# Patient Record
Sex: Female | Born: 1940 | Race: White | Hispanic: No | Marital: Married | Smoking: Never smoker
Health system: Southern US, Community
[De-identification: ages and names within clinical notes are randomized; demographics above are authoritative.]

## PROBLEM LIST (undated history)

## (undated) DIAGNOSIS — L309 Dermatitis, unspecified: Secondary | ICD-10-CM

## (undated) DIAGNOSIS — J45909 Unspecified asthma, uncomplicated: Secondary | ICD-10-CM

## (undated) DIAGNOSIS — M1990 Unspecified osteoarthritis, unspecified site: Secondary | ICD-10-CM

## (undated) DIAGNOSIS — R32 Unspecified urinary incontinence: Secondary | ICD-10-CM

## (undated) DIAGNOSIS — R87629 Unspecified abnormal cytological findings in specimens from vagina: Secondary | ICD-10-CM

## (undated) DIAGNOSIS — N952 Postmenopausal atrophic vaginitis: Secondary | ICD-10-CM

## (undated) DIAGNOSIS — E039 Hypothyroidism, unspecified: Secondary | ICD-10-CM

## (undated) DIAGNOSIS — G4730 Sleep apnea, unspecified: Secondary | ICD-10-CM

## (undated) DIAGNOSIS — F329 Major depressive disorder, single episode, unspecified: Secondary | ICD-10-CM

## (undated) DIAGNOSIS — E785 Hyperlipidemia, unspecified: Secondary | ICD-10-CM

## (undated) DIAGNOSIS — J449 Chronic obstructive pulmonary disease, unspecified: Secondary | ICD-10-CM

## (undated) DIAGNOSIS — I10 Essential (primary) hypertension: Secondary | ICD-10-CM

## (undated) DIAGNOSIS — K219 Gastro-esophageal reflux disease without esophagitis: Secondary | ICD-10-CM

## (undated) DIAGNOSIS — N950 Postmenopausal bleeding: Secondary | ICD-10-CM

## (undated) DIAGNOSIS — F32A Depression, unspecified: Secondary | ICD-10-CM

## (undated) HISTORY — DX: Unspecified osteoarthritis, unspecified site: M19.90

## (undated) HISTORY — DX: Unspecified abnormal cytological findings in specimens from vagina: R87.629

## (undated) HISTORY — DX: Depression, unspecified: F32.A

## (undated) HISTORY — DX: Unspecified urinary incontinence: R32

## (undated) HISTORY — PX: CHOLECYSTECTOMY: SHX55

## (undated) HISTORY — DX: Hyperlipidemia, unspecified: E78.5

## (undated) HISTORY — DX: Dermatitis, unspecified: L30.9

## (undated) HISTORY — DX: Major depressive disorder, single episode, unspecified: F32.9

## (undated) HISTORY — DX: Unspecified asthma, uncomplicated: J45.909

## (undated) HISTORY — PX: UMBILICAL HERNIA REPAIR: SHX196

## (undated) HISTORY — DX: Postmenopausal atrophic vaginitis: N95.2

## (undated) HISTORY — DX: Postmenopausal bleeding: N95.0

## (undated) HISTORY — DX: Chronic obstructive pulmonary disease, unspecified: J44.9

## (undated) HISTORY — DX: Essential (primary) hypertension: I10

## (undated) HISTORY — DX: Morbid (severe) obesity due to excess calories: E66.01

## (undated) HISTORY — DX: Hypothyroidism, unspecified: E03.9

## (undated) HISTORY — PX: DILATION AND CURETTAGE OF UTERUS: SHX78

## (undated) HISTORY — DX: Sleep apnea, unspecified: G47.30

## (undated) HISTORY — DX: Gastro-esophageal reflux disease without esophagitis: K21.9

---
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
## 2001-06-12 ENCOUNTER — Ambulatory Visit (HOSPITAL_COMMUNITY): Admission: RE | Admit: 2001-06-12 | Discharge: 2001-06-12 | Payer: Self-pay | Admitting: Gastroenterology

## 2003-12-05 ENCOUNTER — Ambulatory Visit: Payer: Self-pay | Admitting: Internal Medicine

## 2004-02-24 ENCOUNTER — Ambulatory Visit: Payer: Self-pay | Admitting: Internal Medicine

## 2004-03-06 ENCOUNTER — Ambulatory Visit: Payer: Self-pay | Admitting: Internal Medicine

## 2005-03-25 ENCOUNTER — Emergency Department: Payer: Self-pay | Admitting: General Practice

## 2005-04-25 ENCOUNTER — Encounter: Payer: Self-pay | Admitting: Physician Assistant

## 2005-05-04 ENCOUNTER — Encounter: Payer: Self-pay | Admitting: Physician Assistant

## 2005-05-08 ENCOUNTER — Emergency Department: Payer: Self-pay | Admitting: Emergency Medicine

## 2005-05-18 ENCOUNTER — Ambulatory Visit: Payer: Self-pay | Admitting: Internal Medicine

## 2005-08-11 ENCOUNTER — Ambulatory Visit: Payer: Self-pay | Admitting: Unknown Physician Specialty

## 2005-08-11 LAB — HM COLONOSCOPY

## 2006-08-30 ENCOUNTER — Ambulatory Visit: Payer: Self-pay | Admitting: Internal Medicine

## 2007-09-13 ENCOUNTER — Ambulatory Visit: Payer: Self-pay | Admitting: Internal Medicine

## 2008-11-04 ENCOUNTER — Ambulatory Visit: Payer: Self-pay | Admitting: Internal Medicine

## 2009-12-03 ENCOUNTER — Ambulatory Visit: Payer: Self-pay | Admitting: Internal Medicine

## 2009-12-03 LAB — HM MAMMOGRAPHY

## 2011-02-21 ENCOUNTER — Ambulatory Visit: Payer: Self-pay | Admitting: Internal Medicine

## 2011-08-29 LAB — LIPID PANEL
Cholesterol: 167 mg/dL (ref 0–200)
HDL: 48 mg/dL (ref 35–70)
LDL Cholesterol: 95 mg/dL
Triglycerides: 121 mg/dL (ref 40–160)

## 2011-08-29 LAB — HEPATIC FUNCTION PANEL
ALT: 18 U/L (ref 7–35)
AST: 20 U/L (ref 13–35)
Alkaline Phosphatase: 58 U/L (ref 25–125)
Bilirubin, Total: 0.9 mg/dL

## 2011-08-29 LAB — BASIC METABOLIC PANEL
Creatinine: 1 mg/dL (ref <=1.1)
Glucose: 107 mg/dL
Potassium: 4.3 mmol/L (ref 3.4–5.3)

## 2011-09-18 ENCOUNTER — Observation Stay: Payer: Self-pay | Admitting: Internal Medicine

## 2011-09-18 LAB — URINALYSIS, COMPLETE
Bilirubin,UR: NEGATIVE
Blood: NEGATIVE
Glucose,UR: NEGATIVE mg/dL (ref 0–75)
Ketone: NEGATIVE
Nitrite: POSITIVE
Ph: 7 (ref 4.5–8.0)
Protein: NEGATIVE
RBC,UR: 1 /HPF (ref 0–5)
Specific Gravity: 1.005 (ref 1.003–1.030)
Squamous Epithelial: 3
WBC UR: 27 /HPF (ref 0–5)

## 2011-09-18 LAB — BASIC METABOLIC PANEL
Anion Gap: 13 (ref 7–16)
BUN: 11 mg/dL (ref 7–18)
Calcium, Total: 9.5 mg/dL (ref 8.5–10.1)
Chloride: 103 mmol/L (ref 98–107)
Co2: 26 mmol/L (ref 21–32)
Creatinine: 0.98 mg/dL (ref 0.60–1.30)
EGFR (African American): >60
EGFR (Non-African Amer.): 58 — ABNORMAL LOW
Glucose: 104 mg/dL — ABNORMAL HIGH (ref 65–99)
Osmolality: 283 (ref 275–301)
Potassium: 3.9 mmol/L (ref 3.5–5.1)
Sodium: 142 mmol/L (ref 136–145)

## 2011-09-18 LAB — CBC
HCT: 38.8 % (ref 35.0–47.0)
HGB: 12.9 g/dL (ref 12.0–16.0)
MCH: 31.1 pg (ref 26.0–34.0)
MCHC: 33.3 g/dL (ref 32.0–36.0)
MCV: 93 fL (ref 80–100)
Platelet: 264 10*3/uL (ref 150–440)
RBC: 4.16 10*6/uL (ref 3.80–5.20)
RDW: 13.6 % (ref 11.5–14.5)
WBC: 9.9 10*3/uL (ref 3.6–11.0)

## 2011-09-18 LAB — TSH: Thyroid Stimulating Horm: 2.94 u[IU]/mL

## 2011-09-19 LAB — BASIC METABOLIC PANEL
Anion Gap: 7 (ref 7–16)
BUN: 10 mg/dL (ref 7–18)
Calcium, Total: 8.7 mg/dL (ref 8.5–10.1)
Chloride: 105 mmol/L (ref 98–107)
Co2: 29 mmol/L (ref 21–32)
Creatinine: 1.15 mg/dL (ref 0.60–1.30)
EGFR (African American): 56 — ABNORMAL LOW
EGFR (Non-African Amer.): 48 — ABNORMAL LOW
Glucose: 104 mg/dL — ABNORMAL HIGH (ref 65–99)
Osmolality: 281 (ref 275–301)
Potassium: 3.8 mmol/L (ref 3.5–5.1)
Sodium: 141 mmol/L (ref 136–145)

## 2011-09-19 LAB — CBC WITH DIFFERENTIAL/PLATELET
Basophil #: 0.1 10*3/uL (ref 0.0–0.1)
Basophil %: 0.6 %
Eosinophil #: 0.6 10*3/uL (ref 0.0–0.7)
Eosinophil %: 5 %
HCT: 39 % (ref 35.0–47.0)
HGB: 12.6 g/dL (ref 12.0–16.0)
Lymphocyte #: 1.1 10*3/uL (ref 1.0–3.6)
Lymphocyte %: 9.1 %
MCH: 30.5 pg (ref 26.0–34.0)
MCHC: 32.3 g/dL (ref 32.0–36.0)
MCV: 94 fL (ref 80–100)
Monocyte #: 0.8 x10 3/mm (ref 0.2–0.9)
Monocyte %: 6.7 %
Neutrophil #: 9.4 10*3/uL — ABNORMAL HIGH (ref 1.4–6.5)
Neutrophil %: 78.6 %
Platelet: 249 10*3/uL (ref 150–440)
RBC: 4.13 10*6/uL (ref 3.80–5.20)
RDW: 13.6 % (ref 11.5–14.5)
WBC: 12 10*3/uL — ABNORMAL HIGH (ref 3.6–11.0)

## 2011-09-24 LAB — CULTURE, BLOOD (SINGLE)

## 2012-01-27 ENCOUNTER — Telehealth: Payer: Self-pay | Admitting: Internal Medicine

## 2012-01-27 MED ORDER — AMLODIPINE BESYLATE 5 MG PO TABS: 5.0000 mg | ORAL_TABLET | Freq: Every day | ORAL | Status: DC

## 2012-01-27 NOTE — Telephone Encounter (Signed)
rx sent to [facility] for amlodipine 5mg  #90 with 1 refill

## 2012-02-15 ENCOUNTER — Telehealth: Payer: Self-pay | Admitting: Internal Medicine

## 2012-02-15 NOTE — Telephone Encounter (Signed)
Refill request for furosemide 20 mg tab #90 Sig: take 1 tablet by mouth once daily Patient has an appt. For 04/16/12

## 2012-02-16 ENCOUNTER — Other Ambulatory Visit: Payer: Self-pay | Admitting: *Deleted

## 2012-02-16 MED ORDER — FUROSEMIDE 20 MG PO TABS: 20.0000 mg | ORAL_TABLET | Freq: Every day | ORAL | Status: DC

## 2012-02-16 NOTE — Telephone Encounter (Signed)
Pt called stating that she needs her refill for Lasix called into [facility], she only has one pill left.

## 2012-02-16 NOTE — Telephone Encounter (Signed)
Furosemide 20 mg Tab #90 Take one tablet po once daily

## 2012-02-16 NOTE — Telephone Encounter (Signed)
Dr.Daven Montz prescribed both of these medications. Spoke with patient and advised patient to call Dr. Garen Lah office.

## 2012-02-16 NOTE — Telephone Encounter (Signed)
Sent in to pharmacy.  

## 2012-02-29 ENCOUNTER — Other Ambulatory Visit: Payer: Self-pay | Admitting: Internal Medicine

## 2012-02-29 MED ORDER — POTASSIUM CHLORIDE CRYS ER 20 MEQ PO TBCR: 20.0000 meq | EXTENDED_RELEASE_TABLET | Freq: Every day | ORAL | Status: DC

## 2012-02-29 NOTE — Telephone Encounter (Signed)
Pt is needing refill on her Potassium sent into [facility]. Best contact number is [phone]

## 2012-02-29 NOTE — Telephone Encounter (Signed)
Refill request for potassium chloride 20 meq ter #30 Sig: take 1 tablet by mouth once daily with furosemide.

## 2012-02-29 NOTE — Telephone Encounter (Signed)
Sent in to pharmacy.  

## 2012-03-06 HISTORY — PX: ABDOMINAL HYSTERECTOMY: SHX81

## 2012-03-13 ENCOUNTER — Telehealth: Payer: Self-pay | Admitting: Internal Medicine

## 2012-03-13 ENCOUNTER — Other Ambulatory Visit: Payer: Self-pay

## 2012-03-13 LAB — POCT URINALYSIS DIPSTICK
Bilirubin, UA: NEGATIVE
Blood, UA: NEGATIVE
Glucose, UA: NEGATIVE
Ketones, UA: NEGATIVE
Leukocytes, UA: NEGATIVE
Nitrite, UA: NEGATIVE
Protein, UA: NEGATIVE
Spec Grav, UA: <=1.005
Urobilinogen, UA: 0.2
pH, UA: 5.5

## 2012-03-13 NOTE — Telephone Encounter (Signed)
Patient notified that she will need to go to an urgent care to be seen if she thinks that she has a UTI per Dr. Lorin Picket.I. Advised patient that she will need to get established at the office before she can be treated for any medical conditions. Advised patient that the office policy is that if patient's think that they have a UTI they will need to be seen and can not drop off a urine specimen.  Patient states that she has an upcoming appointment scheduled and she was encouraged to keep that appointment.

## 2012-03-13 NOTE — Telephone Encounter (Signed)
Patient Information:  Caller Name: Leydy  Phone: [phone]  Patient: [name], [name]  Gender: Female  DOB: 1940/05/11  Age: 72 Years  PCP: Dale Wilson  Office Follow Up:  Does the office need to follow up with this patient?: No  Instructions For The Office: N/A  RN Note:  caller states she cannot come into the office; but she will have her husband bring by a urine specimen (pt has sterile urine container at home)  Symptoms  Reason For Call & Symptoms: caller states she has dark urine and it burns/painful when she urinates  Reviewed Health History In EMR: Yes  Reviewed Medications In EMR: Yes  Reviewed Allergies In EMR: Yes  Reviewed Surgeries / Procedures: Yes  Date of Onset of Symptoms: 02/11/2012  Guideline(s) Used:  Urination Pain - Female  Disposition Per Guideline:   See Today in Office  Reason For Disposition Reached:   Age > 50 years  Advice Given:  Fluids:   Drink extra fluids. Drink 8-10 glasses of liquids a day. (Reason: to produce a dilute, non-irritating urine)  Cranberry Juice:   Some people think that drinking cranberry juice may help in fighting urinary tract infections. However, there is no good research that has ever proved this.  Call Back If:   You become worse.  Patient Refused Recommendation:  Patient Refused Care Advice  caller cannot come into office; spouse will bring urine specimen by today (03/13/12)

## 2012-03-13 NOTE — Telephone Encounter (Signed)
Forward to Dr. Scott 

## 2012-03-13 NOTE — Telephone Encounter (Signed)
Please call pt and discuss what we discussed

## 2012-03-18 ENCOUNTER — Telehealth: Payer: Self-pay | Admitting: Internal Medicine

## 2012-03-18 NOTE — Telephone Encounter (Signed)
Results from urine specimen.

## 2012-03-20 NOTE — Telephone Encounter (Signed)
Do we have urine specimen results for this patient? Please advise?

## 2012-03-20 NOTE — Telephone Encounter (Signed)
Left message on machine to call back  

## 2012-03-20 NOTE — Telephone Encounter (Signed)
This is the pt you spoke to last week about needing to go to urgent care for evaluation because she had not established here.  You also explained to her that we did not treat over the phone without seeing a pt.  Please inform her again.  I am now sure why she is calling for results.  Thanks.

## 2012-03-21 NOTE — Telephone Encounter (Signed)
Spoke to patient and reminded her of the conversation last week that we could not do a urine test on her since she has not establish here per Dr. Lorin Picket. Patient stated that she was confused because when her husband dropped off the urine it was accepted. Advised patient that the urine was dropped off before our conversation. Patient stated that she will go to an urgent care for treatment. Advised patient to keep her upcoming appointment to get established. Patient was added to a waiting list by Asher Muir T. In case Dr. Lorin Picket has a cancellation and patient can be seen sooner.

## 2012-04-11 ENCOUNTER — Telehealth: Payer: Self-pay | Admitting: Internal Medicine

## 2012-04-11 NOTE — Telephone Encounter (Signed)
Patient wants to know if she should fast on her appointment date 2.11.14  in order to have blood work done.

## 2012-04-12 ENCOUNTER — Encounter: Payer: Self-pay | Admitting: *Deleted

## 2012-04-15 ENCOUNTER — Telehealth: Payer: Self-pay | Admitting: Internal Medicine

## 2012-04-15 NOTE — Telephone Encounter (Signed)
Patient wants to know if she should fast before coming to her appointment on 2.11.14.

## 2012-04-15 NOTE — Telephone Encounter (Signed)
I looked in patient's chart but I did not see where she has labs due.

## 2012-04-16 ENCOUNTER — Ambulatory Visit (INDEPENDENT_AMBULATORY_CARE_PROVIDER_SITE_OTHER): Payer: Medicare Other | Admitting: Internal Medicine

## 2012-04-16 ENCOUNTER — Encounter: Payer: Self-pay | Admitting: Internal Medicine

## 2012-04-16 VITALS — BP 130/74 | HR 73 | Temp 98.2°F | Ht 66.0 in | Wt 367.0 lb

## 2012-04-16 DIAGNOSIS — K5730 Diverticulosis of large intestine without perforation or abscess without bleeding: Secondary | ICD-10-CM

## 2012-04-16 DIAGNOSIS — N3281 Overactive bladder: Secondary | ICD-10-CM

## 2012-04-16 DIAGNOSIS — N318 Other neuromuscular dysfunction of bladder: Secondary | ICD-10-CM

## 2012-04-16 DIAGNOSIS — R300 Dysuria: Secondary | ICD-10-CM

## 2012-04-16 DIAGNOSIS — I10 Essential (primary) hypertension: Secondary | ICD-10-CM

## 2012-04-16 DIAGNOSIS — R5381 Other malaise: Secondary | ICD-10-CM

## 2012-04-16 DIAGNOSIS — C541 Malignant neoplasm of endometrium: Secondary | ICD-10-CM

## 2012-04-16 DIAGNOSIS — E7800 Pure hypercholesterolemia, unspecified: Secondary | ICD-10-CM

## 2012-04-16 DIAGNOSIS — J45909 Unspecified asthma, uncomplicated: Secondary | ICD-10-CM

## 2012-04-16 DIAGNOSIS — C549 Malignant neoplasm of corpus uteri, unspecified: Secondary | ICD-10-CM

## 2012-04-16 DIAGNOSIS — G4733 Obstructive sleep apnea (adult) (pediatric): Secondary | ICD-10-CM

## 2012-04-16 DIAGNOSIS — I050 Rheumatic mitral stenosis: Secondary | ICD-10-CM

## 2012-04-16 DIAGNOSIS — E039 Hypothyroidism, unspecified: Secondary | ICD-10-CM

## 2012-04-16 LAB — URINALYSIS, ROUTINE W REFLEX MICROSCOPIC
Bilirubin Urine: NEGATIVE
Hgb urine dipstick: NEGATIVE
Ketones, ur: NEGATIVE
Leukocytes, UA: NEGATIVE
Nitrite: NEGATIVE
Specific Gravity, Urine: 1.02 (ref 1.000–1.030)
Total Protein, Urine: NEGATIVE
Urine Glucose: NEGATIVE
Urobilinogen, UA: 0.2 (ref 0.0–1.0)
pH: 6 (ref 5.0–8.0)

## 2012-04-16 LAB — CBC WITH DIFFERENTIAL/PLATELET
Basophils Absolute: 0.1 10*3/uL (ref 0.0–0.1)
Basophils Relative: 0.9 % (ref 0.0–3.0)
Eosinophils Absolute: 0.1 10*3/uL (ref 0.0–0.7)
Eosinophils Relative: 1.1 % (ref 0.0–5.0)
HCT: 41.3 % (ref 36.0–46.0)
Hemoglobin: 14.1 g/dL (ref 12.0–15.0)
Lymphocytes Relative: 16.2 % (ref 12.0–46.0)
Lymphs Abs: 1.3 10*3/uL (ref 0.7–4.0)
MCHC: 34 g/dL (ref 30.0–36.0)
MCV: 92.1 fl (ref 78.0–100.0)
Monocytes Absolute: 0.4 10*3/uL (ref 0.1–1.0)
Monocytes Relative: 4.9 % (ref 3.0–12.0)
Neutro Abs: 6.4 10*3/uL (ref 1.4–7.7)
Neutrophils Relative %: 76.9 % (ref 43.0–77.0)
Platelets: 290 10*3/uL (ref 150.0–400.0)
RBC: 4.49 Mil/uL (ref 3.87–5.11)
RDW: 13.4 % (ref 11.5–14.6)
WBC: 8.3 10*3/uL (ref 4.5–10.5)

## 2012-04-16 LAB — COMPREHENSIVE METABOLIC PANEL
ALT: 19 U/L (ref 0–35)
AST: 23 U/L (ref 0–37)
Albumin: 4.3 g/dL (ref 3.5–5.2)
Alkaline Phosphatase: 63 U/L (ref 39–117)
BUN: 18 mg/dL (ref 6–23)
CO2: 29 mEq/L (ref 19–32)
Calcium: 10.2 mg/dL (ref 8.4–10.5)
Chloride: 101 mEq/L (ref 96–112)
Creatinine, Ser: 1.1 mg/dL (ref 0.4–1.2)
GFR: 53.09 mL/min — ABNORMAL LOW (ref >60.00)
Glucose, Bld: 106 mg/dL — ABNORMAL HIGH (ref 70–99)
Potassium: 5.2 mEq/L — ABNORMAL HIGH (ref 3.5–5.1)
Sodium: 137 mEq/L (ref 135–145)
Total Bilirubin: 1.1 mg/dL (ref 0.3–1.2)
Total Protein: 8 g/dL (ref 6.0–8.3)

## 2012-04-16 LAB — LIPID PANEL
Cholesterol: 182 mg/dL (ref 0–200)
HDL: 49.5 mg/dL (ref >39.00)
LDL Cholesterol: 98 mg/dL (ref 0–99)
Total CHOL/HDL Ratio: 4
Triglycerides: 175 mg/dL — ABNORMAL HIGH (ref 0.0–149.0)
VLDL: 35 mg/dL (ref 0.0–40.0)

## 2012-04-16 LAB — TSH: TSH: 3.31 u[IU]/mL (ref 0.35–5.50)

## 2012-04-16 MED ORDER — MOMETASONE FUROATE 220 MCG/INH IN AEPB: 1.0000 | INHALATION_SPRAY | Freq: Two times a day (BID) | RESPIRATORY_TRACT | Status: DC

## 2012-04-17 ENCOUNTER — Other Ambulatory Visit: Payer: Self-pay | Admitting: Internal Medicine

## 2012-04-17 DIAGNOSIS — R739 Hyperglycemia, unspecified: Secondary | ICD-10-CM

## 2012-04-17 DIAGNOSIS — E875 Hyperkalemia: Secondary | ICD-10-CM

## 2012-04-17 NOTE — Progress Notes (Signed)
Orders placed for follow up labs 

## 2012-04-18 ENCOUNTER — Encounter: Payer: Self-pay | Admitting: Internal Medicine

## 2012-04-18 DIAGNOSIS — E039 Hypothyroidism, unspecified: Secondary | ICD-10-CM | POA: Insufficient documentation

## 2012-04-18 DIAGNOSIS — I350 Nonrheumatic aortic (valve) stenosis: Secondary | ICD-10-CM | POA: Insufficient documentation

## 2012-04-18 DIAGNOSIS — C541 Malignant neoplasm of endometrium: Secondary | ICD-10-CM | POA: Insufficient documentation

## 2012-04-18 DIAGNOSIS — E7800 Pure hypercholesterolemia, unspecified: Secondary | ICD-10-CM | POA: Insufficient documentation

## 2012-04-18 DIAGNOSIS — K5730 Diverticulosis of large intestine without perforation or abscess without bleeding: Secondary | ICD-10-CM | POA: Insufficient documentation

## 2012-04-18 DIAGNOSIS — J45909 Unspecified asthma, uncomplicated: Secondary | ICD-10-CM | POA: Insufficient documentation

## 2012-04-18 DIAGNOSIS — I10 Essential (primary) hypertension: Secondary | ICD-10-CM | POA: Insufficient documentation

## 2012-04-18 DIAGNOSIS — N3281 Overactive bladder: Secondary | ICD-10-CM | POA: Insufficient documentation

## 2012-04-18 DIAGNOSIS — G4733 Obstructive sleep apnea (adult) (pediatric): Secondary | ICD-10-CM | POA: Insufficient documentation

## 2012-04-18 NOTE — Assessment & Plan Note (Signed)
S/p hysterectomy.  Overdue gyn follow up.  Plans to make her appt with Dr Toya Smothers.

## 2012-04-18 NOTE — Assessment & Plan Note (Signed)
ECHO 09/02/08 revealed normal LV function with EF 55-60%, mild stenotic aortic valve.  Mildly elevated right sided  Heart pressure.  Treat sleep apnea.

## 2012-04-18 NOTE — Progress Notes (Signed)
Subjective:    Patient ID: [name], female    DOB: December 08, 1940, 72 y.o.   MRN: [id number]  HPI 72 year old female with past history of hypertension, hypothyroidism, hypercholesterolemia, endometrial cancer and asthma who comes in today for a scheduled follow up.  She states she is doing relatively well.  She underwent surgery for endometrial cancer.  Do not have records.  States she is doing well.  Overdue follow up with gyn.  Saw Dr Kyla Balzarine - did her surgery.  Plans to follow up with Dr Toya Smothers.  Noticing some incontinence.  3.5 weeks ago, went to [facility] [employer].  Dx with a uti.  Treated with cipro.  Helped.  Feels she may have some return of symptoms.  Her niece is wrapping her legs.  Swelling is better.  Does report some increased drainage.  Some congestion in her chest.  Has asthma.  Not using inhalers regularly.  Overall, things appear to be stable.    Past Medical History  Diagnosis Date  . Hypothyroidism   . Hypertension   . Hyperlipidemia   . Sleep apnea   . Morbid obesity   . Osteoarthritis   . Asthma   . Osteoarthritis     Current Outpatient Prescriptions on File Prior to Visit  Medication Sig Dispense Refill  . albuterol (PROVENTIL) (2.5 MG/3ML) 0.083% nebulizer solution Take 2.5 mg by nebulization every 6 (six) hours as needed.      Marland Kitchen amLODipine (NORVASC) 5 MG tablet Take 1 tablet (5 mg total) by mouth daily.  90 tablet  1  . aspirin 81 MG tablet Take 81 mg by mouth daily.      Marland Kitchen atorvastatin (LIPITOR) 20 MG tablet Take 20 mg by mouth daily.      . Calcium Carbonate-Vitamin D (CALCIUM 600 + D PO) Take by mouth 2 (two) times daily.       . furosemide (LASIX) 20 MG tablet Take 1 tablet (20 mg total) by mouth daily.  90 tablet  0  . Garlic 1000 MG CAPS Take by mouth.      . levothyroxine (SYNTHROID, LEVOTHROID) 100 MCG tablet Take 100 mcg by mouth daily.      . Misc Natural Products (OSTEO BI-FLEX JOINT SHIELD PO) Take by mouth. Use as directed      . potassium  chloride SA (K-DUR,KLOR-CON) 20 MEQ tablet Take 1 tablet (20 mEq total) by mouth daily.  30 tablet  1  . albuterol (PROVENTIL HFA;VENTOLIN HFA) 108 (90 BASE) MCG/ACT inhaler Inhale 2 puffs into the lungs every 6 (six) hours as needed.      . fluticasone (FLONASE) 50 MCG/ACT nasal spray Place 2 sprays into the nose daily.      . potassium chloride (KLOR-CON) 20 MEQ packet Take 20 mEq by mouth daily.       . solifenacin (VESICARE) 5 MG tablet Take 10 mg by mouth daily.       No current facility-administered medications on file prior to visit.    Review of Systems Patient denies any headache, lightheadedness or dizziness.  No sinus pressure or nasal congestion.  No chest pain, tightness or palpitations.  No increased shortness of breath.  Some cough and congestion.   No nausea or vomiting.  No abdominal pain or cramping.  No bowel change, such as diarrhea, constipation, BRBPR or melana.  Urine change as outlined.  Discussed the need to get up and move around.        Objective:   Physical Exam  Filed Vitals:   04/16/12 1051  BP: 130/74  Pulse: 73  Temp: 98.2 F (52.50 C)   72 year old female in no acute distress.   HEENT:  Nares- clear.  Oropharynx - without lesions. NECK:  Supple.  Nontender.  No audible bruit.  HEART:  Appears to be regular. LUNGS:  No crackles or wheezing audible.  Respirations even and unlabored.  RADIAL PULSE:  Equal bilaterally.  ABDOMEN:  Soft, nontender.  Bowel sounds present and normal.  No audible abdominal bruit.  EXTREMITIES:  No increased edema present.  DP pulses palpable and equal bilaterally.           Assessment & Plan:  FATIGUE.  Check cbc, met c and tsh.    HEALTH MAINTENANCE.  Breast, pelvic and paps through gyn.  Need to schedule mammogram.  Colonoscopy 08/11/05 revealed one 5mm polyp in the ascending colon with internal hemorrhoid and a 10mm polyp in the rectum and diverticulosis.  Overdue colonoscopy.  She was referred to GI.  They had wanted her to  lose weight prior to her procedure.  She declines further testing now.

## 2012-04-18 NOTE — Assessment & Plan Note (Signed)
Low cholesterol diet.  On lipitor.  Follow lipid panel and liver function.

## 2012-04-18 NOTE — Assessment & Plan Note (Signed)
Stable.  Asymptomatic.  Declines colonoscopy.  See previous notes.

## 2012-04-18 NOTE — Assessment & Plan Note (Signed)
Check urine to confirm no infection.  

## 2012-04-18 NOTE — Assessment & Plan Note (Signed)
Needs to use her CPAP

## 2012-04-18 NOTE — Assessment & Plan Note (Signed)
Has seen Dr Meredeth Ide.  He had recommended a follow up chest CT.  She is overdue.  She has declined further scanning.  Lungs clear.  Restart Advair.  Use the albuterol inhaler if needed.  Follow.

## 2012-04-18 NOTE — Assessment & Plan Note (Signed)
Hypercholesterolemia

## 2012-04-18 NOTE — Assessment & Plan Note (Signed)
On thyroid replacement.  Check tsh.  

## 2012-04-22 ENCOUNTER — Other Ambulatory Visit: Payer: Self-pay | Admitting: Internal Medicine

## 2012-04-22 DIAGNOSIS — R3915 Urgency of urination: Secondary | ICD-10-CM

## 2012-04-22 NOTE — Progress Notes (Signed)
Order placed for follow up urine check

## 2012-04-25 ENCOUNTER — Other Ambulatory Visit (INDEPENDENT_AMBULATORY_CARE_PROVIDER_SITE_OTHER): Payer: Medicare Other

## 2012-04-25 DIAGNOSIS — R3915 Urgency of urination: Secondary | ICD-10-CM

## 2012-04-25 DIAGNOSIS — E875 Hyperkalemia: Secondary | ICD-10-CM

## 2012-04-25 DIAGNOSIS — R7309 Other abnormal glucose: Secondary | ICD-10-CM

## 2012-04-25 DIAGNOSIS — R739 Hyperglycemia, unspecified: Secondary | ICD-10-CM

## 2012-04-25 LAB — URINALYSIS, ROUTINE W REFLEX MICROSCOPIC
Bilirubin Urine: NEGATIVE
Hgb urine dipstick: NEGATIVE
Ketones, ur: NEGATIVE
Leukocytes, UA: NEGATIVE
Nitrite: NEGATIVE
Specific Gravity, Urine: 1.015 (ref 1.000–1.030)
Total Protein, Urine: NEGATIVE
Urine Glucose: NEGATIVE
Urobilinogen, UA: 0.2 (ref 0.0–1.0)
pH: 6 (ref 5.0–8.0)

## 2012-04-25 LAB — HEMOGLOBIN A1C: Hgb A1c MFr Bld: 6.1 % (ref 4.6–6.5)

## 2012-04-25 LAB — GLUCOSE, RANDOM: Glucose, Bld: 101 mg/dL — ABNORMAL HIGH (ref 70–99)

## 2012-04-25 LAB — POTASSIUM: Potassium: 4.3 mEq/L (ref 3.5–5.1)

## 2012-04-27 ENCOUNTER — Telehealth: Payer: Self-pay | Admitting: Internal Medicine

## 2012-04-27 LAB — CULTURE, URINE COMPREHENSIVE: Colony Count: >=100000

## 2012-04-27 MED ORDER — LEVOTHYROXINE SODIUM 100 MCG PO TABS: 100.0000 ug | ORAL_TABLET | Freq: Every day | ORAL | Status: DC

## 2012-04-27 NOTE — Telephone Encounter (Signed)
Order placed for refill on levothyroxine

## 2012-04-30 ENCOUNTER — Telehealth: Payer: Self-pay | Admitting: Internal Medicine

## 2012-04-30 MED ORDER — POTASSIUM CHLORIDE CRYS ER 20 MEQ PO TBCR: 20.0000 meq | EXTENDED_RELEASE_TABLET | Freq: Every day | ORAL | Status: DC

## 2012-04-30 MED ORDER — CEFUROXIME AXETIL 250 MG PO TABS: 250.0000 mg | ORAL_TABLET | Freq: Two times a day (BID) | ORAL | Status: DC

## 2012-04-30 NOTE — Telephone Encounter (Signed)
Informed patient of lab results and rx sent to pharmacy on file.

## 2012-04-30 NOTE — Telephone Encounter (Signed)
Message copied by Theola Sequin on Tue Apr 30, 2012  4:38 PM ------      Message from: Charm Barges      Created: Fri Apr 26, 2012  6:00 AM       Notify pt urinalysis looks good.  Potassium is normal.  Overall sugar control is just slightly elevated.  Low carb diet, exercise and weight loss.  Will follow. ------

## 2012-04-30 NOTE — Telephone Encounter (Signed)
Patient returning your call .[phone]

## 2012-06-19 NOTE — Telephone Encounter (Signed)
Please close encounter

## 2012-06-30 ENCOUNTER — Telehealth: Payer: Self-pay | Admitting: Internal Medicine

## 2012-06-30 MED ORDER — POTASSIUM CHLORIDE CRYS ER 20 MEQ PO TBCR: 20.0000 meq | EXTENDED_RELEASE_TABLET | Freq: Every day | ORAL | Status: DC

## 2012-06-30 NOTE — Telephone Encounter (Signed)
Refilled potassium #30 with one refill.

## 2012-07-17 ENCOUNTER — Other Ambulatory Visit: Payer: Self-pay | Admitting: *Deleted

## 2012-07-18 MED ORDER — ATORVASTATIN CALCIUM 20 MG PO TABS: 20.0000 mg | ORAL_TABLET | Freq: Every day | ORAL | Status: DC

## 2012-07-29 ENCOUNTER — Other Ambulatory Visit: Payer: Self-pay | Admitting: Internal Medicine

## 2012-07-29 MED ORDER — POTASSIUM CHLORIDE CRYS ER 20 MEQ PO TBCR: 20.0000 meq | EXTENDED_RELEASE_TABLET | Freq: Every day | ORAL | Status: DC

## 2012-07-29 MED ORDER — AMLODIPINE BESYLATE 5 MG PO TABS: 5.0000 mg | ORAL_TABLET | Freq: Every day | ORAL | Status: DC

## 2012-07-29 NOTE — Progress Notes (Signed)
Refilled amlodipine #90 with one refill and potassium #30 with one refill.  Needs a f/u appt.

## 2012-08-11 ENCOUNTER — Other Ambulatory Visit: Payer: Self-pay | Admitting: Internal Medicine

## 2012-08-11 MED ORDER — FUROSEMIDE 20 MG PO TABS: 20.0000 mg | ORAL_TABLET | Freq: Every day | ORAL | Status: DC

## 2012-08-11 NOTE — Progress Notes (Signed)
Refilled lasix #90 with no refills.

## 2012-08-13 ENCOUNTER — Telehealth: Payer: Self-pay | Admitting: *Deleted

## 2012-08-13 NOTE — Telephone Encounter (Signed)
Pt received a letter from her [facility] stating that she was eligible for a free in-home visit/physical at no cost to her. The letter mentioned that a physician & nurse would complete the exam & would sent the report to her primary care physician. Pt wanted to know how you feel about it. Does it seem reasonable. She has already had a physical with you & was wondering if this is legit.  FYI: Has also been seen at [facility] smear revealed abnormal cell  Had 2 molds removed by Dr. Orson Aloe last week-hasn't heard the results yet & plan to call them tomorrow.

## 2012-08-13 NOTE — Telephone Encounter (Signed)
Patient left a message on voicemail stating she would like someone to call her back.

## 2012-08-13 NOTE — Telephone Encounter (Signed)
Given her history, I feel she needs the gyn exam and her gyn.  It appears she is doing this. If she decides to have them come out, we will still need to have f/u office visit here in the office.  This would not take the place of this.

## 2012-08-14 NOTE — Telephone Encounter (Signed)
Pt informed

## 2012-09-23 ENCOUNTER — Other Ambulatory Visit: Payer: Self-pay | Admitting: *Deleted

## 2012-09-24 MED ORDER — POTASSIUM CHLORIDE CRYS ER 20 MEQ PO TBCR: 20.0000 meq | EXTENDED_RELEASE_TABLET | Freq: Every day | ORAL | Status: DC

## 2012-10-18 ENCOUNTER — Telehealth: Payer: Self-pay | Admitting: Internal Medicine

## 2012-10-18 NOTE — Telephone Encounter (Signed)
noted 

## 2012-10-18 NOTE — Telephone Encounter (Signed)
It is not that I mind doing the referral, but if they did the test she needs to contact them about appt.  I don't know who they were wanting her to see.

## 2012-10-18 NOTE — Telephone Encounter (Signed)
Spoke with [facility] & was informed that they faxed her records &  Letter to [facility] oncology (Dr. Ether Griffins) & have not heard back from them. She states that they wil give them a call today & call me back with an update. But they were not planning to refer her. They sent a letter to ask if she needs to see oncology or can she continue to see Dr. Toya Smothers and continue the follow-up pap every 4 months. I contacted the patient & explained this to her also.

## 2012-10-18 NOTE — Telephone Encounter (Signed)
Pt states she was told 2.5 weeks ago that she had an abnormal Pap with cancer cells.  Pt states she was seen at [facility] and has not heard back from them regarding a referral. Pt asking if Dr. Lorin Picket can refer her to an oncologist at the cancer center.  Asking for the nurse can contact her regarding this.

## 2012-10-21 ENCOUNTER — Other Ambulatory Visit: Payer: Self-pay | Admitting: *Deleted

## 2012-10-21 MED ORDER — LEVOTHYROXINE SODIUM 100 MCG PO TABS: 100.0000 ug | ORAL_TABLET | Freq: Every day | ORAL | Status: DC

## 2012-11-07 ENCOUNTER — Other Ambulatory Visit: Payer: Self-pay | Admitting: *Deleted

## 2012-11-07 MED ORDER — AMLODIPINE BESYLATE 5 MG PO TABS: 5.0000 mg | ORAL_TABLET | Freq: Every day | ORAL | Status: DC

## 2012-11-07 MED ORDER — FUROSEMIDE 20 MG PO TABS: 20.0000 mg | ORAL_TABLET | Freq: Every day | ORAL | Status: DC

## 2012-11-12 ENCOUNTER — Telehealth: Payer: Self-pay | Admitting: Internal Medicine

## 2012-11-12 NOTE — Telephone Encounter (Signed)
Pt notified that she needs to be seen in office to be treated for a UTI. I offered her an appt today with Raquel today at 1:30 since we have not seen her in about 7 months. (only seen once here for a NP appt in February). Pt states that she can not come in & hasnt been out of the house in quite a while. Has trouble even getting in husband truck. I mentioned to pt that she could see if her GYN doctor would be willing to call something in for her if she has seen them recently. Pt wasn't happy with the idea of [place] not allowing her to drop off a urine sample so she said nevermind & hung up.

## 2012-11-12 NOTE — Telephone Encounter (Signed)
The patient is wanting to send a urine sample over to have it tested and a antibiotic called into the pharmacy.

## 2012-11-12 NOTE — Telephone Encounter (Signed)
Noted  

## 2013-03-18 ENCOUNTER — Other Ambulatory Visit: Payer: Self-pay | Admitting: *Deleted

## 2013-03-21 MED ORDER — ATORVASTATIN CALCIUM 20 MG PO TABS: ORAL_TABLET | ORAL | Status: DC

## 2013-10-12 ENCOUNTER — Emergency Department: Payer: Self-pay | Admitting: Emergency Medicine

## 2013-10-12 LAB — COMPREHENSIVE METABOLIC PANEL
Albumin: 3.8 g/dL (ref 3.4–5.0)
Alkaline Phosphatase: 71 U/L
Anion Gap: 10 (ref 7–16)
BUN: 18 mg/dL (ref 7–18)
Bilirubin,Total: 0.7 mg/dL (ref 0.2–1.0)
Calcium, Total: 9.3 mg/dL (ref 8.5–10.1)
Chloride: 101 mmol/L (ref 98–107)
Co2: 28 mmol/L (ref 21–32)
Creatinine: 1.13 mg/dL (ref 0.60–1.30)
EGFR (African American): 56 — ABNORMAL LOW
EGFR (Non-African Amer.): 49 — ABNORMAL LOW
Glucose: 122 mg/dL — ABNORMAL HIGH (ref 65–99)
Osmolality: 281 (ref 275–301)
Potassium: 3.9 mmol/L (ref 3.5–5.1)
SGOT(AST): 25 U/L (ref 15–37)
SGPT (ALT): 26 U/L
Sodium: 139 mmol/L (ref 136–145)
Total Protein: 8 g/dL (ref 6.4–8.2)

## 2013-10-12 LAB — URINALYSIS, COMPLETE
Bilirubin,UR: NEGATIVE
Glucose,UR: NEGATIVE mg/dL (ref 0–75)
Ketone: NEGATIVE
Nitrite: NEGATIVE
Ph: 7 (ref 4.5–8.0)
Protein: NEGATIVE
RBC,UR: 1 /HPF (ref 0–5)
Specific Gravity: 1.003 (ref 1.003–1.030)
Squamous Epithelial: <1
WBC UR: 10 /HPF (ref 0–5)

## 2013-10-12 LAB — CBC
HCT: 41.3 % (ref 35.0–47.0)
HGB: 13.8 g/dL (ref 12.0–16.0)
MCH: 31.2 pg (ref 26.0–34.0)
MCHC: 33.4 g/dL (ref 32.0–36.0)
MCV: 94 fL (ref 80–100)
Platelet: 258 10*3/uL (ref 150–440)
RBC: 4.41 10*6/uL (ref 3.80–5.20)
RDW: 13.2 % (ref 11.5–14.5)
WBC: 12.4 10*3/uL — ABNORMAL HIGH (ref 3.6–11.0)

## 2013-11-06 ENCOUNTER — Emergency Department: Payer: Self-pay | Admitting: Student

## 2014-06-23 NOTE — Discharge Summary (Signed)
PATIENT NAME:  [name], [name] MR#:  [id number] DATE OF BIRTH:  [birth year]  DATE OF ADMISSION:  09/18/2011 DATE OF DISCHARGE:  09/20/2011  DISCHARGE DIAGNOSES:  1. Left lower extremity cellulitis, improving on current antibiotic.  2. Possible urinary tract infection, but urinalysis, urine culture is pending, symptoms already improved on antibiotic.   SECONDARY DIAGNOSES:  1. Hypertension.  2. Hypothyroidism.  3. Hyperlipidemia. 4. Asthma.    CONSULTANT: Physical therapy.   LABORATORY, DIAGNOSTIC AND RADIOLOGICAL DATA:  Left lower extremity Doppler on 07/15 showed no evidence of deep vein thrombosis.   Urinalysis on admission showed 27 WBCs, 1+ bacteria, 2+ leukocyte esterase, positive nitrite.   Blood cultures x2 were negative.   HISTORY AND SHORT HOSPITAL COURSE: Patient is a 74 year old female with above-mentioned medical problems was admitted for left lower extremity cellulitis and difficulty in ambulation. Please see Dr. Trena Platt dictated history and physical for further details. Physical therapy consultation was obtained who recommended short-term rehab which was evaluated by care management and she has found a bed at [facility]. Her cellulitis has been improving on current antibiotic and was switched to oral Bactrim and she was also found to a urinary tract infection and it has kept improving. She is being discharged to [facility] in stable condition.   PHYSICAL EXAMINATION: VITAL SIGNS: On the date of discharge her vital signs are as follows: Temperature 98.3, heart rate 88 per minute, respirations 18 per minute, blood pressure 122/70 mmHg. She is saturating 96% on room air. Pertinent Physical Examination: CARDIOVASCULAR: S1, S2 normal. No murmur, rales gallop. LUNGS: Clear to auscultation bilaterally. No wheezing, rales, rhonchi, crepitation. ABDOMEN: Soft, obese, benign. EXTREMITIES: Left lower extremity has minimal erythema and rash, not much warm anymore. She has chronic  venostasis changes in both lower extremities. All other physical examination remained at the baseline.   DISCHARGE MEDICATIONS:  1. Levothyroxine 100 mcg p.o. daily. 2. Lipitor 20 mg p.o. daily.  3. Potassium chloride 20 mEq p.o. daily. 4. Amlodipine 5 mg p.o. daily.  5. Cranberry once daily.  6. Aspirin 81 mg p.o. daily.  7. Lasix 20 mg p.o. daily.  8. Colace 100 mg p.o. daily.  9. Chondroitin/glucosamine 1 tablet p.o. daily.  10. Ventolin 2 puffs inhaled as needed.  11. Multivitamin once daily. 12. Calcium with vitamin D 2 tablets p.o. daily.  13. Bactrim double strength 1 tablet p.o. b.i.d. for five days.   DISCHARGE DIET: Low sodium.   DISCHARGE ACTIVITY: As tolerated.   DISCHARGE INSTRUCTIONS AND FOLLOWUP: Patient was instructed to follow up with her primary care physician, Dr. Einar Pheasant, in 1 to 2 weeks. She will get physical therapy evaluation and management while at the facility.   TOTAL TIME DISCHARGING THIS PATIENT: 55 minutes.   ____________________________ Lucina Mellow. Manuella Ghazi, MD vss:cms D: 09/20/2011 11:33:58 ET T: 09/20/2011 12:05:11 ET JOB#: [id number] cc: Rudy Luhmann S. Manuella Ghazi, MD, <Dictator> Einar Pheasant, MD [place] MD ELECTRONICALLY SIGNED 09/20/2011 16:50
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
## 2014-06-25 LAB — BASIC METABOLIC PANEL
Anion Gap: 7 (ref 7–16)
BUN: 19 mg/dL
Calcium, Total: 9.4 mg/dL
Chloride: 100 mmol/L — ABNORMAL LOW
Co2: 30 mmol/L
Creatinine: 1.14 mg/dL — ABNORMAL HIGH
EGFR (African American): 55 — ABNORMAL LOW
EGFR (Non-African Amer.): 48 — ABNORMAL LOW
Glucose: 134 mg/dL — ABNORMAL HIGH
Potassium: 4.2 mmol/L
Sodium: 137 mmol/L

## 2014-06-25 LAB — CBC WITH DIFFERENTIAL/PLATELET
Basophil #: 0.1 10*3/uL (ref 0.0–0.1)
Basophil %: 0.4 %
Eosinophil #: 0.1 10*3/uL (ref 0.0–0.7)
Eosinophil %: 0.5 %
HCT: 38.9 % (ref 35.0–47.0)
HGB: 13 g/dL (ref 12.0–16.0)
Lymphocyte #: 1.1 10*3/uL (ref 1.0–3.6)
Lymphocyte %: 7.3 %
MCH: 31 pg (ref 26.0–34.0)
MCHC: 33.4 g/dL (ref 32.0–36.0)
MCV: 93 fL (ref 80–100)
Monocyte #: 0.9 x10 3/mm (ref 0.2–0.9)
Monocyte %: 5.7 %
Neutrophil #: 13.3 10*3/uL — ABNORMAL HIGH (ref 1.4–6.5)
Neutrophil %: 86.1 %
Platelet: 261 10*3/uL (ref 150–440)
RBC: 4.18 10*6/uL (ref 3.80–5.20)
RDW: 13.2 % (ref 11.5–14.5)
WBC: 15.4 10*3/uL — ABNORMAL HIGH (ref 3.6–11.0)

## 2014-06-26 ENCOUNTER — Inpatient Hospital Stay: Admit: 2014-06-26 | Disposition: A | Discharge status: Home / Self Care | Payer: Self-pay | Admitting: Internal Medicine

## 2014-06-26 LAB — CBC WITH DIFFERENTIAL/PLATELET
Basophil #: 0.1 10*3/uL (ref 0.0–0.1)
Basophil %: 0.4 %
Eosinophil #: 0.2 10*3/uL (ref 0.0–0.7)
Eosinophil %: 1.6 %
HCT: 34.1 % — ABNORMAL LOW (ref 35.0–47.0)
HGB: 11.3 g/dL — ABNORMAL LOW (ref 12.0–16.0)
Lymphocyte #: 1.2 10*3/uL (ref 1.0–3.6)
Lymphocyte %: 9.7 %
MCH: 30.6 pg (ref 26.0–34.0)
MCHC: 33.2 g/dL (ref 32.0–36.0)
MCV: 92 fL (ref 80–100)
Monocyte #: 0.9 x10 3/mm (ref 0.2–0.9)
Monocyte %: 7.1 %
Neutrophil #: 10 10*3/uL — ABNORMAL HIGH (ref 1.4–6.5)
Neutrophil %: 81.2 %
Platelet: 228 10*3/uL (ref 150–440)
RBC: 3.69 10*6/uL — ABNORMAL LOW (ref 3.80–5.20)
RDW: 13.3 % (ref 11.5–14.5)
WBC: 12.4 10*3/uL — ABNORMAL HIGH (ref 3.6–11.0)

## 2014-06-27 LAB — CBC WITH DIFFERENTIAL/PLATELET
Basophil #: 0.1 10*3/uL (ref 0.0–0.1)
Basophil %: 0.8 %
Eosinophil #: 0.3 10*3/uL (ref 0.0–0.7)
Eosinophil %: 2.4 %
HCT: 33.7 % — ABNORMAL LOW (ref 35.0–47.0)
HGB: 11.1 g/dL — ABNORMAL LOW (ref 12.0–16.0)
Lymphocyte #: 1.7 10*3/uL (ref 1.0–3.6)
Lymphocyte %: 13 %
MCH: 30.5 pg (ref 26.0–34.0)
MCHC: 32.9 g/dL (ref 32.0–36.0)
MCV: 93 fL (ref 80–100)
Monocyte #: 1.3 x10 3/mm — ABNORMAL HIGH (ref 0.2–0.9)
Monocyte %: 9.5 %
Neutrophil #: 9.9 10*3/uL — ABNORMAL HIGH (ref 1.4–6.5)
Neutrophil %: 74.3 %
Platelet: 238 10*3/uL (ref 150–440)
RBC: 3.64 10*6/uL — ABNORMAL LOW (ref 3.80–5.20)
RDW: 13.1 % (ref 11.5–14.5)
WBC: 13.3 10*3/uL — ABNORMAL HIGH (ref 3.6–11.0)

## 2014-06-30 LAB — CULTURE, BLOOD (SINGLE)

## 2014-07-05 NOTE — Discharge Summary (Signed)
PATIENT NAME:  [name], MCLEISH MR#:  [id number] DATE OF BIRTH:  Jan 24, 1941  DATE OF ADMISSION:  06/26/2014 DATE OF DISCHARGE:  06/27/2014  PRIMARY CARE PHYSICIAN: Nonlocal   DISCHARGE DIAGNOSES: Left lower extremity cellulitis, difficult ambulation, morbid obesity with chronic bilateral lymphedema.   CONDITION: Stable.   CODE STATUS: Full code.   HOME MEDICATIONS: Please refer to the medication reconciliation list.   SERVICES: The patient needs physical therapy, home health with nurse and nurse aide.   DIET: Low-sodium, low-fat, low-cholesterol diet.   ACTIVITY: As tolerated.   FOLLOWUP CARE: With PCP within 1-2 weeks.   REASON FOR ADMISSION: Left lower extremity redness for 1 week.   HISTORY OF PRESENT ILLNESS: A 74 year old Caucasian female with a history of hypertension, chronic lymphedema came to ED due to left lower extremity redness for 1 week. For detailed history and physical examination, please refer to the admission note dictated by Dr. Fritzi Mandes. The patient's WBC was 15. After admission, the patient has been treated with IV vancomycin. Blood culture is negative. The patient's left lower extremity erythema and tenderness is better. White count decreased to 13. The patient wants to go home today. The patient also got a physical therapy evaluation. The patient is clinically stable and will be discharged to home with home health and PT today. I discussed the patient's discharge plan with the patient, nurse, and case manager.   TIME SPENT: About 36 minutes.    ____________________________ Demetrios Loll, MD qc:bm D: 06/27/2014 15:12:00 ET T: 06/28/2014 01:31:28 ET JOB#: [id number]  cc: Demetrios Loll, MD, <Dictator> Demetrios Loll MD ELECTRONICALLY SIGNED 06/28/2014 14:26

## 2014-07-05 NOTE — H&P (Signed)
PATIENT NAME:  [name], [name] MR#:  [id number] DATE OF BIRTH:  [birth year]  DATE OF ADMISSION:  06/25/2014  PRIMARY CARE PHYSICIAN:  [employer].     CHIEF COMPLAINT:  Left lower extremity redness for 1 week.   HISTORY OF PRESENT ILLNESS:  [name] is a 74 year old Caucasian female with past medical history of  hypertension, chronic lymphedema, hypothyroidism, and hyperlipidemia, who comes to the Emergency Room with increasing redness in her left lower extremity.  She had a low-grade fever of 99.1.  Per patient, the redness has been increasing since 1 week and upon asking her, she reports she tried to do home remedies to keep the redness down,  however  it continued to increase.  She came to the Emergency Room and was started on IV vancomycin.  She is being admitted for further evaluation and management.   PAST MEDICAL HISTORY:  1.  Chronic bilateral lower extremity lymphedema, more on the left than the right.  2.  Recurrent cellulitis.  3.  Hypertension.  4.  Hypothyroidism.  5.  Hyperlipidemia.  6.  Morbid obesity.  7.  Asthma.   ALLERGIES: VICODIN.   MEDICATIONS:  1.  Potassium chloride 20 mEq p.o. daily.  2.  Osteo Bi-Flex 1 p.o. daily.  3.  Levothyroxine 125 mcg p.o. daily.  4.  Lasix 20 mg p.o. daily.  5.  Calcium with vitamin D, 2 tablets p.o. daily.  6.  Atorvastatin 20 mg p.o. daily.  7.  Aspirin 81 mg daily.  8.  Amlodipine 5 mg p.o. daily.   REVIEW OF SYSTEMS:   CONSTITUTIONAL:  Positive for fever, fatigue and weakness.  EYES:  No blurred or double vision, glaucoma, or cataracts.  ENT:  No tinnitus, ear pain, hearing loss.  RESPIRATORY:  No cough, wheeze, or hemoptysis.  CARDIOVASCULAR:  No chest pain, dyspnea on exertion, syncope or palpitations.  GASTROINTESTINAL:  No nausea, vomiting, diarrhea or abdominal pain.  GENITOURINARY:  No dysuria, hematuria, frequency.  ENDOCRINE:  No polyuria, nocturia or thyroid problems.  HEMATOLOGY:  No anemia or  easy bruising.  SKIN:  The patient has chronic lymphedema and chronic venous stasis changes in both lower extremities.  PSYCHIATRIC:  No anxiety, depression, or bipolar disorder.  NEUROLOGIC:  No CVA, TIA, or dysarthria.  All other systems reviewed are negative.   SOCIAL HISTORY:  She is married, lives at home with her husband.  She feels her husband is not helping her out as much at home.  She is a nonsmoker, nonalcoholic.  No other drug use.   FAMILY HISTORY:  Father died at age of 53.  Mother died of Alzheimer's and leukemia.  One brother had brain tumor.     PHYSICAL EXAMINATION:  GENERAL:  The patient is awake, alert, oriented x 3, not in acute distress. VITAL SIGNS:  Temperature is 99.4, pulse is 94, blood pressure 127/81.  Saturations are 98% on room air.  HEENT:  Atraumatic, normocephalic.  PERLA, EOMI.   Oral mucosa is moist.  NECK:  Supple.  No JVD.  No carotid bruits.  RESPIRATORY:  Clear to auscultation bilaterally.  No rales, rhonchi, respiratory distress, or labored breathing.  HEART:  Both the heart sounds are normal.  Rate and rhythm are regular.  PMI not lateralized. Chest nontender.  EXTREMITIES:  The patient has bilateral lower extremity chronic lymphedema.  She has cellulitis midway her left tibial shin.  No draining lesions or any ulcers noted.  Unable to get pedal pulses or  femoral pulses due to body habitus and significant chronic lymphedema.  She has chronic venous stasis in both lower extremities.  ABDOMEN:  Obese, soft, nontender.  No organomegaly.  Positive bowel sounds.  NEUROLOGIC:  Grossly intact cranial nerves II through XII.  No motor or sensory deficits. PSYCHIATRIC:  The patient is awake, alert, oriented x 3.   EXTREMITIES:  Left lower extremity Doppler revealed no DVT.    LABORATORY DATA:  White count 15,000.  Glucose 134, BUN 19, creatinine 1.14, sodium 137, potassium is 4.2, chloride 100.   ASSESSMENT AND PLAN: 74 year old [place] with history of  hypertension, hypothyroidism and chronic lymphedema comes in with:  1.  Left lower extremity cellulitis, recurrent.  The patient is started on IV vancomycin.  Blood cultures have been obtained. Followup results.  Elevated white count likely due to is no cellulitis. De-escalate antibiotics.  Change it to possible p.o. clindamycin or Bactrim at discharge.  2.  Hypothyroidism.  Continue Synthroid.  3.  Hypertension.  Blood pressure is stable.  Continue home medications.  4.  Difficult ambulation.  5.  Morbid obesity with chronic bilateral lymphedema.  The patient has a walker and she is able to get around according to the patient.   Further workup according to patient's clinical course.  Hospital admission plan was discussed with the patient.   TIME SPENT: 50 minutes.  Case was discussed with the patient's son also.  Social worker and care management consultation for some additional help for patient at home.    ____________________________ Gus Height A. Posey Pronto, MD sap:[id number] D: 06/25/2014 17:40:23 ET T: 06/25/2014 17:55:00 ET JOB#: [id number]  cc: Amir Fick A. Posey Pronto, MD, <Dictator> Ilda Basset MD ELECTRONICALLY SIGNED 07/03/2014 15:15

## 2014-08-13 ENCOUNTER — Ambulatory Visit: Payer: Self-pay | Admitting: Obstetrics and Gynecology

## 2014-08-13 ENCOUNTER — Telehealth: Payer: Self-pay | Admitting: Obstetrics and Gynecology

## 2014-08-13 NOTE — Telephone Encounter (Signed)
DR CHERRY THIS PT MISSED HER 3 MO F/U BECAUSE OF TRANSPORTATION ERROR. IT WAS NOT HER FAULT. I CALLED HER TO RESCHEDULE AND SHE GOT UPSET B/C I COULDN'T PUT HER ON TOMORROW AFTERNOON. SHE INSISTED ON YOU CALLING HER. SHE SAID A LOT HAS HAPPENED IN 3 MOS AND THEN SHE HUNG UP ON ME. SORRY!!!

## 2014-08-14 NOTE — Telephone Encounter (Signed)
Contacted patient who notes that she was concerned as she is having some side effects from her medication (Estring) and was not able to come to her appointment yesterday due to transportation issues.  Discussed concerns with patient and given reassurance. Patient unable to remove Estring due to limited mobility and body habitus.  Will have patient rescheduled for next available.   Rubie Maid, MD [facility].  08/14/2014  9:14 AM

## 2014-08-19 ENCOUNTER — Ambulatory Visit (INDEPENDENT_AMBULATORY_CARE_PROVIDER_SITE_OTHER): Payer: PPO | Admitting: Obstetrics and Gynecology

## 2014-08-19 ENCOUNTER — Encounter: Payer: Self-pay | Admitting: Obstetrics and Gynecology

## 2014-08-19 VITALS — BP 137/72 | HR 106 | Ht 66.0 in | Wt >= 6400 oz

## 2014-08-19 DIAGNOSIS — N952 Postmenopausal atrophic vaginitis: Secondary | ICD-10-CM | POA: Diagnosis not present

## 2014-08-19 DIAGNOSIS — Z6841 Body Mass Index (BMI) 40.0 and over, adult: Secondary | ICD-10-CM | POA: Diagnosis not present

## 2014-08-19 NOTE — Progress Notes (Signed)
GYNECOLOGY PROGRESS NOTE   Subjective:    Patient ID: [name], female    DOB: 01/09/1941, 74 y.o.   MRN: [id number]  HPI Patient is a 74 y.o. P3003 morbidly obese postmenopausal female who presents for 3 month f/u of PMB secondary to vaginal atrophy. Had Estring placed at last visit. Patient notes that the symptoms of bleeding and pelvic pain that she was experiencing prior to Estring had resolved, however she began experiencing side effects from the medication, including severe hot flushes lasting x several hours, 1 episode each day.     I have reviewed the patient's medical, surgical, family, social, medication, and allergy history in detail and updated as necessary in the computerized patient record.   Review of Systems Review of Systems - Negative except what is noted in HPI, and hemorrhoids      Objective:   Physical Exam Blood pressure 137/72, pulse 106, height 5\' 6"  (1.676 m), weight 401 lb 11.2 oz (182.21 kg), last menstrual period 04/16/1993.  General appearance: alert, cooperative, appears stated age and no distress, morbid obesity Abdomen: soft, non-tender; bowel sounds normal; no masses,  no organomegaly and large pannus Pelvic: deferred and vaginal ring removed. Extremities: edema +1 pitting. Neurologic: Grossly normal     Assessment & Plan:  Vaginal atrophy - patient reports good effects and bad effects noted from Estring.  Desired to have Estring removed today after 3 months of use.  Notes that she would like to see how she does off of any medications for now as her initial complaints have now resolved, and did not like the side effects of the medication. Would not recommend supplementation with oral HRT due to patient initially without systemic vasomotor symptoms, as well as morbid obesity and would likely require a high dose for management of symptoms.  Cannot use localized estrogen vaginal creams or tablets, or vaginal lubricants due to limited mobility.   Patient to f/u as needed.  If side effects do not abate after removal of vaginal ring, or if prior symptoms return, will need to f/u for additional discussion of potential management options. Discussed alternative methods (foods) that may be able to increase estrogen levels slightly to treat symptoms.   RTC prn.   Rubie Maid, MD [facility]

## 2015-03-23 ENCOUNTER — Emergency Department: Payer: PPO

## 2015-03-23 ENCOUNTER — Inpatient Hospital Stay
Admission: EM | Admit: 2015-03-23 | Discharge: 2015-03-25 | DRG: 603 | Disposition: A | Discharge status: Skilled Nursing Facility | Payer: PPO | Attending: Internal Medicine | Admitting: Internal Medicine

## 2015-03-23 ENCOUNTER — Encounter: Payer: Self-pay | Admitting: *Deleted

## 2015-03-23 DIAGNOSIS — Z808 Family history of malignant neoplasm of other organs or systems: Secondary | ICD-10-CM

## 2015-03-23 DIAGNOSIS — E785 Hyperlipidemia, unspecified: Secondary | ICD-10-CM | POA: Diagnosis present

## 2015-03-23 DIAGNOSIS — Z806 Family history of leukemia: Secondary | ICD-10-CM | POA: Diagnosis not present

## 2015-03-23 DIAGNOSIS — L03119 Cellulitis of unspecified part of limb: Secondary | ICD-10-CM

## 2015-03-23 DIAGNOSIS — L03115 Cellulitis of right lower limb: Principal | ICD-10-CM | POA: Diagnosis present

## 2015-03-23 DIAGNOSIS — M1990 Unspecified osteoarthritis, unspecified site: Secondary | ICD-10-CM | POA: Diagnosis present

## 2015-03-23 DIAGNOSIS — J449 Chronic obstructive pulmonary disease, unspecified: Secondary | ICD-10-CM | POA: Diagnosis present

## 2015-03-23 DIAGNOSIS — G4730 Sleep apnea, unspecified: Secondary | ICD-10-CM | POA: Diagnosis present

## 2015-03-23 DIAGNOSIS — Z820 Family history of epilepsy and other diseases of the nervous system: Secondary | ICD-10-CM

## 2015-03-23 DIAGNOSIS — L0390 Cellulitis, unspecified: Secondary | ICD-10-CM | POA: Diagnosis present

## 2015-03-23 DIAGNOSIS — J45909 Unspecified asthma, uncomplicated: Secondary | ICD-10-CM | POA: Diagnosis present

## 2015-03-23 DIAGNOSIS — Z90710 Acquired absence of both cervix and uterus: Secondary | ICD-10-CM

## 2015-03-23 DIAGNOSIS — E039 Hypothyroidism, unspecified: Secondary | ICD-10-CM | POA: Diagnosis present

## 2015-03-23 DIAGNOSIS — I890 Lymphedema, not elsewhere classified: Secondary | ICD-10-CM | POA: Diagnosis present

## 2015-03-23 DIAGNOSIS — K219 Gastro-esophageal reflux disease without esophagitis: Secondary | ICD-10-CM | POA: Diagnosis present

## 2015-03-23 DIAGNOSIS — Z7982 Long term (current) use of aspirin: Secondary | ICD-10-CM | POA: Diagnosis not present

## 2015-03-23 DIAGNOSIS — Z6841 Body Mass Index (BMI) 40.0 and over, adult: Secondary | ICD-10-CM | POA: Diagnosis not present

## 2015-03-23 DIAGNOSIS — L89300 Pressure ulcer of unspecified buttock, unstageable: Secondary | ICD-10-CM | POA: Diagnosis present

## 2015-03-23 DIAGNOSIS — Z87891 Personal history of nicotine dependence: Secondary | ICD-10-CM | POA: Diagnosis not present

## 2015-03-23 DIAGNOSIS — Z886 Allergy status to analgesic agent status: Secondary | ICD-10-CM

## 2015-03-23 DIAGNOSIS — I10 Essential (primary) hypertension: Secondary | ICD-10-CM | POA: Diagnosis present

## 2015-03-23 DIAGNOSIS — L03116 Cellulitis of left lower limb: Secondary | ICD-10-CM | POA: Diagnosis present

## 2015-03-23 DIAGNOSIS — L8990 Pressure ulcer of unspecified site, unspecified stage: Secondary | ICD-10-CM | POA: Diagnosis present

## 2015-03-23 LAB — CBC WITH DIFFERENTIAL/PLATELET
Basophils Absolute: 0.2 10*3/uL — ABNORMAL HIGH (ref 0–0.1)
Basophils Relative: 1 %
Eosinophils Absolute: 0.4 10*3/uL (ref 0–0.7)
Eosinophils Relative: 4 %
HCT: 41.3 % (ref 35.0–47.0)
Hemoglobin: 13.6 g/dL (ref 12.0–16.0)
Lymphocytes Relative: 13 %
Lymphs Abs: 1.4 10*3/uL (ref 1.0–3.6)
MCH: 30.8 pg (ref 26.0–34.0)
MCHC: 32.9 g/dL (ref 32.0–36.0)
MCV: 93.4 fL (ref 80.0–100.0)
Monocytes Absolute: 0.7 10*3/uL (ref 0.2–0.9)
Monocytes Relative: 7 %
Neutro Abs: 8.5 10*3/uL — ABNORMAL HIGH (ref 1.4–6.5)
Neutrophils Relative %: 75 %
Platelets: 260 10*3/uL (ref 150–440)
RBC: 4.42 MIL/uL (ref 3.80–5.20)
RDW: 13.5 % (ref 11.5–14.5)
WBC: 11.2 10*3/uL — ABNORMAL HIGH (ref 3.6–11.0)

## 2015-03-23 LAB — COMPREHENSIVE METABOLIC PANEL
ALT: 25 U/L (ref 14–54)
AST: 24 U/L (ref 15–41)
Albumin: 4.3 g/dL (ref 3.5–5.0)
Alkaline Phosphatase: 60 U/L (ref 38–126)
Anion gap: 8 (ref 5–15)
BUN: 20 mg/dL (ref 6–20)
CO2: 28 mmol/L (ref 22–32)
Calcium: 9.3 mg/dL (ref 8.9–10.3)
Chloride: 104 mmol/L (ref 101–111)
Creatinine, Ser: 1.19 mg/dL — ABNORMAL HIGH (ref 0.44–1.00)
GFR calc Af Amer: 51 mL/min — ABNORMAL LOW (ref >60)
GFR calc non Af Amer: 44 mL/min — ABNORMAL LOW (ref >60)
Glucose, Bld: 119 mg/dL — ABNORMAL HIGH (ref 65–99)
Potassium: 4.3 mmol/L (ref 3.5–5.1)
Sodium: 140 mmol/L (ref 135–145)
Total Bilirubin: 0.5 mg/dL (ref 0.3–1.2)
Total Protein: 7.6 g/dL (ref 6.5–8.1)

## 2015-03-23 LAB — MAGNESIUM: Magnesium: 1.9 mg/dL (ref 1.7–2.4)

## 2015-03-23 LAB — BRAIN NATRIURETIC PEPTIDE: B Natriuretic Peptide: 74 pg/mL (ref 0.0–100.0)

## 2015-03-23 MED ORDER — ACETAMINOPHEN 650 MG RE SUPP: 650.0000 mg | Freq: Four times a day (QID) | RECTAL | Status: DC | PRN

## 2015-03-23 MED ORDER — HYDROXYZINE HCL 25 MG PO TABS: 25.0000 mg | ORAL_TABLET | Freq: Three times a day (TID) | ORAL | Status: DC | PRN

## 2015-03-23 MED ORDER — VANCOMYCIN HCL 500 MG IV SOLR: 500.0000 mg | Freq: Once | INTRAVENOUS | Status: DC

## 2015-03-23 MED ORDER — VANCOMYCIN HCL IN DEXTROSE 1-5 GM/200ML-% IV SOLN
1000.0000 mg | Freq: Once | INTRAVENOUS | Status: AC
  Administered 2015-03-23: 1000 mg via INTRAVENOUS
  Filled 2015-03-23: qty 200

## 2015-03-23 MED ORDER — LEVOTHYROXINE SODIUM 125 MCG PO TABS
125.0000 ug | ORAL_TABLET | Freq: Every day | ORAL | Status: DC
  Administered 2015-03-24 – 2015-03-25 (×2): 125 ug via ORAL
  Filled 2015-03-23 (×2): qty 1

## 2015-03-23 MED ORDER — FUROSEMIDE 10 MG/ML IJ SOLN
40.0000 mg | Freq: Once | INTRAMUSCULAR | Status: AC
  Administered 2015-03-23: 40 mg via INTRAVENOUS
  Filled 2015-03-23: qty 4

## 2015-03-23 MED ORDER — HEPARIN SODIUM (PORCINE) 5000 UNIT/ML IJ SOLN
5000.0000 [IU] | Freq: Three times a day (TID) | INTRAMUSCULAR | Status: DC
  Administered 2015-03-23 – 2015-03-24 (×2): 5000 [IU] via SUBCUTANEOUS
  Filled 2015-03-23 (×2): qty 1

## 2015-03-23 MED ORDER — ACETAMINOPHEN 325 MG PO TABS
650.0000 mg | ORAL_TABLET | Freq: Four times a day (QID) | ORAL | Status: DC | PRN
  Administered 2015-03-23: 650 mg via ORAL
  Filled 2015-03-23: qty 2

## 2015-03-23 MED ORDER — AMLODIPINE BESYLATE 5 MG PO TABS
5.0000 mg | ORAL_TABLET | Freq: Every day | ORAL | Status: DC
  Administered 2015-03-24: 5 mg via ORAL
  Filled 2015-03-23 (×2): qty 1

## 2015-03-23 MED ORDER — ASPIRIN 81 MG PO CHEW
81.0000 mg | CHEWABLE_TABLET | Freq: Every day | ORAL | Status: DC
  Administered 2015-03-24 – 2015-03-25 (×2): 81 mg via ORAL
  Filled 2015-03-23 (×2): qty 1

## 2015-03-23 MED ORDER — GLUCOSAMINE-CHONDROITIN 500-400 MG PO TABS: 1.0000 | ORAL_TABLET | Freq: Every day | ORAL | Status: DC

## 2015-03-23 MED ORDER — ONDANSETRON HCL 4 MG PO TABS: 4.0000 mg | ORAL_TABLET | Freq: Four times a day (QID) | ORAL | Status: DC | PRN

## 2015-03-23 MED ORDER — NYSTATIN 100000 UNIT/GM EX CREA
1.0000 "application " | TOPICAL_CREAM | Freq: Two times a day (BID) | CUTANEOUS | Status: DC | PRN
  Filled 2015-03-23: qty 15

## 2015-03-23 MED ORDER — OXYCODONE HCL 5 MG PO TABS
5.0000 mg | ORAL_TABLET | ORAL | Status: DC | PRN
  Administered 2015-03-24: 5 mg via ORAL
  Filled 2015-03-23: qty 1

## 2015-03-23 MED ORDER — ONDANSETRON HCL 4 MG/2ML IJ SOLN: 4.0000 mg | Freq: Four times a day (QID) | INTRAMUSCULAR | Status: DC | PRN

## 2015-03-23 MED ORDER — MORPHINE SULFATE (PF) 2 MG/ML IV SOLN: 2.0000 mg | INTRAVENOUS | Status: DC | PRN

## 2015-03-23 MED ORDER — VITAMIN D 1000 UNITS PO TABS
2000.0000 [IU] | ORAL_TABLET | Freq: Every day | ORAL | Status: DC
  Administered 2015-03-24 – 2015-03-25 (×2): 2000 [IU] via ORAL
  Filled 2015-03-23 (×2): qty 2

## 2015-03-23 MED ORDER — CALCIUM CARBONATE-VITAMIN D 500-200 MG-UNIT PO TABS
1.0000 | ORAL_TABLET | Freq: Two times a day (BID) | ORAL | Status: DC
  Administered 2015-03-23 – 2015-03-25 (×4): 1 via ORAL
  Filled 2015-03-23 (×4): qty 1

## 2015-03-23 MED ORDER — POLYVINYL ALCOHOL 1.4 % OP SOLN
1.0000 [drp] | Freq: Two times a day (BID) | OPHTHALMIC | Status: DC | PRN
  Filled 2015-03-23: qty 15

## 2015-03-23 MED ORDER — VANCOMYCIN HCL 10 G IV SOLR
1250.0000 mg | Freq: Two times a day (BID) | INTRAVENOUS | Status: DC
  Administered 2015-03-24 – 2015-03-25 (×3): 1250 mg via INTRAVENOUS
  Filled 2015-03-23 (×5): qty 1250

## 2015-03-23 MED ORDER — ATORVASTATIN CALCIUM 20 MG PO TABS
20.0000 mg | ORAL_TABLET | Freq: Every day | ORAL | Status: DC
  Administered 2015-03-24: 20 mg via ORAL
  Filled 2015-03-23: qty 1

## 2015-03-23 NOTE — Progress Notes (Signed)
PHARMACIST - PHYSICIAN ORDER COMMUNICATION  CONCERNING: P&T Medication Policy on Herbal Medications  DESCRIPTION:  This patient's order for:  Glucosamine-chondroitin  has been noted.  This product(s) is classified as an "herbal" or natural product. Due to a lack of definitive safety studies or FDA approval, nonstandard manufacturing practices, plus the potential risk of unknown drug-drug interactions while on inpatient medications, the Pharmacy and Therapeutics Committee does not permit the use of "herbal" or natural products of this type within [place].   ACTION TAKEN: The pharmacy department is unable to verify this order at this time  Please reevaluate patient's clinical condition at discharge and address if the herbal or natural product(s) should be resumed at that time.    

## 2015-03-23 NOTE — ED Provider Notes (Signed)
Time Seen: Approximately [id number]  I have reviewed the triage notes  Chief Complaint: Leg Swelling   History of Present Illness: [name] is a 75 y.o. female who presents with bilateral leg swelling along with redness. She states symptoms of been progressive with the swelling in her legs going on for years. She states she came the emergency department because she developed some increase in her essential tremors. She also was stating that the legs seem to be itching and burning and she thought that the skin was getting infected. She states she has had previous cellulitis in her lower extremities and medical records show a hospitalization here back in April 2016. She denies any fever at home. She denies any focal weakness. She denies any shortness though occasional cough with clear sputum. She denies any headache or new focal weakness.   Past Medical History  Diagnosis Date  . Hypothyroidism   . Hypertension   . Hyperlipidemia   . Sleep apnea   . Morbid obesity ([place])   . Osteoarthritis   . Asthma   . Osteoarthritis   . Vaginal Pap smear, abnormal   . Vaginal atrophy   . PMB (postmenopausal bleeding)   . Acute dermatitis   . COPD (chronic obstructive pulmonary disease) ([place])   . Depression   . GERD (gastroesophageal reflux disease)   . Incontinence     Patient Active Problem List   Diagnosis Date Noted  . Cellulitis 03/23/2015  . Vaginal atrophy 08/19/2014  . Asthma, chronic 04/18/2012  . Obstructive sleep apnea 04/18/2012  . Essential hypertension, benign 04/18/2012  . Hypercholesterolemia 04/18/2012  . Aortic stenosis 04/18/2012  . Endometrial cancer ([place]) 04/18/2012  . Diverticulosis of colon without hemorrhage 04/18/2012  . Unspecified hypothyroidism 04/18/2012  . Overactive bladder 04/18/2012    Past Surgical History  Procedure Laterality Date  . Cholecystectomy    . Umbilical hernia repair    . Abdominal hysterectomy  2014    [facility]  . Dilation and  curettage of uterus      Past Surgical History  Procedure Laterality Date  . Cholecystectomy    . Umbilical hernia repair    . Abdominal hysterectomy  2014    [facility]  . Dilation and curettage of uterus      No current outpatient prescriptions on file.  Allergies:  Vicodin  Family History: Family History  Problem Relation Age of Onset  . Alzheimer's disease Mother   . Leukemia Mother   . Brain cancer Brother   . Heart disease Neg Hx   . Diabetes Neg Hx   . Breast cancer Neg Hx   . Ovarian cancer Neg Hx     Social History: Social History  Substance Use Topics  . Smoking status: Former Research scientist (life sciences)  . Smokeless tobacco: Never Used  . Alcohol Use: No     Review of Systems:   10 point review of systems was performed and was otherwise negative:  Constitutional: No fever Eyes: No visual disturbances ENT: No sore throat, ear pain Cardiac: No chest pain Respiratory: No shortness of breath, wheezing, or stridor Abdomen: No abdominal pain, no vomiting, No diarrhea Endocrine: No weight loss, No night sweats Extremities: No peripheral edema, cyanosis Skin: No rashes, easy bruising Neurologic: No focal weakness, trouble with speech or swollowing Urologic: No dysuria, Hematuria, or urinary frequency   Physical Exam:  ED Triage Vitals  Enc Vitals Group     BP 03/23/15 1730 136/64 mmHg     Pulse Rate 03/23/15  1659 86     Resp 03/23/15 1659 16     Temp 03/23/15 1659 98.1 F (36.7 C)     Temp Source 03/23/15 1659 Oral     SpO2 03/23/15 1659 98 %     Weight 03/23/15 1659 414 lb (187.789 kg)     Height 03/23/15 1659 5\' 6"  (1.676 m)     Head Cir --      Peak Flow --      Pain Score --      Pain Loc --      Pain Edu? --      Excl. in [place]? --     General: Awake , Alert , and Oriented times 3; GCS 15 Head: Normal cephalic , atraumatic Eyes: Pupils equal , round, reactive to light Nose/Throat: No nasal drainage, patent upper airway without erythema or exudate.  Neck:  Supple, Full range of motion, No anterior adenopathy or palpable thyroid masses Lungs: Clear to ascultation without wheezes , rhonchi, or rales Heart: Regular rate, regular rhythm with a grade 3/6 left-sided ejection murmur Abdomen: Morbidly obese Soft, non tender without rebound, guarding , or rigidity; bowel sounds positive and symmetric in all 4 quadrants. No organomegaly .        Extremities: Large peripheral edema circumferential in nature. Family noticed slightly raised diffuse rash which also appears to be circumferential. Rash is nonblanching in nature Neurologic: normal ambulation, Motor symmetric without deficits, sensory intact Skin: warm, dry, no rashes   Labs:   All laboratory work was reviewed including any pertinent negatives or positives listed below:  Labs Reviewed  CBC WITH DIFFERENTIAL/PLATELET - Abnormal; Notable for the following:    WBC 11.2 (*)    Neutro Abs 8.5 (*)    Basophils Absolute 0.2 (*)    All other components within normal limits  COMPREHENSIVE METABOLIC PANEL - Abnormal; Notable for the following:    Glucose, Bld 119 (*)    Creatinine, Ser 1.19 (*)    GFR calc non Af Amer 44 (*)    GFR calc Af Amer 51 (*)    All other components within normal limits  CULTURE, BLOOD (ROUTINE X 2)  CULTURE, BLOOD (ROUTINE X 2)  BRAIN NATRIURETIC PEPTIDE  MAGNESIUM  BASIC METABOLIC PANEL  CBC   review laboratory work shows slightly elevated white blood cell count, no other significant findings. Blood culture 2 is pending   Radiology:   EXAM: CHEST 2 VIEW  COMPARISON: None.  FINDINGS: The lungs are well-aerated. A small left pleural effusion is suspected, with mild left basilar atelectasis. There is no evidence of pneumothorax.  The heart is borderline enlarged. No acute osseous abnormalities are seen.  IMPRESSION: Suspect small left pleural effusion, with mild left basilar atelectasis. Borderline cardiomegaly.     I personally reviewed the  radiologic studies    ED Course:  The patient's stay here showed some gradual symptomatic improvement. She was given Lasix for peripheral edema with a long history of lymphedema. She does appear to have associated cellulitis with her lower extremities. Patient was started on vancomycin after blood cultures 2 were obtained.    Assessment:  Cellulitis both lower extremities Lymphedema   Final Clinical Impression:   Final diagnoses:  Cellulitis of lower extremity, unspecified laterality     Plan: * Inpatient management I reviewed the case with the hospitalist team, further disposition and management depends upon her evaluation            Daymon Larsen, MD 03/23/15  [id number] 

## 2015-03-23 NOTE — Progress Notes (Addendum)
ANTIBIOTIC CONSULT NOTE - INITIAL  Pharmacy Consult for vancomycin Indication: cellulitis  Allergies  Allergen Reactions  . Vicodin [Hydrocodone-Acetaminophen] Other (See Comments)    Reaction:  Raises pts BP     Patient Measurements: Height: 5\' 6"  (167.6 cm) Weight: (!) 414 lb (187.789 kg) IBW/kg (Calculated) : 59.3 Adjusted Body Weight: 110.4 kg  Vital Signs: Temp: 98.1 F (36.7 C) (01/17 1659) Temp Source: Oral (01/17 1659) BP: 117/88 mmHg (01/17 2133) Pulse Rate: 90 (01/17 2133) Intake/Output from previous day:   Intake/Output from this shift:    Labs:  Recent Labs  03/23/15 1903  WBC 11.2*  HGB 13.6  PLT 260  CREATININE 1.19*   Estimated Creatinine Clearance: 72.5 mL/min (by C-G formula based on Cr of 1.19). No results for input(s): VANCOTROUGH, VANCOPEAK, VANCORANDOM, GENTTROUGH, GENTPEAK, GENTRANDOM, TOBRATROUGH, TOBRAPEAK, TOBRARND, AMIKACINPEAK, AMIKACINTROU, AMIKACIN in the last 72 hours.   Microbiology: No results found for this or any previous visit (from the past 720 hour(s)).  Medical History: Past Medical History  Diagnosis Date  . Hypothyroidism   . Hypertension   . Hyperlipidemia   . Sleep apnea   . Morbid obesity ([place])   . Osteoarthritis   . Asthma   . Osteoarthritis   . Vaginal Pap smear, abnormal   . Vaginal atrophy   . PMB (postmenopausal bleeding)   . Acute dermatitis   . COPD (chronic obstructive pulmonary disease) ([place])   . Depression   . GERD (gastroesophageal reflux disease)   . Incontinence     Medications:  Infusions:   Assessment: 74 yof cc leg swelling and redness, pharmacy consulted to dose vancomycin for cellulitis. No note of abscess/purulence. Hospitalist noted that edema may be related to calcium channel blocker but is not holding that drug at this time.   Vd 77.3 L, Ke 0.064 hr-1, T1/2 10.8 hr  Goal of Therapy:  Vancomycin trough level 10-15 mcg/ml  Plan:  Expected duration 5 days with resolution of  temperature and/or normalization of WBC. Vancomycin 1 gm IV x 1 given in ED, will order additional 500 mg IV x 1 for total first dose of 1500 mg, followed by 1250 mg IV Q12H for predicted trough 14 mcg/mL. Pharmacy will continue to follow and adjust as needed to maintain trough 10 to 15 mcg/mL.  Laural Benes, Pharm.D., BCPS Clinical Pharmacist 03/23/2015,9:59 PM

## 2015-03-23 NOTE — ED Notes (Signed)
Hospitalist at bedside 

## 2015-03-23 NOTE — ED Notes (Signed)
Pt arrived via EMS from home after reporting increased leg swelling, redness and itchiness over the past 6 weeks. Pt reports legs have become worse over the past 2 days and pt has began shaking in her hands. Pt denies pain upon palpation. Legs are severely edematous. Pt is reported to have been able to ambulate on feet using a walker at home.

## 2015-03-23 NOTE — H&P (Signed)
[facility] NAME: [name]    MR#:  [id number]  DATE OF BIRTH:  [birth year]   DATE OF ADMISSION:  03/23/2015  PRIMARY CARE PHYSICIAN: No PCP Per Patient   REQUESTING/REFERRING PHYSICIAN: Marcelene Butte  CHIEF COMPLAINT:   Chief Complaint  Patient presents with  . Leg Swelling    HISTORY OF PRESENT ILLNESS:  [name]  is a 75 y.o. female with a known history of morbid obesity, essential hypertension presenting with lower extremity edema. sHe states she is progressively worsening edema for approximately 6 weeks total duration. With associated redness, warmth, itching. She also states having subjective fever/chills. Denies any chest pain, palpitations, orthopnea, shortness of breath other than dyspnea on exertion.  PAST MEDICAL HISTORY:   Past Medical History  Diagnosis Date  . Hypothyroidism   . Hypertension   . Hyperlipidemia   . Sleep apnea   . Morbid obesity ([place])   . Osteoarthritis   . Asthma   . Osteoarthritis   . Vaginal Pap smear, abnormal   . Vaginal atrophy   . PMB (postmenopausal bleeding)   . Acute dermatitis   . COPD (chronic obstructive pulmonary disease) ([place])   . Depression   . GERD (gastroesophageal reflux disease)   . Incontinence     PAST SURGICAL HISTORY:   Past Surgical History  Procedure Laterality Date  . Cholecystectomy    . Umbilical hernia repair    . Abdominal hysterectomy  2014    [facility]  . Dilation and curettage of uterus      SOCIAL HISTORY:   Social History  Substance Use Topics  . Smoking status: Former Research scientist (life sciences)  . Smokeless tobacco: Never Used  . Alcohol Use: No    FAMILY HISTORY:   Family History  Problem Relation Age of Onset  . Alzheimer's disease Mother   . Leukemia Mother   . Brain cancer Brother   . Heart disease Neg Hx   . Diabetes Neg Hx   . Breast cancer Neg Hx   . Ovarian cancer Neg Hx     DRUG ALLERGIES:   Allergies  Allergen Reactions  .  Vicodin [Hydrocodone-Acetaminophen] Other (See Comments)    Reaction:  Raises pts BP     REVIEW OF SYSTEMS:  REVIEW OF SYSTEMS:  CONSTITUTIONAL: Positive fevers, chills, fatigue, weakness.  EYES: Denies blurred vision, double vision, or eye pain.  EARS, NOSE, THROAT: Denies tinnitus, ear pain, hearing loss.  RESPIRATORY: denies cough, shortness of breath, wheezing  CARDIOVASCULAR: Denies chest pain, palpitations, positive edema.  GASTROINTESTINAL: Denies nausea, vomiting, diarrhea, abdominal pain.  GENITOURINARY: Denies dysuria, hematuria.  ENDOCRINE: Denies nocturia or thyroid problems. HEMATOLOGIC AND LYMPHATIC: Denies easy bruising or bleeding.  SKIN: Denies rash or lesions.  MUSCULOSKELETAL: Denies pain in neck, back, shoulder, knees, hips, or further arthritic symptoms.  NEUROLOGIC: Denies paralysis, paresthesias.  PSYCHIATRIC: Denies anxiety or depressive symptoms. Otherwise full review of systems performed by me is negative.   MEDICATIONS AT HOME:   Prior to Admission medications   Medication Sig Start Date End Date Taking? Authorizing Provider  albuterol (PROVENTIL HFA;VENTOLIN HFA) 108 (90 BASE) MCG/ACT inhaler Inhale 2 puffs into the lungs every 6 (six) hours as needed for wheezing or shortness of breath.    Yes Historical Provider, MD  amLODipine (NORVASC) 5 MG tablet Take 1 tablet (5 mg total) by mouth daily. 11/07/12  Yes Einar Pheasant, MD  aspirin 81 MG chewable tablet Chew 81 mg by mouth  daily.   Yes Historical Provider, MD  atorvastatin (LIPITOR) 20 MG tablet Take 20 mg by mouth daily.   Yes Historical Provider, MD  Calcium Carbonate-Vitamin D (CALCIUM 600+D) 600-400 MG-UNIT tablet Take 1 tablet by mouth 2 (two) times daily.   Yes Historical Provider, MD  cholecalciferol (VITAMIN D) 1000 units tablet Take 2,000 Units by mouth daily.   Yes Historical Provider, MD  furosemide (LASIX) 20 MG tablet Take 1 tablet (20 mg total) by mouth daily. 11/07/12  Yes Einar Pheasant, MD   glucosamine-chondroitin 500-400 MG tablet Take 1 tablet by mouth daily.   Yes Historical Provider, MD  levothyroxine (SYNTHROID, LEVOTHROID) 125 MCG tablet Take 125 mcg by mouth daily before breakfast.   Yes Historical Provider, MD  nystatin cream (MYCOSTATIN) Apply 1 application topically 2 (two) times daily as needed (for irritation).   Yes Historical Provider, MD  Polyvinyl Alcohol-Povidone (REFRESH OP) Apply 1 drop to eye as needed (for dry eyes).   Yes Historical Provider, MD  potassium chloride SA (K-DUR,KLOR-CON) 20 MEQ tablet Take 1 tablet (20 mEq total) by mouth daily. 09/23/12  Yes Einar Pheasant, MD      VITAL SIGNS:  Blood pressure 117/88, pulse 90, temperature 98.1 F (36.7 C), temperature source Oral, resp. rate 18, height 5\' 6"  (1.676 m), weight 414 lb (187.789 kg), last menstrual period 04/16/1993, SpO2 96 %.  PHYSICAL EXAMINATION:  VITAL SIGNS: Filed Vitals:   03/23/15 2050 03/23/15 2133  BP: 160/51 117/88  Pulse: 80 90  Temp:    Resp: 16 18   GENERAL:74 y.o.female currently in no acute distress.  HEAD: Normocephalic, atraumatic.  EYES: Pupils equal, round, reactive to light. Extraocular muscles intact. No scleral icterus.  MOUTH: Moist mucosal membrane. Dentition intact. No abscess noted.  EAR, NOSE, THROAT: Clear without exudates. No external lesions.  NECK: Supple. No thyromegaly. No nodules. No JVD.  PULMONARY: Clear to ascultation, without wheeze rails or rhonci. No use of accessory muscles, Good respiratory effort. good air entry bilaterally CHEST: Nontender to palpation.  CARDIOVASCULAR: S1 and S2. Regular rate and rhythm. No murmurs, rubs, or gallops. 2+ edema. Pedal pulses 2+ bilaterally.  GASTROINTESTINAL: Soft, nontender, nondistended. No masses. Positive bowel sounds. No hepatosplenomegaly.  MUSCULOSKELETAL: No swelling, clubbing, or edema. Range of motion full in all extremities.  NEUROLOGIC: Cranial nerves II through XII are intact. No gross focal  neurological deficits. Sensation intact. Reflexes intact.  SKIN: Bilateral lower extremity skin skin changes consistent with chronic edema however, new erythema, warm to touch to mid shin, No ulceration, lesions, rashes, or cyanosis. Skin warm and dry. Turgor intact.  PSYCHIATRIC: Mood, affect within normal limits. The patient is awake, alert and oriented x 3. Insight, judgment intact.    LABORATORY PANEL:   CBC  Recent Labs Lab 03/23/15 1903  WBC 11.2*  HGB 13.6  HCT 41.3  PLT 260   ------------------------------------------------------------------------------------------------------------------  Chemistries   Recent Labs Lab 03/23/15 1903  NA 140  K 4.3  CL 104  CO2 28  GLUCOSE 119*  BUN 20  CREATININE 1.19*  CALCIUM 9.3  MG 1.9  AST 24  ALT 25  ALKPHOS 60  BILITOT 0.5   ------------------------------------------------------------------------------------------------------------------  Cardiac Enzymes No results for input(s): TROPONINI in the last 168 hours. ------------------------------------------------------------------------------------------------------------------  RADIOLOGY:  Dg Chest 2 View  03/23/2015  CLINICAL DATA:  Chronic bilateral leg swelling and rash, acutely worsened. Initial encounter. EXAM: CHEST  2 VIEW COMPARISON:  None. FINDINGS: The lungs are well-aerated. A small left pleural effusion is  suspected, with mild left basilar atelectasis. There is no evidence of pneumothorax. The heart is borderline enlarged. No acute osseous abnormalities are seen. IMPRESSION: Suspect small left pleural effusion, with mild left basilar atelectasis. Borderline cardiomegaly. Electronically Signed   By: Garald Balding M.D.   On: 03/23/2015 18:42    EKG:   Orders placed or performed in visit on 05/02/12  . EKG    IMPRESSION AND PLAN:   75 year old Caucasian female history of morbid obesity and essential hypertension presenting with leg swelling and  redness  1. Cellulitis lower extremity: Vancomycin, blood cultures, we will attempt to continue diuresis given edema. I suspect she does have element of chronic edema/lymphedema but redness is new 2. Hypertension essential: Norvasc-question if this is the best long-term medication for her as a side effect is lower extremity edema 3. Hypothyroidism unspecified: Synthroid 4. Venous thromboembolic embolism prophylactic: Heparin subcutaneous    All the records are reviewed and case discussed with ED provider. Management plans discussed with the patient, family and they are in agreement.  CODE STATUS: Full  TOTAL TIME TAKING CARE OF THIS PATIENT: 35 minutes.    Blayden Conwell,  Karenann Cai.D on 03/23/2015 at 9:39 PM  Between 7am to 6pm - Pager - [phone]  After 6pm: House Pager: - [phone]  [facility]  Office  [phone]  CC: Primary care physician; No PCP Per Patient

## 2015-03-24 DIAGNOSIS — L8990 Pressure ulcer of unspecified site, unspecified stage: Secondary | ICD-10-CM | POA: Diagnosis present

## 2015-03-24 LAB — BASIC METABOLIC PANEL
Anion gap: 5 (ref 5–15)
BUN: 22 mg/dL — ABNORMAL HIGH (ref 6–20)
CO2: 29 mmol/L (ref 22–32)
Calcium: 9.1 mg/dL (ref 8.9–10.3)
Chloride: 103 mmol/L (ref 101–111)
Creatinine, Ser: 1.3 mg/dL — ABNORMAL HIGH (ref 0.44–1.00)
GFR calc Af Amer: 46 mL/min — ABNORMAL LOW (ref >60)
GFR calc non Af Amer: 39 mL/min — ABNORMAL LOW (ref >60)
Glucose, Bld: 120 mg/dL — ABNORMAL HIGH (ref 65–99)
Potassium: 3.7 mmol/L (ref 3.5–5.1)
Sodium: 137 mmol/L (ref 135–145)

## 2015-03-24 LAB — CBC
HCT: 39.3 % (ref 35.0–47.0)
Hemoglobin: 12.9 g/dL (ref 12.0–16.0)
MCH: 30.1 pg (ref 26.0–34.0)
MCHC: 32.7 g/dL (ref 32.0–36.0)
MCV: 92 fL (ref 80.0–100.0)
Platelets: 236 10*3/uL (ref 150–440)
RBC: 4.27 MIL/uL (ref 3.80–5.20)
RDW: 13.7 % (ref 11.5–14.5)
WBC: 15.4 10*3/uL — ABNORMAL HIGH (ref 3.6–11.0)

## 2015-03-24 MED ORDER — ENOXAPARIN SODIUM 40 MG/0.4ML ~~LOC~~ SOLN
40.0000 mg | Freq: Two times a day (BID) | SUBCUTANEOUS | Status: DC
  Administered 2015-03-24 – 2015-03-25 (×3): 40 mg via SUBCUTANEOUS
  Filled 2015-03-24 (×3): qty 0.4

## 2015-03-24 MED ORDER — FUROSEMIDE 40 MG PO TABS
40.0000 mg | ORAL_TABLET | Freq: Every day | ORAL | Status: DC
  Administered 2015-03-25: 40 mg via ORAL
  Filled 2015-03-24: qty 1

## 2015-03-24 MED ORDER — VANCOMYCIN HCL 500 MG IV SOLR
500.0000 mg | Freq: Once | INTRAVENOUS | Status: AC
  Administered 2015-03-24: 500 mg via INTRAVENOUS
  Filled 2015-03-24: qty 500

## 2015-03-24 MED ORDER — FUROSEMIDE 10 MG/ML IJ SOLN
20.0000 mg | Freq: Two times a day (BID) | INTRAMUSCULAR | Status: DC
  Administered 2015-03-24: 20 mg via INTRAVENOUS
  Filled 2015-03-24: qty 2

## 2015-03-24 MED ORDER — POTASSIUM CHLORIDE CRYS ER 20 MEQ PO TBCR
40.0000 meq | EXTENDED_RELEASE_TABLET | Freq: Once | ORAL | Status: AC
  Administered 2015-03-24: 40 meq via ORAL
  Filled 2015-03-24: qty 2

## 2015-03-24 NOTE — Clinical Social Work Placement (Signed)
   CLINICAL SOCIAL WORK PLACEMENT  NOTE  Date:  03/24/2015  Patient Details  Name: [name] [facility] MRN: [id number] Date of Birth: 1940/12/24  Clinical Social Work is seeking post-discharge placement for this patient at the [place] level of care (*CSW will initial, date and re-position this form in  chart as items are completed):  Yes   Patient/family provided with [facility] Work Department's list of facilities offering this level of care within the geographic area requested by the patient (or if unable, by the patient's family).  Yes   Patient/family informed of their freedom to choose among providers that offer the needed level of care, that participate in Medicare, Medicaid or managed care program needed by the patient, have an available bed and are willing to accept the patient.  Yes   Patient/family informed of [facility]'s ownership interest in [facility] and [facility], as well as of the fact that they are under no obligation to receive care at these facilities.  PASRR submitted to EDS on       PASRR number received on       Existing PASRR number confirmed on 03/24/15     FL2 transmitted to all facilities in geographic area requested by pt/family on 03/24/15     FL2 transmitted to all facilities within larger geographic area on       Patient informed that his/her managed care company has contracts with or will negotiate with certain facilities, including the following:            Patient/family informed of bed offers received.  Patient chooses bed at       Physician recommends and patient chooses bed at      Patient to be transferred to   on  .  Patient to be transferred to facility by       Patient family notified on   of transfer.  Name of family member notified:        PHYSICIAN       Additional Comment:    _______________________________________________ Loralyn Freshwater, LCSW 03/24/2015, 4:07 PM

## 2015-03-24 NOTE — NC FL2 (Signed)
[place] LEVEL OF CARE SCREENING TOOL     IDENTIFICATION  Patient Name: [name] [facility] Birthdate: [birth year] Sex: female Admission Date (Current Location): 03/23/2015  [place] and [place] Number:  Engineering geologist and Address:  [facility], [address] [place], [place] [address]      Provider Number: [id number]  Attending Physician Name and Address:  Hillary Bow, MD  Relative Name and Phone Number:       Current Level of Care: Hospital Recommended Level of Care: [place] Prior Approval Number:    Date Approved/Denied:   PASRR Number:  ( [id number] )  Discharge Plan: SNF    Current Diagnoses: Patient Active Problem List   Diagnosis Date Noted  . Pressure ulcer 03/24/2015  . Cellulitis 03/23/2015  . Vaginal atrophy 08/19/2014  . Asthma, chronic 04/18/2012  . Obstructive sleep apnea 04/18/2012  . Essential hypertension, benign 04/18/2012  . Hypercholesterolemia 04/18/2012  . Aortic stenosis 04/18/2012  . Endometrial cancer ([place]) 04/18/2012  . Diverticulosis of colon without hemorrhage 04/18/2012  . Unspecified hypothyroidism 04/18/2012  . Overactive bladder 04/18/2012    Orientation RESPIRATION BLADDER Height & Weight    Self, Time, Situation, Place  Normal Incontinent 5\' 6"  (167.6 cm) 407 lbs.  BEHAVIORAL SYMPTOMS/MOOD NEUROLOGICAL BOWEL NUTRITION STATUS   (none )  (none ) Continent Diet (Diet: Heart Healthy )  AMBULATORY STATUS COMMUNICATION OF NEEDS Skin   Extensive Assist Verbally PU Stage and Appropriate Care (Pressure Ulcer Unstageable on Buttocks. )                       Personal Care Assistance Level of Assistance  Bathing, Feeding, Dressing Bathing Assistance: Limited assistance Feeding assistance: Independent Dressing Assistance: Limited assistance     Functional Limitations Info  Sight, Hearing, Speech Sight Info: Adequate Hearing Info: Adequate Speech Info: Adequate     SPECIAL CARE FACTORS FREQUENCY  PT (By licensed PT)     PT Frequency:  (5)              Contractures      Additional Factors Info  Code Status, Allergies Code Status Info:  (Full Code. ) Allergies Info:  (Vicodin Hydrocodone-acetaminophen)           Current Medications (03/24/2015):  This is the current hospital active medication list Current Facility-Administered Medications  Medication Dose Route Frequency Provider Last Rate Last Dose  . acetaminophen (TYLENOL) tablet 650 mg  650 mg Oral Q6H PRN Lytle Butte, MD   650 mg at 03/23/15 2254   Or  . acetaminophen (TYLENOL) suppository 650 mg  650 mg Rectal Q6H PRN Lytle Butte, MD      . amLODipine (NORVASC) tablet 5 mg  5 mg Oral Daily Lytle Butte, MD   5 mg at 03/24/15 0911  . aspirin chewable tablet 81 mg  81 mg Oral Daily Lytle Butte, MD   81 mg at 03/24/15 0911  . atorvastatin (LIPITOR) tablet 20 mg  20 mg Oral QHS Lytle Butte, MD      . calcium-vitamin D (OSCAL WITH D) 500-200 MG-UNIT per tablet 1 tablet  1 tablet Oral BID Lytle Butte, MD   1 tablet at 03/24/15 0911  . cholecalciferol (VITAMIN D) tablet 2,000 Units  2,000 Units Oral Daily Lytle Butte, MD   2,000 Units at 03/24/15 0911  . enoxaparin (LOVENOX) injection 40 mg  40 mg Subcutaneous Q12H Hillary Bow, MD  40 mg at 03/24/15 1436  . [START ON 03/25/2015] furosemide (LASIX) tablet 40 mg  40 mg Oral Daily Srikar Sudini, MD      . hydrOXYzine (ATARAX/VISTARIL) tablet 25 mg  25 mg Oral TID PRN Lytle Butte, MD      . levothyroxine (SYNTHROID, LEVOTHROID) tablet 125 mcg  125 mcg Oral QAC breakfast Lytle Butte, MD   125 mcg at 03/24/15 0911  . morphine 2 MG/ML injection 2 mg  2 mg Intravenous Q4H PRN Lytle Butte, MD      . nystatin cream (MYCOSTATIN) 1 application  1 application Topical BID PRN Lytle Butte, MD      . ondansetron [facility]) tablet 4 mg  4 mg Oral Q6H PRN Lytle Butte, MD       Or  . ondansetron [facility]) injection 4 mg  4 mg  Intravenous Q6H PRN Lytle Butte, MD      . oxyCODONE (Oxy IR/ROXICODONE) immediate release tablet 5 mg  5 mg Oral Q4H PRN Lytle Butte, MD   5 mg at 03/24/15 [id number]  . polyvinyl alcohol (LIQUIFILM TEARS) 1.4 % ophthalmic solution 1 drop  1 drop Both Eyes BID PRN Lytle Butte, MD      . vancomycin (VANCOCIN) 1,250 mg in sodium chloride 0.9 % 250 mL IVPB  1,250 mg Intravenous Q12H Lytle Butte, MD   1,250 mg at 03/24/15 [id number]     Discharge Medications: Please see discharge summary for a list of discharge medications.  Relevant Imaging Results:  Relevant Lab Results:   Additional Information  (SSN: [id number])  Loralyn Freshwater, LCSW

## 2015-03-24 NOTE — Progress Notes (Signed)
Pharmacy Antibiotic Follow-up Note  [name] is a 75 y.o. year-old female admitted on 03/23/2015.  The patient is currently on day 2 of vancomycin for cellulitis.  Assessment/Plan: After discussion with Dr. Geanie Berlin, states this is not the patients first time with cellulitis, was treated with vancomycin last time and had good results. Suggested due to the lack of purulence to use clindamycin. Dr. Geanie Berlin would like to continue vancomycin for now. Recommended discontinuing amlodipine due to chronic leg swelling. Per Dr. Darvin Neighbours, does not believe swelling is extensive, its more excess body tissue/body mass.   Temp (24hrs), Avg:98.1 F (36.7 C), Min:97.7 F (36.5 C), Max:98.9 F (37.2 C)   Recent Labs Lab 03/23/15 1903 03/24/15 0848  WBC 11.2* 15.4*    Recent Labs Lab 03/23/15 1903 03/24/15 0848  CREATININE 1.19* 1.30*   Estimated Creatinine Clearance: 65.6 mL/min (by C-G formula based on Cr of 1.3).    Allergies  Allergen Reactions  . Vicodin [Hydrocodone-Acetaminophen] Other (See Comments)    Reaction:  Raises pts BP     Antimicrobials this admission: 1/17 vancomycin >>    Levels/dose changes this admission:   Microbiology results: 1/17 BCx: in progress   Thank you for allowing pharmacy to be a part of this patient's care.  Ramond Dial PharmD 03/24/2015 1:54 PM
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
## 2015-03-24 NOTE — Evaluation (Signed)
Physical Therapy Evaluation Patient Details Name: [name] [facility] MRN: [id number] DOB: 05/08/1940 Today's Date: 03/24/2015   History of Present Illness  Patient is a 75 y/o female that presents with increase in essential tremors and bilateral cellulitis.   Clinical Impression  Patient is a 75 y/o female that presents from home with increased bilateral LE swelling, redness deemed to be cellulitis. Patient is morbidly obese, and reports chronic R knee pain which limits her mobility. She is able to ambulate short distances around the house with a RW, and uses the countertops while cooking or cleaning. She reports no falls recently, however in this session she states her R knee feels as if it will buckle under her during standing attempt. She is significantly deconditioned, and requires +2 max A for bed mobility as she typically sleeps in a recliner. She is unsafe/unfit to ambulate safely at this time given her deconditioning and RLE pain. Patient would benefit from additional skilled PT services to increase her independence with mobility for return to her previous living establishment, if further ambulation is deemed not-appropriate she may benefit from a wheelchair for further mobility.    Follow Up Recommendations SNF    Equipment Recommendations   (May be more appropriate for wheelchair if she cannot tolerate standing?)    Recommendations for Other Services       Precautions / Restrictions Precautions Precautions: Fall Restrictions Weight Bearing Restrictions: No      Mobility  Bed Mobility Overal bed mobility: +2 for physical assistance;Needs Assistance Bed Mobility: Supine to Sit;Sit to Supine     Supine to sit: Max assist;+2 for physical assistance Sit to supine: Max assist;+2 for physical assistance   General bed mobility comments: Patient is extremely deconditioned and typically sleeps in a recliner, she is unable to provide assistance through LEs secondary to weakness/pain  and is unable to provide effort through trunk secondary to body size/weakness.   Transfers Overall transfer level: Needs assistance Equipment used: Rolling walker (2 wheeled) Transfers: Sit to/from Stand Sit to Stand: Max assist;+2 physical assistance         General transfer comment: Patient begins to complain of R knee pain in standing stating that she feels it will buckle on her. Unable to tolerate standing at this time.   Ambulation/Gait             General Gait Details: Deferred as patient is unable to tolerate standing at this time.   Stairs            Wheelchair Mobility    Modified Rankin (Stroke Patients Only)       Balance Overall balance assessment: Needs assistance   Sitting balance-Leahy Scale: Fair       Standing balance-Leahy Scale: Poor                               Pertinent Vitals/Pain Pain Assessment: Faces Faces Pain Scale: Hurts even more Pain Location: R knee pain with weightbearing.  Pain Descriptors / Indicators: Aching Pain Intervention(s): Limited activity within patient's tolerance;Monitored during session;Repositioned    Home Living Family/patient expects to be discharged to:: Private residence Living Arrangements: Spouse/significant other Available Help at Discharge:  (Patient's husband has Alzheimer's and is unable to care for her) Type of Home: House Home Access: Ramped entrance     Home Layout: One level Home Equipment: Walker - 2 wheels      Prior Function Level of Independence: Independent with  assistive device(s)         Comments: Patient reports no falls in limited distance ambulated in house.      Hand Dominance        Extremity/Trunk Assessment   Upper Extremity Assessment: Generalized weakness           Lower Extremity Assessment: Generalized weakness;RLE deficits/detail;LLE deficits/detail RLE Deficits / Details: Unable to flex secondary to increased pain, unable to complete  SLR secondary to pain  LLE Deficits / Details: Unable to complete SLR due to body size, weakness, pain.      Communication   Communication: No difficulties  Cognition Arousal/Alertness: Awake/alert Behavior During Therapy: WFL for tasks assessed/performed;Anxious Overall Cognitive Status: Within Functional Limits for tasks assessed                      General Comments General comments (skin integrity, edema, etc.): Redness and itchy skin noted in bilateral LEs.     Exercises        Assessment/Plan    PT Assessment Patient needs continued PT services  PT Diagnosis Difficulty walking;Generalized weakness   PT Problem List Decreased strength;Decreased knowledge of use of DME;Pain;Decreased activity tolerance;Decreased balance;Cardiopulmonary status limiting activity;Decreased mobility;Obesity  PT Treatment Interventions Gait training;DME instruction;Therapeutic activities;Therapeutic exercise;Balance training;Wheelchair mobility training   PT Goals (Current goals can be found in the Care Plan section) Acute Rehab PT Goals Patient Stated Goal: To return home.  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     Co-evaluation               End of Session Equipment Utilized During Treatment: Gait belt Activity Tolerance: Patient limited by pain Patient left: in bed;with call bell/phone within reach;with bed alarm set Nurse Communication: Mobility status         Time: [id number] PT Time Calculation (min) (ACUTE ONLY): 22 min   Charges:   PT Evaluation $PT Eval Low Complexity: 1 Procedure     PT G Codes:       Kerman Passey, PT, DPT    03/24/2015, 3:04 PM

## 2015-03-24 NOTE — Progress Notes (Signed)
[place] [facility] [place] NAME: [name]    MR#:  [id number]  DATE OF BIRTH:  October 28, 1940  SUBJECTIVE:  CHIEF COMPLAINT:   Chief Complaint  Patient presents with  . Leg Swelling   Still has redness in legs. Swelling some improved. Still feels very weak. Lives alone Afebrile. No more SOB than has been chronically  REVIEW OF SYSTEMS:    Review of Systems  Constitutional: Positive for malaise/fatigue. Negative for fever and chills.  HENT: Negative for sore throat.   Eyes: Negative for blurred vision, double vision and pain.  Respiratory: Negative for cough, hemoptysis, shortness of breath and wheezing.   Cardiovascular: Negative for chest pain, palpitations, orthopnea and leg swelling.  Gastrointestinal: Negative for heartburn, nausea, vomiting, abdominal pain, diarrhea and constipation.  Genitourinary: Negative for dysuria and hematuria.  Musculoskeletal: Positive for back pain. Negative for joint pain.  Skin: Positive for rash.  Neurological: Negative for sensory change, speech change, focal weakness and headaches.  Endo/Heme/Allergies: Does not bruise/bleed easily.  Psychiatric/Behavioral: Negative for depression. The patient is not nervous/anxious.       DRUG ALLERGIES:   Allergies  Allergen Reactions  . Vicodin [Hydrocodone-Acetaminophen] Other (See Comments)    Reaction:  Raises pts BP     VITALS:  Blood pressure 142/59, pulse 99, temperature 98.9 F (37.2 C), temperature source Oral, resp. rate 17, height 5\' 6"  (1.676 m), weight 184.75 kg (407 lb 4.8 oz), last menstrual period 04/16/1993, SpO2 95 %.  PHYSICAL EXAMINATION:   Physical Exam  GENERAL:  75 y.o.-year-old patient lying in the bed with no acute distress. Morbidly obese EYES: Pupils equal, round, reactive to light and accommodation. No scleral icterus. Extraocular muscles intact.  HEENT: Head atraumatic, normocephalic. Oropharynx and nasopharynx clear.   NECK:  Supple, no jugular venous distention. No thyroid enlargement, no tenderness.  LUNGS: Normal breath sounds bilaterally, no wheezing, rales, rhonchi. No use of accessory muscles of respiration.  CARDIOVASCULAR: S1, S2 normal. No murmurs, rubs, or gallops.  ABDOMEN: Soft, nontender, nondistended. Bowel sounds present. No organomegaly or mass.  EXTREMITIES: No cyanosis, clubbing or edema b/l.    NEUROLOGIC: Cranial nerves II through XII are intact. No focal Motor or sensory deficits b/l.   PSYCHIATRIC: The patient is alert and oriented x 3.  SKIN: Redness and erythema b/l LE   LABORATORY PANEL:   CBC  Recent Labs Lab 03/24/15 0848  WBC 15.4*  HGB 12.9  HCT 39.3  PLT 236   ------------------------------------------------------------------------------------------------------------------  Chemistries   Recent Labs Lab 03/23/15 1903 03/24/15 0848  NA 140 137  K 4.3 3.7  CL 104 103  CO2 28 29  GLUCOSE 119* 120*  BUN 20 22*  CREATININE 1.19* 1.30*  CALCIUM 9.3 9.1  MG 1.9  --   AST 24  --   ALT 25  --   ALKPHOS 60  --   BILITOT 0.5  --    ------------------------------------------------------------------------------------------------------------------  Cardiac Enzymes No results for input(s): TROPONINI in the last 168 hours. ------------------------------------------------------------------------------------------------------------------  RADIOLOGY:  Dg Chest 2 View  03/23/2015  CLINICAL DATA:  Chronic bilateral leg swelling and rash, acutely worsened. Initial encounter. EXAM: CHEST  2 VIEW COMPARISON:  None. FINDINGS: The lungs are well-aerated. A small left pleural effusion is suspected, with mild left basilar atelectasis. There is no evidence of pneumothorax. The heart is borderline enlarged. No acute osseous abnormalities are seen. IMPRESSION: Suspect small left pleural effusion, with mild left basilar atelectasis. Borderline cardiomegaly.  Electronically Signed    By: Garald Balding M.D.   On: 03/23/2015 18:42     ASSESSMENT AND PLAN:   75 year old Caucasian female history of morbid obesity and essential hypertension presenting with leg swelling and redness  1. Cellulitis lower extremity: Vancomycin Can change to Keflex at d/c Continue lasix as diuresis should help with her swelling  2. Hypertension essential:  Continue home meds  3. Hypothyroidism unspecified: Synthroid  4. Venous thromboembolic embolism prophylactic: Heparin subcutaneous  SNF at d/c? Lives alone   All the records are reviewed and case discussed with Care Management/Social Workerr. Management plans discussed with the patient, family and they are in agreement.  CODE STATUS: FULL  DVT Prophylaxis: SCDs  TOTAL TIME TAKING CARE OF THIS PATIENT: 35 minutes.   POSSIBLE D/C IN 1-2 DAYS, DEPENDING ON CLINICAL CONDITION.   Hillary Bow R M.D on 03/24/2015 at 2:22 PM  Between 7am to 6pm - Pager - [phone]  After 6pm go to [email or web address] - password [email or web address] [facility]  [place] [facility]  Office  [phone]  CC: Primary care physician; No PCP Per Patient    Note: This dictation was prepared with Dragon dictation along with smaller phrase technology. Any transcriptional errors that result from this process are unintentional.

## 2015-03-25 MED ORDER — FUROSEMIDE 20 MG PO TABS: 20.0000 mg | ORAL_TABLET | Freq: Every day | ORAL | Status: DC

## 2015-03-25 MED ORDER — CEPHALEXIN 500 MG PO CAPS: 500.0000 mg | ORAL_CAPSULE | Freq: Three times a day (TID) | ORAL | Status: DC

## 2015-03-25 MED ORDER — OXYCODONE HCL 5 MG PO TABS: 5.0000 mg | ORAL_TABLET | ORAL | Status: DC | PRN

## 2015-03-25 MED ORDER — ACETAMINOPHEN 325 MG PO TABS: 650.0000 mg | ORAL_TABLET | Freq: Four times a day (QID) | ORAL | Status: DC | PRN

## 2015-03-25 NOTE — Clinical Social Work Placement (Signed)
   CLINICAL SOCIAL WORK PLACEMENT  NOTE  Date:  03/25/2015  Patient Details  Name: [name] [facility] MRN: [id number] Date of Birth: [birth year]  Clinical Social Work is seeking post-discharge placement for this patient at the [place] level of care (*CSW will initial, date and re-position this form in  chart as items are completed):  Yes   Patient/family provided with [place] Work Department's list of facilities offering this level of care within the geographic area requested by the patient (or if unable, by the patient's family).  Yes   Patient/family informed of their freedom to choose among providers that offer the needed level of care, that participate in Medicare, Medicaid or managed care program needed by the patient, have an available bed and are willing to accept the patient.  Yes   Patient/family informed of [place]'s ownership interest in [facility] and [facility], as well as of the fact that they are under no obligation to receive care at these facilities.  PASRR submitted to EDS on       PASRR number received on       Existing PASRR number confirmed on 03/24/15     FL2 transmitted to all facilities in geographic area requested by pt/family on 03/24/15     FL2 transmitted to all facilities within larger geographic area on       Patient informed that his/her managed care company has contracts with or will negotiate with certain facilities, including the following:        Yes   Patient/family informed of bed offers received.  Patient chooses bed at  [facility] )     Physician recommends and patient chooses bed at      Patient to be transferred to  [facility] ) on 03/25/15.  Patient to be transferred to facility by  [facility] )     Patient family notified on 03/25/15 of transfer.  Name of family member notified:   (Per patient she has notified her son of D/C today. )     PHYSICIAN    Additional Comment:    _______________________________________________ Loralyn Freshwater, LCSW 03/25/2015, 1:43 PM

## 2015-03-25 NOTE — Discharge Summary (Signed)
[place] at [place] NAME: [name]    MR#:  [id number]  DATE OF BIRTH:  Sep 25, 1940  DATE OF ADMISSION:  03/23/2015 ADMITTING PHYSICIAN: Lytle Butte, MD  DATE OF DISCHARGE: 03/25/2015 PRIMARY CARE PHYSICIAN: No PCP Per Patient    ADMISSION DIAGNOSIS:  Leg Pain  DISCHARGE DIAGNOSIS:  Principal Problem:   Cellulitis Active Problems:   Pressure ulcer   SECONDARY DIAGNOSIS:   Past Medical History  Diagnosis Date  . Hypothyroidism   . Hypertension   . Hyperlipidemia   . Sleep apnea   . Morbid obesity ([place])   . Osteoarthritis   . Asthma   . Osteoarthritis   . Vaginal Pap smear, abnormal   . Vaginal atrophy   . PMB (postmenopausal bleeding)   . Acute dermatitis   . COPD (chronic obstructive pulmonary disease) ([place])   . Depression   . GERD (gastroesophageal reflux disease)   . Incontinence     HOSPITAL COURSE:   75 year old Caucasian female history of morbid obesity and essential hypertension presenting with leg swelling and redness  1. Cellulitis lower extremity: Improved with Vancomycin Will discharge patient with by mouth Keflex Continue lasix as diuresis should help with her swelling Encouraged patient to elevate the legs with 3-4 pillows Unna wrap to BLLE  2. Hypertension essential: Currently blood pressure is low normal Holding home medication amlodipine   3. Hypothyroidism unspecified: Synthroid  4. Venous thromboembolic embolism prophylactic: Heparin subcutaneous given during the hospital course  Disposition-skilled nursing facility  DISCHARGE CONDITIONS:  FAIR  CONSULTS OBTAINED:  Treatment Team:  Lytle Butte, MD   PROCEDURES NONE  DRUG ALLERGIES:   Allergies  Allergen Reactions  . Vicodin [Hydrocodone-Acetaminophen] Other (See Comments)    Reaction:  Raises pts BP     DISCHARGE MEDICATIONS:   Current Discharge Medication List    START taking these medications   Details   acetaminophen (TYLENOL) 325 MG tablet Take 2 tablets (650 mg total) by mouth every 6 (six) hours as needed for mild pain (or Fever >/= 101).    cephALEXin (KEFLEX) 500 MG capsule Take 1 capsule (500 mg total) by mouth 3 (three) times daily. Qty: 21 capsule, Refills: 0    oxyCODONE (OXY IR/ROXICODONE) 5 MG immediate release tablet Take 1 tablet (5 mg total) by mouth every 4 (four) hours as needed for moderate pain. Qty: 30 tablet, Refills: 0      CONTINUE these medications which have CHANGED   Details  furosemide (LASIX) 20 MG tablet Take 1 tablet (20 mg total) by mouth daily. Qty: 30 tablet, Refills: 1      CONTINUE these medications which have NOT CHANGED   Details  albuterol (PROVENTIL HFA;VENTOLIN HFA) 108 (90 BASE) MCG/ACT inhaler Inhale 2 puffs into the lungs every 6 (six) hours as needed for wheezing or shortness of breath.     aspirin 81 MG chewable tablet Chew 81 mg by mouth daily.    atorvastatin (LIPITOR) 20 MG tablet Take 20 mg by mouth daily.    Calcium Carbonate-Vitamin D (CALCIUM 600+D) 600-400 MG-UNIT tablet Take 1 tablet by mouth 2 (two) times daily.    cholecalciferol (VITAMIN D) 1000 units tablet Take 2,000 Units by mouth daily.    glucosamine-chondroitin 500-400 MG tablet Take 1 tablet by mouth daily.    levothyroxine (SYNTHROID, LEVOTHROID) 125 MCG tablet Take 125 mcg by mouth daily before breakfast.    nystatin cream (MYCOSTATIN) Apply 1 application topically 2 (two)  times daily as needed (for irritation).    Polyvinyl Alcohol-Povidone (REFRESH OP) Apply 1 drop to eye as needed (for dry eyes).    potassium chloride SA (K-DUR,KLOR-CON) 20 MEQ tablet Take 1 tablet (20 mEq total) by mouth daily. Qty: 30 tablet, Refills: 5      STOP taking these medications     amLODipine (NORVASC) 5 MG tablet          DISCHARGE INSTRUCTIONS:   Activity as recommended by physical therapy, please keep legs elevated by 3-4 pillows Continue UNNA boot -BLLE and  discontinue on January 21 Follow-up with primary care physician at the facility in 2-3 days, primary care physician to consider repeating CBC and BMP in 3-5 days Diet-low salt, low-fat   DIET:  Low fat, Low cholesterol diet, low-salt  DISCHARGE CONDITION:  Fair  ACTIVITY:  Activity as tolerated PER PT  OXYGEN:  Home Oxygen: No.   Oxygen Delivery: room air  DISCHARGE LOCATION:  nursing home   If you experience worsening of your admission symptoms, develop shortness of breath, life threatening emergency, suicidal or homicidal thoughts you must seek medical attention immediately by calling 911 or calling your MD immediately  if symptoms less severe.  You Must read complete instructions/literature along with all the possible adverse reactions/side effects for all the Medicines you take and that have been prescribed to you. Take any new Medicines after you have completely understood and accpet all the possible adverse reactions/side effects.   Please note  You were cared for by a hospitalist during your hospital stay. If you have any questions about your discharge medications or the care you received while you were in the hospital after you are discharged, you can call the unit and asked to speak with the hospitalist on call if the hospitalist that took care of you is not available. Once you are discharged, your primary care physician will handle any further medical issues. Please note that NO REFILLS for any discharge medications will be authorized once you are discharged, as it is imperative that you return to your primary care physician (or establish a relationship with a primary care physician if you do not have one) for your aftercare needs so that they can reassess your need for medications and monitor your lab values.     Today  Chief Complaint  Patient presents with  . Leg Swelling   Patient's lower extremity is swelling and redness are significantly improved. Patient is  feeling much better. Wants to go to rehabilitation center  ROS: CONSTITUTIONAL: Denies fevers, chills. Admits fatigue/ weakness.  EYES: Denies blurry vision, double vision, eye pain. EARS, NOSE, THROAT: Denies tinnitus, ear pain, hearing loss. RESPIRATORY: Denies cough, wheeze, shortness of breath.  CARDIOVASCULAR: Denies chest pain, palpitations, edema.  GASTROINTESTINAL: Denies nausea, vomiting, diarrhea, abdominal pain. Denies bright red blood per rectum. GENITOURINARY: Denies dysuria, hematuria. ENDOCRINE: Denies nocturia or thyroid problems. HEMATOLOGIC AND LYMPHATIC: Denies easy bruising or bleeding. SKIN: improved rash or lesion. MUSCULOSKELETAL: Denies pain in neck, has chronic low back pain , denies shoulder, knees, hips or arthritic symptoms.  NEUROLOGIC: Denies paralysis, paresthesias.  PSYCHIATRIC: Denies anxiety or depressive symptoms.   VITAL SIGNS:  Blood pressure 95/68, pulse 85, temperature 98.4 F (36.9 C), temperature source Oral, resp. rate 18, height 5\' 6"  (1.676 m), weight 185.136 kg (408 lb 2.4 oz), last menstrual period 04/16/1993, SpO2 97 %.  I/O:   Intake/Output Summary (Last 24 hours) at 03/25/15 1153 Last data filed at 03/25/15 0800  Gross  per 24 hour  Intake    740 ml  Output      0 ml  Net    740 ml    PHYSICAL EXAMINATION:  GENERAL:  75 y.o.-year-old patient lying in the bed with no acute distress.  EYES: Pupils equal, round, reactive to light and accommodation. No scleral icterus. Extraocular muscles intact.  HEENT: Head atraumatic, normocephalic. Oropharynx and nasopharynx clear.  NECK:  Supple, no jugular venous distention. No thyroid enlargement, no tenderness.  LUNGS: Normal breath sounds bilaterally, no wheezing, rales,rhonchi or crepitation. No use of accessory muscles of respiration.  CARDIOVASCULAR: S1, S2 normal. No murmurs, rubs, or gallops.  ABDOMEN: Soft, non-tender, non-distended. Bowel sounds present. No organomegaly or mass.   EXTREMITIES: has 2+ pedal edema, lower extremity erythema and tenderness significantly improved, no cyanosis, or clubbing.  NEUROLOGIC: Cranial nerves II through XII are intact. Muscle strength 5/5 in all extremities. Sensation intact. Gait not checked.  PSYCHIATRIC: The patient is alert and oriented x 3.  SKIN: No obvious rash, lesion, or ulcer.   DATA REVIEW:   CBC  Recent Labs Lab 03/24/15 0848  WBC 15.4*  HGB 12.9  HCT 39.3  PLT 236    Chemistries   Recent Labs Lab 03/23/15 1903 03/24/15 0848  NA 140 137  K 4.3 3.7  CL 104 103  CO2 28 29  GLUCOSE 119* 120*  BUN 20 22*  CREATININE 1.19* 1.30*  CALCIUM 9.3 9.1  MG 1.9  --   AST 24  --   ALT 25  --   ALKPHOS 60  --   BILITOT 0.5  --     Cardiac Enzymes No results for input(s): TROPONINI in the last 168 hours.  Microbiology Results  Results for orders placed or performed during the hospital encounter of 03/23/15  Culture, blood (Routine X 2) w Reflex to ID Panel     Status: None (Preliminary result)   Collection Time: 03/23/15  7:03 PM  Result Value Ref Range Status   Specimen Description BLOOD LEFT ASSIST CONTROL  Final   Special Requests   Final    BOTTLES DRAWN AEROBIC AND ANAEROBIC [place] AERO 5CC ANA   Culture NO GROWTH < 24 HOURS  Final   Report Status PENDING  Incomplete  Culture, blood (Routine X 2) w Reflex to ID Panel     Status: None (Preliminary result)   Collection Time: 03/23/15  7:03 PM  Result Value Ref Range Status   Specimen Description BLOOD RIGHT HAND  Final   Special Requests   Final    BOTTLES DRAWN AEROBIC AND ANAEROBIC 6CC AERO 5CC ANA   Culture NO GROWTH < 24 HOURS  Final   Report Status PENDING  Incomplete    RADIOLOGY:  Dg Chest 2 View  03/23/2015  CLINICAL DATA:  Chronic bilateral leg swelling and rash, acutely worsened. Initial encounter. EXAM: CHEST  2 VIEW COMPARISON:  None. FINDINGS: The lungs are well-aerated. A small left pleural effusion is suspected, with mild left  basilar atelectasis. There is no evidence of pneumothorax. The heart is borderline enlarged. No acute osseous abnormalities are seen. IMPRESSION: Suspect small left pleural effusion, with mild left basilar atelectasis. Borderline cardiomegaly. Electronically Signed   By: Garald Balding M.D.   On: 03/23/2015 18:42    EKG:   Orders placed or performed in visit on 05/02/12  . EKG      Management plans discussed with the patient, family and they are in agreement.  CODE STATUS:  Code Status Orders        Start     Ordered   03/23/15 2044  Full code   Continuous     03/23/15 2043    Code Status History    Date Active Date Inactive Code Status Order ID Comments User Context   This patient has a current code status but no historical code status.    Advance Directive Documentation        Most Recent Value   Type of Advance Directive  Healthcare Power of Attorney   Pre-existing out of facility DNR order (yellow form or pink MOST form)     "MOST" Form in Place?        TOTAL TIME TAKING CARE OF THIS PATIENT: 45 minutes.    @[facility] @  on 03/25/2015 at 11:53 AM  Between 7am to 6pm - Pager - [phone]  After 6pm go to [email or web address] - password [email or web address] [facility]  [place] [facility]  Office  [phone]  CC: Primary care physician; No PCP Per Patient

## 2015-03-25 NOTE — Progress Notes (Signed)
Report called to [place] at [facility]. Ems called. Pt waiting on EMS.

## 2015-03-25 NOTE — Progress Notes (Signed)
Initial Nutrition Assessment   INTERVENTION:   Meals and Snacks: Cater to patient preferences, encourage protein sources for wound healing   NUTRITION DIAGNOSIS:   Increased nutrient needs related to wound healing as evidenced by estimated needs.  GOAL:   Patient will meet greater than or equal to 90% of their needs  MONITOR:    (Energy Intake, Glucose Profile, Electroltye and Renal Profile, Skin)  REASON FOR ASSESSMENT:    (Pressure Ulcer)    ASSESSMENT:    Pt admitted with lower extremity cellulitis, likely to be discharged today to [facility]. MD in with pt on rounds this am.  Past Medical History  Diagnosis Date  . Hypothyroidism   . Hypertension   . Hyperlipidemia   . Sleep apnea   . Morbid obesity ([place])   . Osteoarthritis   . Asthma   . Osteoarthritis   . Vaginal Pap smear, abnormal   . Vaginal atrophy   . PMB (postmenopausal bleeding)   . Acute dermatitis   . COPD (chronic obstructive pulmonary disease) ([place])   . Depression   . GERD (gastroesophageal reflux disease)   . Incontinence      Diet Order:  Diet Heart Room service appropriate?: Yes; Fluid consistency:: Thin    Current Nutrition: Per Nsg, pt ate 100% of breakfast this am.   Food/Nutrition-Related History: Per MST no decrease in appetite PTA.    Scheduled Medications:  . amLODipine  5 mg Oral Daily  . aspirin  81 mg Oral Daily  . atorvastatin  20 mg Oral QHS  . calcium-vitamin D  1 tablet Oral BID  . cholecalciferol  2,000 Units Oral Daily  . enoxaparin (LOVENOX) injection  40 mg Subcutaneous Q12H  . furosemide  40 mg Oral Daily  . levothyroxine  125 mcg Oral QAC breakfast  . vancomycin  1,250 mg Intravenous Q12H     Electrolyte/Renal Profile and Glucose Profile:   Recent Labs Lab 03/23/15 1903 03/24/15 0848  NA 140 137  K 4.3 3.7  CL 104 103  CO2 28 29  BUN 20 22*  CREATININE 1.19* 1.30*  CALCIUM 9.3 9.1  MG 1.9  --   GLUCOSE 119* 120*   Protein  Profile:  Recent Labs Lab 03/23/15 1903  ALBUMIN 4.3    Gastrointestinal Profile: Last BM:  03/23/2015   Nutrition-Focused Physical Exam Findings:  Unable to complete Nutrition-Focused physical exam at this time.    Weight Change: Per CHL weight gain   Skin:   (Unstageable buttocks pressure ulcer)   Height:   Ht Readings from Last 1 Encounters:  03/23/15 5\' 6"  (1.676 m)    Weight:   Wt Readings from Last 1 Encounters:  03/25/15 408 lb 2.4 oz (185.136 kg)   Wt Readings from Last 10 Encounters:  03/25/15 408 lb 2.4 oz (185.136 kg)  08/19/14 401 lb 11.2 oz (182.21 kg)  04/16/12 367 lb (166.47 kg)    Ideal Body Weight:   72kg  BMI:  Body mass index is 65.91 kg/(m^2).  Estimated Nutritional Needs:   Kcal:  using IBW of 72kg, BEE: 1332kcals, TEE: (IF 1.1-1.3)(AF 1.3) [id number]  Protein:  using IBW of 72kg, 86-108g protein (1.2-1.5g/kg)   Fluid:  using IBW of 72kg, 1800-2130mL of fluid (25-41mL/kg)    MODERATE Care Level  Dwyane Luo, RD, LDN Pager [phone] Weekend/On-Call Pager [phone]

## 2015-03-25 NOTE — Progress Notes (Signed)
Pts BP 95/68, MD Gouru notified. Per MD hold morning dose of Norvasc. Will continue to monitor.

## 2015-03-25 NOTE — Care Management Important Message (Signed)
Important Message  Patient Details  Name: [name] MRN: [id number] Date of Birth: January 12, 1941   Medicare Important Message Given:  Yes    Juliann Pulse A Fitzhugh Vizcarrondo 03/25/2015, 11:39 AM

## 2015-03-25 NOTE — Discharge Instructions (Signed)
Activity as recommended by physical therapy, please keep legs elevated by 3-4 pillows Continue UNNA boot -BLLE and discontinue on January 21 Follow-up with primary care physician at the facility in 2-3 days Diet-low salt, low-fat

## 2015-03-25 NOTE — Progress Notes (Signed)
Una-boots applied to bilateral lower extremities.

## 2015-03-25 NOTE — Progress Notes (Signed)
Physical Therapy Treatment Patient Details Name: [name] MRN: [id number] DOB: 09/02/1940 Today's Date: 03/25/2015    History of Present Illness Patient is a 74 y/o female that presents with increase in essential tremors and bilateral cellulitis.     PT Comments    Pt with less pain in R LE this session and able to complete Supine to Sit with Mod A +2; transfers sit<>stand with Mod A +2; and supine to sit with Max A +2 for LE management.  Pt stood this session for about 10 min with Min A +2 and able to perform lateral weight shifting with some increase in R LE pain, but no buckling.   Cont with POC.  Pt with overall improved functional mobility this session and progressing towards goals.   Follow Up Recommendations  SNF     Equipment Recommendations  Rolling walker with 5" wheels (bariatric)    Recommendations for Other Services       Precautions / Restrictions Precautions Precautions: Fall Restrictions Weight Bearing Restrictions: No    Mobility  Bed Mobility Overal bed mobility: Needs Assistance;+2 for physical assistance (+3 for rolling for personal hygeine) Bed Mobility: Supine to Sit;Sit to Supine     Supine to sit: Mod assist;+2 for physical assistance Sit to supine: Max assist;+2 for physical assistance   General bed mobility comments: Due to pt's large size it is very hard for her to move around in the bed and she typically does not sleep in a bed, so she is not conditioned to bed movements such as rolling.  Pt able to rotate upper truck to L<>R side but needs physical assist to rotate hips and legs.  Transfers Overall transfer level: Needs assistance Equipment used: Rolling walker (2 wheeled) (bariatric) Transfers: Sit to/from Stand Sit to Stand: Mod assist;+2 physical assistance         General transfer comment: Stood with good body mechanics in upright position with Min Guard assist to maintain standnig  balance.  Ambulation/Gait Ambulation/Gait assistance: Min assist Ambulation Distance (Feet): 3 Feet Assistive device: Rolling walker (2 wheeled) (bariatric) Gait Pattern/deviations: Step-through pattern     General Gait Details: Side steppping next to bed for 3 feet; standing marching z 15 reps x2; static and dynamic standing balance x 10 min.   Stairs            Wheelchair Mobility    Modified Rankin (Stroke Patients Only)       Balance Overall balance assessment: Needs assistance Sitting-balance support: Feet supported Sitting balance-Leahy Scale: Good     Standing balance support: Bilateral upper extremity supported Standing balance-Leahy Scale: Fair                      Cognition Arousal/Alertness: Awake/alert Behavior During Therapy: WFL for tasks assessed/performed;Anxious Overall Cognitive Status: Within Functional Limits for tasks assessed                      Exercises      General Comments        Pertinent Vitals/Pain Pain Location: R knee with weight shifting Pain Descriptors / Indicators: Aching Pain Intervention(s): Limited activity within patient's tolerance;Monitored during session    Home Living                      Prior Function            PT Goals (current goals can now be found in the care plan  section) Acute Rehab PT Goals Patient Stated Goal: To return home.  PT Goal Formulation: With patient Time For Goal Achievement: 04/07/15 Potential to Achieve Goals: Fair    Frequency  Min 2X/week    PT Plan Current plan remains appropriate    Co-evaluation             End of Session Equipment Utilized During Treatment: Gait belt Activity Tolerance: Patient limited by pain Patient left: in bed;with call bell/phone within reach;with bed alarm set     Time: [id number] PT Time Calculation (min) (ACUTE ONLY): 34 min  Charges:  $Therapeutic Exercise: 8-22 mins $Therapeutic Activity: 8-22 mins                     G Codes:      Kaiyla Stahly A Joycelynn Fritsche 2015/04/22, 10:56 AM

## 2015-03-25 NOTE — Progress Notes (Signed)
Patient is medically stable for D/C to [facility] today. Per [facility] admissions coordinator at [facility] patient is going to room 78-A. Health Team authorization has been received. Auth # [id number]. RN will call report and arrange EMS for transport. Clinical Education officer, museum (CSW) sent D/C Summary, FL2 and D/C packet to [employer] via [place]. Patient is aware of above. Per patient she has notified her son of D/C today. Please reconsult if future social work needs arise. CSW signing off.   Blima Rich, LCSW [phone]

## 2015-03-25 NOTE — Progress Notes (Signed)
Clinical Education officer, museum (CSW) presented bed offers to patient. She chose [facility]. [facility] admissions coordinator at [facility] is aware of accepted bed offer. [facility] Team Case Manager is aware of above. CSW will continue to follow and assist as needed.   Blima Rich, LCSW [phone]

## 2015-03-28 LAB — CULTURE, BLOOD (ROUTINE X 2)
Culture: NO GROWTH
Culture: NO GROWTH

## 2015-10-08 ENCOUNTER — Emergency Department: Payer: PPO

## 2015-10-08 ENCOUNTER — Emergency Department
Admission: EM | Admit: 2015-10-08 | Discharge: 2015-10-08 | Disposition: A | Discharge status: Home / Self Care | Payer: PPO | Attending: Emergency Medicine | Admitting: Emergency Medicine

## 2015-10-08 DIAGNOSIS — Z79899 Other long term (current) drug therapy: Secondary | ICD-10-CM | POA: Insufficient documentation

## 2015-10-08 DIAGNOSIS — E039 Hypothyroidism, unspecified: Secondary | ICD-10-CM | POA: Insufficient documentation

## 2015-10-08 DIAGNOSIS — Z87891 Personal history of nicotine dependence: Secondary | ICD-10-CM | POA: Insufficient documentation

## 2015-10-08 DIAGNOSIS — J45909 Unspecified asthma, uncomplicated: Secondary | ICD-10-CM | POA: Insufficient documentation

## 2015-10-08 DIAGNOSIS — R109 Unspecified abdominal pain: Secondary | ICD-10-CM | POA: Diagnosis not present

## 2015-10-08 DIAGNOSIS — Z7982 Long term (current) use of aspirin: Secondary | ICD-10-CM | POA: Insufficient documentation

## 2015-10-08 DIAGNOSIS — R1030 Lower abdominal pain, unspecified: Secondary | ICD-10-CM | POA: Diagnosis not present

## 2015-10-08 DIAGNOSIS — J449 Chronic obstructive pulmonary disease, unspecified: Secondary | ICD-10-CM | POA: Insufficient documentation

## 2015-10-08 LAB — COMPREHENSIVE METABOLIC PANEL
ALT: 25 U/L (ref 14–54)
AST: 31 U/L (ref 15–41)
Albumin: 4.2 g/dL (ref 3.5–5.0)
Alkaline Phosphatase: 55 U/L (ref 38–126)
Anion gap: 8 (ref 5–15)
BUN: 15 mg/dL (ref 6–20)
CO2: 29 mmol/L (ref 22–32)
Calcium: 9.6 mg/dL (ref 8.9–10.3)
Chloride: 101 mmol/L (ref 101–111)
Creatinine, Ser: 0.95 mg/dL (ref 0.44–1.00)
GFR calc Af Amer: >60 mL/min (ref >60)
GFR calc non Af Amer: 58 mL/min — ABNORMAL LOW (ref >60)
Glucose, Bld: 133 mg/dL — ABNORMAL HIGH (ref 65–99)
Potassium: 4.4 mmol/L (ref 3.5–5.1)
Sodium: 138 mmol/L (ref 135–145)
Total Bilirubin: 0.7 mg/dL (ref 0.3–1.2)
Total Protein: 7.6 g/dL (ref 6.5–8.1)

## 2015-10-08 LAB — CBC WITH DIFFERENTIAL/PLATELET
Basophils Absolute: 0.1 10*3/uL (ref 0–0.1)
Basophils Relative: 1 %
Eosinophils Absolute: 0.1 10*3/uL (ref 0–0.7)
Eosinophils Relative: 1 %
HCT: 41.2 % (ref 35.0–47.0)
Hemoglobin: 14.3 g/dL (ref 12.0–16.0)
Lymphocytes Relative: 18 %
Lymphs Abs: 1.4 10*3/uL (ref 1.0–3.6)
MCH: 31.7 pg (ref 26.0–34.0)
MCHC: 34.7 g/dL (ref 32.0–36.0)
MCV: 91.4 fL (ref 80.0–100.0)
Monocytes Absolute: 0.6 10*3/uL (ref 0.2–0.9)
Monocytes Relative: 7 %
Neutro Abs: 5.8 10*3/uL (ref 1.4–6.5)
Neutrophils Relative %: 73 %
Platelets: 233 10*3/uL (ref 150–440)
RBC: 4.51 MIL/uL (ref 3.80–5.20)
RDW: 13.4 % (ref 11.5–14.5)
WBC: 8 10*3/uL (ref 3.6–11.0)

## 2015-10-08 LAB — URINALYSIS COMPLETE WITH MICROSCOPIC (ARMC ONLY)
Bacteria, UA: NONE SEEN
Bilirubin Urine: NEGATIVE
Glucose, UA: NEGATIVE mg/dL
Hgb urine dipstick: NEGATIVE
Ketones, ur: NEGATIVE mg/dL
Leukocytes, UA: NEGATIVE
Nitrite: NEGATIVE
Protein, ur: NEGATIVE mg/dL
Specific Gravity, Urine: 1.003 — ABNORMAL LOW (ref 1.005–1.030)
Squamous Epithelial / LPF: NONE SEEN
WBC, UA: NONE SEEN WBC/hpf (ref 0–5)
pH: 5 (ref 5.0–8.0)

## 2015-10-08 LAB — LACTIC ACID, PLASMA
Lactic Acid, Venous: 2.1 mmol/L (ref 0.5–1.9)
Lactic Acid, Venous: 1.2 mmol/L (ref 0.5–1.9)

## 2015-10-08 LAB — TROPONIN I: Troponin I: <0.03 ng/mL (ref <0.03)

## 2015-10-08 LAB — LIPASE, BLOOD: Lipase: 21 U/L (ref 11–51)

## 2015-10-08 MED ORDER — SODIUM CHLORIDE 0.9 % IV BOLUS (SEPSIS)
1000.0000 mL | Freq: Once | INTRAVENOUS | Status: AC
  Administered 2015-10-08: 1000 mL via INTRAVENOUS

## 2015-10-08 NOTE — Discharge Instructions (Signed)
You were evaluated for left-sided flank pain, and although no certain cause was found, your exam and evaluation are reassuring in emergency department today.  Return to emergency department for any worsening condition including abdominal pain, black or bloody stools, fever, vomiting, numbness or tingling, or any other symptoms concerning to you.

## 2015-10-08 NOTE — ED Notes (Signed)
Pt transferred to another bed to obtain accurate weight - 401.3lbs

## 2015-10-08 NOTE — ED Notes (Signed)
Lab called critical lab - lactic 2.1 - will report to Dr Reita Cliche

## 2015-10-08 NOTE — ED Triage Notes (Signed)
Pt arrived via ems for c/o left sided flank pain at hip level and waist line - she states the area is swollen and pain is off and on - she had xray in home 3 days ago and they told her that "food is not being digested normally and I have mild colonic ileus" - she states she is having normal bowel movements - UTI for the last 3-4 weeks but states that frequency only started when she started atb for uti - at this time in no pain

## 2015-10-08 NOTE — ED Provider Notes (Signed)
-----------------------------------------   10:40 PM on 10/08/2015 -----------------------------------------   Blood pressure 138/65, pulse 77, temperature 97.6 F (36.4 C), temperature source Oral, resp. rate 17, height 5\' 7"  (1.702 m), weight (!) 430 lb (195 kg), last menstrual period 04/16/1993, SpO2 98 %.  Assuming care from Dr. Reita Cliche of [facility] is a 75 y.o. female with a chief complaint of Flank Pain .    Refer to history of present illness for more details. Patient presented with left flank pain. Exam reassuring. Patient was pain-free upon arrival to the emergency department. Labs are within normal limits. CT renal protocol was done to rule out kidney stone and he was normal with no acute findings. Serial abdominal exams reassuring. Patient remains pain-free. UA with no evidence of urinary tract infection. We'll discharge home and close follow-up with primary care doctor.    Rudene Re, MD 10/08/15 2241

## 2015-10-08 NOTE — ED Notes (Addendum)
Pt c/o left flank pain that comes and goes - see triage note - pt is on doxycycline for UTI - states had home xray 3-4 days ago and they dx her with "mild colon ileus"

## 2015-10-08 NOTE — ED Notes (Signed)
IV attempted x1 by this nurse and x1 by Terrence Dupont RN without success Elenore Rota RN to attempt

## 2015-10-08 NOTE — ED Notes (Signed)
EMS called for transfer home

## 2015-10-08 NOTE — ED Provider Notes (Signed)
[facility] Emergency Department Provider Note ____________________________________________  Time seen: I have reviewed the triage vital signs and the triage nursing note.  HISTORY  Chief Complaint Flank Pain   Historian Patient  HPI [name] is a 75 y.o. female who is here for evaluation of left flank pain. She states that it comes and goes and is located along her waistline either flank and just around the back. It's worse with movement and at times it is just there. No radiation into the abdomen or down the leg. No numbness or weakness. No ladder symptoms or dysuria or incontinence. No black or bloody stools. No fevers. No epigastric discomfort, nausea or vomiting. No chest pain or trouble breathing.Pain is almost resolved at this point time, as the pain waxes and wanes. Symptoms sound like they started maybe about a week ago.    Past Medical History:  Diagnosis Date  . Acute dermatitis   . Asthma   . COPD (chronic obstructive pulmonary disease) ([place])   . Depression   . GERD (gastroesophageal reflux disease)   . Hyperlipidemia   . Hypertension   . Hypothyroidism   . Incontinence   . Morbid obesity ([place])   . Osteoarthritis   . Osteoarthritis   . PMB (postmenopausal bleeding)   . Sleep apnea   . Vaginal atrophy   . Vaginal Pap smear, abnormal     Patient Active Problem List   Diagnosis Date Noted  . Pressure ulcer 03/24/2015  . Cellulitis 03/23/2015  . Vaginal atrophy 08/19/2014  . Asthma, chronic 04/18/2012  . Obstructive sleep apnea 04/18/2012  . Essential hypertension, benign 04/18/2012  . Hypercholesterolemia 04/18/2012  . Aortic stenosis 04/18/2012  . Endometrial cancer ([place]) 04/18/2012  . Diverticulosis of colon without hemorrhage 04/18/2012  . Unspecified hypothyroidism 04/18/2012  . Overactive bladder 04/18/2012    Past Surgical History:  Procedure Laterality Date  . ABDOMINAL HYSTERECTOMY  2014   [facility]  .  CHOLECYSTECTOMY    . DILATION AND CURETTAGE OF UTERUS    . UMBILICAL HERNIA REPAIR      Prior to Admission medications   Medication Sig Start Date End Date Taking? Authorizing Provider  acetaminophen (TYLENOL) 500 MG tablet Take 500 mg by mouth every 6 (six) hours as needed.   Yes Historical Provider, MD  albuterol (PROVENTIL HFA;VENTOLIN HFA) 108 (90 BASE) MCG/ACT inhaler Inhale 2 puffs into the lungs every 6 (six) hours as needed for wheezing or shortness of breath.    Yes Historical Provider, MD  aspirin 81 MG chewable tablet Chew 81 mg by mouth daily.   Yes Historical Provider, MD  atorvastatin (LIPITOR) 20 MG tablet Take 20 mg by mouth at bedtime.    Yes Historical Provider, MD  doxycycline (VIBRAMYCIN) 100 MG capsule Take 100 mg by mouth 2 (two) times daily. 09/30/15  Yes Historical Provider, MD  furosemide (LASIX) 20 MG tablet Take 1 tablet (20 mg total) by mouth daily. 03/26/15  Yes Nicholes Mango, MD  hydrocortisone cream 1 % Apply 1 application topically daily.   Yes Historical Provider, MD  levothyroxine (SYNTHROID, LEVOTHROID) 125 MCG tablet Take 125 mcg by mouth daily before breakfast.   Yes Historical Provider, MD  Misc Natural Products (OSTEO BI-FLEX ADV TRIPLE ST) TABS Take 1 tablet by mouth daily.   Yes Historical Provider, MD  nystatin (MYCOSTATIN/NYSTOP) powder Apply 1 g topically daily as needed.   Yes Historical Provider, MD  potassium chloride SA (K-DUR,KLOR-CON) 20 MEQ tablet Take 1 tablet (20 mEq  total) by mouth daily. 09/23/12  Yes Einar Pheasant, MD  Turmeric 500 MG CAPS Take 500 mg by mouth daily.   Yes Historical Provider, MD  Calcium Carbonate-Vitamin D (CALCIUM 600+D) 600-400 MG-UNIT tablet Take 1 tablet by mouth daily after lunch.     Historical Provider, MD  Polyvinyl Alcohol-Povidone (REFRESH OP) Apply 1 drop to eye as needed (for dry eyes).    Historical Provider, MD    Allergies  Allergen Reactions  . Vicodin [Hydrocodone-Acetaminophen] Other (See Comments)     Reaction:  Raises pts BP     Family History  Problem Relation Age of Onset  . Alzheimer's disease Mother   . Leukemia Mother   . Brain cancer Brother   . Heart disease Neg Hx   . Diabetes Neg Hx   . Breast cancer Neg Hx   . Ovarian cancer Neg Hx     Social History Social History  Substance Use Topics  . Smoking status: Former Research scientist (life sciences)  . Smokeless tobacco: Never Used  . Alcohol use No    Review of Systems  Constitutional: Negative for fever. Eyes: Negative for visual changes. ENT: Negative for sore throat. Cardiovascular: Negative for chest pain. Respiratory: Negative for shortness of breath. Gastrointestinal: Negative for abdominal pain, vomiting and diarrhea. Genitourinary: Negative for dysuria. Musculoskeletal: Negative for extremity or joint pains. Skin: Negative for rash. Neurological: Negative for headache. 10 point Review of Systems otherwise negative ____________________________________________   PHYSICAL EXAM:  VITAL SIGNS: ED Triage Vitals  Enc Vitals Group     BP 10/08/15 1518 (!) 124/52     Pulse Rate 10/08/15 1518 83     Resp 10/08/15 1518 16     Temp 10/08/15 1518 97.6 F (36.4 C)     Temp Source 10/08/15 1518 Oral     SpO2 10/08/15 1518 97 %     Weight 10/08/15 1518 (!) 430 lb (195 kg)     Height 10/08/15 1518 5\' 7"  (1.702 m)     Head Circumference --      Peak Flow --      Pain Score 10/08/15 1519 0     Pain Loc --      Pain Edu? --      Excl. in [place]? --      Constitutional: Alert and oriented. Well appearing and in no distress. HEENT   Head: Normocephalic and atraumatic.      Eyes: Conjunctivae are normal. PERRL. Normal extraocular movements.      Ears:         Nose: No congestion/rhinnorhea.   Mouth/Throat: Mucous membranes are moist.   Neck: No stridor. Cardiovascular/Chest: Normal rate, regular rhythm.  No murmurs, rubs, or gallops. Respiratory: Normal respiratory effort without tachypnea nor retractions. Breath sounds  are clear and equal bilaterally. No wheezes/rales/rhonchi. Gastrointestinal: Soft. No distention, no guarding, no rebound.   Morbidly obese but nontender diffusely.  Flanks skin is normal without evidence of any skin rash. No CVA tenderness. No midline tenderness to the back. Genitourinary/rectal:Deferred Musculoskeletal: Nontender with normal range of motion in all extremities. No joint effusions.  No lower extremity tenderness.  No edema. Neurologic:  Normal speech and language. No gross or focal neurologic deficits are appreciated. Skin:  Skin is warm, dry and intact. No rash noted. Psychiatric: Mood and affect are normal. Speech and behavior are normal. Patient exhibits appropriate insight and judgment.  ____________________________________________   EKG I, Lisa Roca, MD, the attending physician have personally viewed and interpreted all ECGs.  [place]  bpm. Artifact but appears to be normal sinus rhythm. Narrow QRS. Normal axis. Normal ST and T-wave ____________________________________________  LABS (pertinent positives/negatives)  Labs Reviewed  COMPREHENSIVE METABOLIC PANEL - Abnormal; Notable for the following:       Result Value   Glucose, Bld 133 (*)    GFR calc non Af Amer 58 (*)    All other components within normal limits  LACTIC ACID, PLASMA - Abnormal; Notable for the following:    Lactic Acid, Venous 2.1 (*)    All other components within normal limits  URINALYSIS COMPLETEWITH MICROSCOPIC ([facility] ONLY) - Abnormal; Notable for the following:    Color, Urine COLORLESS (*)    APPearance CLEAR (*)    Specific Gravity, Urine 1.003 (*)    All other components within normal limits  URINE CULTURE  LIPASE, BLOOD  TROPONIN I  LACTIC ACID, PLASMA  CBC WITH DIFFERENTIAL/PLATELET    ____________________________________________  RADIOLOGY All Xrays were viewed by me. Imaging interpreted by Radiologist.  Renal ultrasound:IMPRESSION: Negative sonographic appearance of the  kidneys. Urinary bladder largely decompressed by Foley catheter. __________________________________________  PROCEDURES  Procedure(s) performed: None  Critical Care performed: None  ____________________________________________   ED COURSE / ASSESSMENT AND PLAN  Pertinent labs & imaging results that were available during my care of the patient were reviewed by me and considered in my medical decision making (see chart for details).  This patient came in for left flank pain waxing and waning by really persistent since yesterday and throughout the day. But now it is essentially resolved. She does not have any evidence of skin rash or lesion. She's not reporting any abdominal symptoms. She's not even really describing dysuria. She is not really describing any trauma or overuse injury. She is not describing numbness or tingling.  Laboratory studies are reassuring, with normal white blood cell count, urine is normal except for 0-5 red blood cells which may have been due to the catheterization itself, however the son the possibility of flank pain due to ureterolithiasis, I did discuss with the patient obtaining an ultrasound.  Renal ultrasound is normal.  I spoke with patient, I'm somewhat suspicious that this could be musculoskeletal, although the acute resolution may speak more to this actually having been due to ureterolithiasis.  I will do a CT stone study for diagnostic purposes.  Patient care transferred to Dr.Veronese, CT scan pending. If without changes that will require disposition change, patient could be discharged with my prepared instructions.    CONSULTATIONS:   None  Patient / Family / Caregiver informed of clinical course, medical decision-making process, and agree with plan.   I discussed return precautions, follow-up instructions, and discharged instructions with patient and/or family.   ___________________________________________   FINAL CLINICAL IMPRESSION(S) / ED  DIAGNOSES   Final diagnoses:  Left flank pain              Note: This dictation was prepared with Dragon dictation. Any transcriptional errors that result from this process are unintentional    Lisa Roca, MD 10/08/15 2050

## 2015-10-10 LAB — URINE CULTURE: Culture: NO GROWTH

## 2016-05-26 ENCOUNTER — Emergency Department: Payer: Medicare HMO

## 2016-05-26 ENCOUNTER — Inpatient Hospital Stay
Admission: EM | Admit: 2016-05-26 | Discharge: 2016-05-30 | DRG: 194 | Disposition: A | Discharge status: Skilled Nursing Facility | Payer: Medicare HMO | Attending: Internal Medicine | Admitting: Internal Medicine

## 2016-05-26 DIAGNOSIS — J90 Pleural effusion, not elsewhere classified: Secondary | ICD-10-CM

## 2016-05-26 DIAGNOSIS — Z90710 Acquired absence of both cervix and uterus: Secondary | ICD-10-CM | POA: Diagnosis not present

## 2016-05-26 DIAGNOSIS — L8990 Pressure ulcer of unspecified site, unspecified stage: Secondary | ICD-10-CM | POA: Insufficient documentation

## 2016-05-26 DIAGNOSIS — J189 Pneumonia, unspecified organism: Principal | ICD-10-CM | POA: Diagnosis present

## 2016-05-26 DIAGNOSIS — I10 Essential (primary) hypertension: Secondary | ICD-10-CM | POA: Diagnosis present

## 2016-05-26 DIAGNOSIS — E039 Hypothyroidism, unspecified: Secondary | ICD-10-CM | POA: Diagnosis present

## 2016-05-26 DIAGNOSIS — B965 Pseudomonas (aeruginosa) (mallei) (pseudomallei) as the cause of diseases classified elsewhere: Secondary | ICD-10-CM | POA: Diagnosis present

## 2016-05-26 DIAGNOSIS — Z87891 Personal history of nicotine dependence: Secondary | ICD-10-CM

## 2016-05-26 DIAGNOSIS — Z8589 Personal history of malignant neoplasm of other organs and systems: Secondary | ICD-10-CM | POA: Diagnosis not present

## 2016-05-26 DIAGNOSIS — R7881 Bacteremia: Secondary | ICD-10-CM | POA: Diagnosis present

## 2016-05-26 DIAGNOSIS — F329 Major depressive disorder, single episode, unspecified: Secondary | ICD-10-CM | POA: Diagnosis present

## 2016-05-26 DIAGNOSIS — G252 Other specified forms of tremor: Secondary | ICD-10-CM

## 2016-05-26 DIAGNOSIS — Z6841 Body Mass Index (BMI) 40.0 and over, adult: Secondary | ICD-10-CM | POA: Diagnosis not present

## 2016-05-26 DIAGNOSIS — L03116 Cellulitis of left lower limb: Secondary | ICD-10-CM | POA: Diagnosis present

## 2016-05-26 DIAGNOSIS — R600 Localized edema: Secondary | ICD-10-CM | POA: Diagnosis present

## 2016-05-26 DIAGNOSIS — E7800 Pure hypercholesterolemia, unspecified: Secondary | ICD-10-CM | POA: Diagnosis present

## 2016-05-26 DIAGNOSIS — Z79899 Other long term (current) drug therapy: Secondary | ICD-10-CM | POA: Diagnosis not present

## 2016-05-26 DIAGNOSIS — R32 Unspecified urinary incontinence: Secondary | ICD-10-CM | POA: Diagnosis present

## 2016-05-26 DIAGNOSIS — Z808 Family history of malignant neoplasm of other organs or systems: Secondary | ICD-10-CM

## 2016-05-26 DIAGNOSIS — R251 Tremor, unspecified: Secondary | ICD-10-CM | POA: Diagnosis present

## 2016-05-26 DIAGNOSIS — Z713 Dietary counseling and surveillance: Secondary | ICD-10-CM

## 2016-05-26 DIAGNOSIS — K219 Gastro-esophageal reflux disease without esophagitis: Secondary | ICD-10-CM | POA: Diagnosis present

## 2016-05-26 DIAGNOSIS — G4733 Obstructive sleep apnea (adult) (pediatric): Secondary | ICD-10-CM | POA: Diagnosis present

## 2016-05-26 DIAGNOSIS — I350 Nonrheumatic aortic (valve) stenosis: Secondary | ICD-10-CM | POA: Diagnosis present

## 2016-05-26 DIAGNOSIS — R0602 Shortness of breath: Secondary | ICD-10-CM

## 2016-05-26 DIAGNOSIS — Z7982 Long term (current) use of aspirin: Secondary | ICD-10-CM

## 2016-05-26 DIAGNOSIS — J440 Chronic obstructive pulmonary disease with acute lower respiratory infection: Secondary | ICD-10-CM | POA: Diagnosis present

## 2016-05-26 DIAGNOSIS — Z885 Allergy status to narcotic agent status: Secondary | ICD-10-CM

## 2016-05-26 DIAGNOSIS — E785 Hyperlipidemia, unspecified: Secondary | ICD-10-CM | POA: Diagnosis present

## 2016-05-26 DIAGNOSIS — Z9049 Acquired absence of other specified parts of digestive tract: Secondary | ICD-10-CM | POA: Diagnosis not present

## 2016-05-26 DIAGNOSIS — E871 Hypo-osmolality and hyponatremia: Secondary | ICD-10-CM | POA: Diagnosis present

## 2016-05-26 DIAGNOSIS — R259 Unspecified abnormal involuntary movements: Secondary | ICD-10-CM

## 2016-05-26 DIAGNOSIS — Z806 Family history of leukemia: Secondary | ICD-10-CM

## 2016-05-26 LAB — CBC WITH DIFFERENTIAL/PLATELET
Basophils Absolute: 0.1 10*3/uL (ref 0–0.1)
Basophils Relative: 0 %
Eosinophils Absolute: 0 10*3/uL (ref 0–0.7)
Eosinophils Relative: 0 %
HCT: 43.1 % (ref 35.0–47.0)
Hemoglobin: 15.1 g/dL (ref 12.0–16.0)
Lymphocytes Relative: 2 %
Lymphs Abs: 0.6 10*3/uL — ABNORMAL LOW (ref 1.0–3.6)
MCH: 32.1 pg (ref 26.0–34.0)
MCHC: 35 g/dL (ref 32.0–36.0)
MCV: 91.8 fL (ref 80.0–100.0)
Monocytes Absolute: 0.9 10*3/uL (ref 0.2–0.9)
Monocytes Relative: 4 %
Neutro Abs: 23.2 10*3/uL — ABNORMAL HIGH (ref 1.4–6.5)
Neutrophils Relative %: 94 %
Platelets: 239 10*3/uL (ref 150–440)
RBC: 4.69 MIL/uL (ref 3.80–5.20)
RDW: 13.2 % (ref 11.5–14.5)
WBC: 24.8 10*3/uL — ABNORMAL HIGH (ref 3.6–11.0)

## 2016-05-26 LAB — BASIC METABOLIC PANEL
Anion gap: 9 (ref 5–15)
BUN: 16 mg/dL (ref 6–20)
CO2: 24 mmol/L (ref 22–32)
Calcium: 9.5 mg/dL (ref 8.9–10.3)
Chloride: 100 mmol/L — ABNORMAL LOW (ref 101–111)
Creatinine, Ser: 0.85 mg/dL (ref 0.44–1.00)
GFR calc Af Amer: >60 mL/min (ref >60)
GFR calc non Af Amer: >60 mL/min (ref >60)
Glucose, Bld: 147 mg/dL — ABNORMAL HIGH (ref 65–99)
Potassium: 4.2 mmol/L (ref 3.5–5.1)
Sodium: 133 mmol/L — ABNORMAL LOW (ref 135–145)

## 2016-05-26 LAB — URINALYSIS, COMPLETE (UACMP) WITH MICROSCOPIC
Bilirubin Urine: NEGATIVE
Glucose, UA: NEGATIVE mg/dL
Ketones, ur: NEGATIVE mg/dL
Leukocytes, UA: NEGATIVE
Nitrite: NEGATIVE
Protein, ur: NEGATIVE mg/dL
Specific Gravity, Urine: 1.008 (ref 1.005–1.030)
Squamous Epithelial / LPF: NONE SEEN
pH: 6 (ref 5.0–8.0)

## 2016-05-26 LAB — TROPONIN I: Troponin I: <0.03 ng/mL (ref <0.03)

## 2016-05-26 MED ORDER — FUROSEMIDE 20 MG PO TABS
20.0000 mg | ORAL_TABLET | Freq: Every day | ORAL | Status: DC
  Administered 2016-05-27 – 2016-05-30 (×4): 20 mg via ORAL
  Filled 2016-05-26 (×4): qty 1

## 2016-05-26 MED ORDER — DEXTROSE 5 % IV SOLN: 1.0000 g | Freq: Once | INTRAVENOUS | Status: DC

## 2016-05-26 MED ORDER — DOCUSATE SODIUM 100 MG PO CAPS
100.0000 mg | ORAL_CAPSULE | Freq: Two times a day (BID) | ORAL | Status: DC | PRN
  Administered 2016-05-29: 100 mg via ORAL
  Filled 2016-05-26: qty 1

## 2016-05-26 MED ORDER — OSTEO BI-FLEX ADV TRIPLE ST PO TABS: 1.0000 | ORAL_TABLET | Freq: Every day | ORAL | Status: DC

## 2016-05-26 MED ORDER — ASPIRIN 81 MG PO CHEW
81.0000 mg | CHEWABLE_TABLET | Freq: Every day | ORAL | Status: DC
Start: 2016-05-27 — End: 2016-05-30
  Administered 2016-05-27 – 2016-05-30 (×4): 81 mg via ORAL
  Filled 2016-05-26 (×4): qty 1

## 2016-05-26 MED ORDER — CEFTRIAXONE SODIUM-DEXTROSE 1-3.74 GM-% IV SOLR
1.0000 g | Freq: Once | INTRAVENOUS | Status: AC
  Administered 2016-05-26: 1 g via INTRAVENOUS
  Filled 2016-05-26: qty 50

## 2016-05-26 MED ORDER — NYSTATIN 100000 UNIT/GM EX POWD
1.0000 g | Freq: Two times a day (BID) | CUTANEOUS | Status: DC | PRN
  Filled 2016-05-26: qty 15

## 2016-05-26 MED ORDER — POTASSIUM CHLORIDE CRYS ER 20 MEQ PO TBCR
20.0000 meq | EXTENDED_RELEASE_TABLET | Freq: Every day | ORAL | Status: DC
  Administered 2016-05-27 – 2016-05-30 (×4): 20 meq via ORAL
  Filled 2016-05-26 (×4): qty 1

## 2016-05-26 MED ORDER — POLYVINYL ALCOHOL 1.4 % OP SOLN
1.0000 [drp] | Freq: Two times a day (BID) | OPHTHALMIC | Status: DC
  Administered 2016-05-26 – 2016-05-27 (×3): 1 [drp] via OPHTHALMIC
  Filled 2016-05-26 (×2): qty 15

## 2016-05-26 MED ORDER — DEXTROSE 5 % IV SOLN
250.0000 mg | INTRAVENOUS | Status: DC
  Filled 2016-05-26: qty 250

## 2016-05-26 MED ORDER — CALCIUM CARBONATE-VITAMIN D 500-200 MG-UNIT PO TABS
1.0000 | ORAL_TABLET | Freq: Every day | ORAL | Status: DC
  Administered 2016-05-27 – 2016-05-30 (×4): 1 via ORAL
  Filled 2016-05-26 (×4): qty 1

## 2016-05-26 MED ORDER — ALBUTEROL SULFATE (2.5 MG/3ML) 0.083% IN NEBU: 2.5000 mg | INHALATION_SOLUTION | Freq: Four times a day (QID) | RESPIRATORY_TRACT | Status: DC | PRN

## 2016-05-26 MED ORDER — IPRATROPIUM-ALBUTEROL 0.5-2.5 (3) MG/3ML IN SOLN
3.0000 mL | Freq: Once | RESPIRATORY_TRACT | Status: AC
  Administered 2016-05-26: 3 mL via RESPIRATORY_TRACT
  Filled 2016-05-26: qty 3

## 2016-05-26 MED ORDER — CEFTRIAXONE SODIUM 1 G IJ SOLR
1.0000 g | INTRAMUSCULAR | Status: DC
  Filled 2016-05-26: qty 10

## 2016-05-26 MED ORDER — LEVOTHYROXINE SODIUM 25 MCG PO TABS
125.0000 ug | ORAL_TABLET | Freq: Every day | ORAL | Status: DC
  Administered 2016-05-27 – 2016-05-30 (×4): 125 ug via ORAL
  Filled 2016-05-26 (×5): qty 1

## 2016-05-26 MED ORDER — HYDROCORTISONE 1 % EX CREA
1.0000 "application " | TOPICAL_CREAM | Freq: Every day | CUTANEOUS | Status: DC | PRN
  Filled 2016-05-26: qty 28

## 2016-05-26 MED ORDER — ACETAMINOPHEN 500 MG PO TABS: 500.0000 mg | ORAL_TABLET | Freq: Four times a day (QID) | ORAL | Status: DC | PRN

## 2016-05-26 MED ORDER — HYDROMORPHONE HCL 1 MG/ML IJ SOLN
1.0000 mg | Freq: Once | INTRAMUSCULAR | Status: AC
  Administered 2016-05-26: 1 mg via INTRAVENOUS
  Filled 2016-05-26: qty 1

## 2016-05-26 MED ORDER — DEXTROSE 5 % IV SOLN
500.0000 mg | Freq: Once | INTRAVENOUS | Status: AC
  Administered 2016-05-26: 500 mg via INTRAVENOUS
  Filled 2016-05-26: qty 500

## 2016-05-26 MED ORDER — HEPARIN SODIUM (PORCINE) 5000 UNIT/ML IJ SOLN
5000.0000 [IU] | Freq: Three times a day (TID) | INTRAMUSCULAR | Status: DC
  Administered 2016-05-26 – 2016-05-29 (×10): 5000 [IU] via SUBCUTANEOUS
  Filled 2016-05-26 (×10): qty 1

## 2016-05-26 MED ORDER — ATORVASTATIN CALCIUM 20 MG PO TABS
20.0000 mg | ORAL_TABLET | Freq: Every day | ORAL | Status: DC
  Administered 2016-05-26 – 2016-05-29 (×4): 20 mg via ORAL
  Filled 2016-05-26 (×4): qty 1

## 2016-05-26 NOTE — Plan of Care (Signed)
Problem: Respiratory: Goal: Respiratory status will improve Outcome: Progressing Patient not experiencing any SOB or difficulty breathing. Patient O2 saturation 100% on room air.   Deri Fuelling, RN

## 2016-05-26 NOTE — Clinical Social Work Placement (Signed)
   CLINICAL SOCIAL WORK PLACEMENT  NOTE  Date:  05/26/2016  Patient Details  Name: Jamilee Lafosse [facility] MRN: [id number] Date of Birth: 06-Feb-1941  Clinical Social Work is seeking post-discharge placement for this patient at the [place] level of care (*CSW will initial, date and re-position this form in  chart as items are completed):  Yes   Patient/family provided with [facility] Work Department's list of facilities offering this level of care within the geographic area requested by the patient (or if unable, by the patient's family).  Yes   Patient/family informed of their freedom to choose among providers that offer the needed level of care, that participate in Medicare, Medicaid or managed care program needed by the patient, have an available bed and are willing to accept the patient.  Yes   Patient/family informed of [facility]'s ownership interest in [facility] and [facility], as well as of the fact that they are under no obligation to receive care at these facilities.  PASRR submitted to EDS on       PASRR number received on       Existing PASRR number confirmed on 05/26/16     FL2 transmitted to all facilities in geographic area requested by pt/family on 05/26/16     FL2 transmitted to all facilities within larger geographic area on       Patient informed that his/her managed care company has contracts with or will negotiate with certain facilities, including the following:            Patient/family informed of bed offers received.  Patient chooses bed at       Physician recommends and patient chooses bed at      Patient to be transferred to   on  .  Patient to be transferred to facility by       Patient family notified on   of transfer.  Name of family member notified:        PHYSICIAN       Additional Comment:    _______________________________________________ Keayra Graham, Veronia Beets, LCSW 05/26/2016, 5:16 PM

## 2016-05-26 NOTE — Progress Notes (Signed)
PT Cancellation Note  Patient Details Name: [name] [facility] MRN: [id number] DOB: 07-Jan-1941   Cancelled Treatment:    Reason Eval/Treat Not Completed: Patient declined, no reason specified Pt reports she wants to do something but states "Honey I'm so tired, can we wait until tomorrow?" Will try back at a later date.  Kreg Shropshire, DPT 05/26/2016, 4:27 PM

## 2016-05-26 NOTE — H&P (Signed)
[facility] NAME: [name]    MR#:  [id number]  DATE OF BIRTH:  07-06-1940  DATE OF ADMISSION:  05/26/2016  PRIMARY CARE PHYSICIAN: Velta Addison, Claretha Cooper, DO   REQUESTING/REFERRING PHYSICIAN: Reita Cliche  CHIEF COMPLAINT:   Chief Complaint  Patient presents with  . Tremors    HISTORY OF PRESENT ILLNESS: [name]  is a 76 y.o. female with a known history of COPD, depression, hyperlipidemia, hypertension, morbid obesity, sleep apnea, bilateral leg swelling- home at her baseline and requires some support to get out of her couch and get up and walk with her walker. She uses inhalers at her baseline for her COPD. For last few days she is feeling increasingly short of breath and some more weakness. She also have shaking on both her arms and she feels having some chills for last few days. Concerned with this she called EMS and brought to emergency room. Her urinalysis is negative and her skin exam up to her legs checked by me is negative, she is morbidly obese but as per the ER nurse and her assistant who had examined her rolled her over and checked her skin thoroughly denied having any cellulitis or ulcers. Of the chest she is noted to have some pleural effusion on the left side and elevated white blood cell counts with slightly low blood pressure in ER, concerned with this she is suspected to have pneumonia and given to admission to hospitalist team.  PAST MEDICAL HISTORY:   Past Medical History:  Diagnosis Date  . Acute dermatitis   . Asthma   . COPD (chronic obstructive pulmonary disease) ([place])   . Depression   . GERD (gastroesophageal reflux disease)   . Hyperlipidemia   . Hypertension   . Hypothyroidism   . Incontinence   . Morbid obesity ([place])   . Osteoarthritis   . Osteoarthritis   . PMB (postmenopausal bleeding)   . Sleep apnea   . Vaginal atrophy   . Vaginal Pap smear, abnormal     PAST SURGICAL HISTORY: Past Surgical History:   Procedure Laterality Date  . ABDOMINAL HYSTERECTOMY  2014   [facility]  . CHOLECYSTECTOMY    . DILATION AND CURETTAGE OF UTERUS    . UMBILICAL HERNIA REPAIR      SOCIAL HISTORY:  Social History  Substance Use Topics  . Smoking status: Former Research scientist (life sciences)  . Smokeless tobacco: Never Used  . Alcohol use No    FAMILY HISTORY:  Family History  Problem Relation Age of Onset  . Alzheimer's disease Mother   . Leukemia Mother   . Brain cancer Brother   . Heart disease Neg Hx   . Diabetes Neg Hx   . Breast cancer Neg Hx   . Ovarian cancer Neg Hx     DRUG ALLERGIES:  Allergies  Allergen Reactions  . Vicodin [Hydrocodone-Acetaminophen] Other (See Comments)    Reaction:  Raises pts BP     REVIEW OF SYSTEMS:   CONSTITUTIONAL: No fever, Positive for fatigue or weakness.  EYES: No blurred or double vision.  EARS, NOSE, AND THROAT: No tinnitus or ear pain.  RESPIRATORY: No cough, she have shortness of breath, wheezing , no hemoptysis.  CARDIOVASCULAR: No chest pain, orthopnea, edema.  GASTROINTESTINAL: No nausea, vomiting, diarrhea or abdominal pain.  GENITOURINARY: No dysuria, hematuria.  ENDOCRINE: No polyuria, nocturia,  HEMATOLOGY: No anemia, easy bruising or bleeding SKIN: No rash or lesion. MUSCULOSKELETAL: No joint pain or arthritis.  NEUROLOGIC: No tingling, numbness, weakness.  PSYCHIATRY: No anxiety or depression.   MEDICATIONS AT HOME:  Prior to Admission medications   Medication Sig Start Date End Date Taking? Authorizing Provider  albuterol (PROVENTIL HFA;VENTOLIN HFA) 108 (90 BASE) MCG/ACT inhaler Inhale 2 puffs into the lungs every 6 (six) hours as needed for wheezing or shortness of breath.    Yes Historical Provider, MD  Calcium Carbonate-Vitamin D (CALCIUM 600+D) 600-400 MG-UNIT tablet Take 1 tablet by mouth daily after lunch.    Yes Historical Provider, MD  acetaminophen (TYLENOL) 500 MG tablet Take 500 mg by mouth every 6 (six) hours as needed.    Historical  Provider, MD  aspirin 81 MG chewable tablet Chew 81 mg by mouth daily.    Historical Provider, MD  atorvastatin (LIPITOR) 20 MG tablet Take 20 mg by mouth at bedtime.     Historical Provider, MD  doxycycline (VIBRAMYCIN) 100 MG capsule Take 100 mg by mouth 2 (two) times daily. 09/30/15   Historical Provider, MD  furosemide (LASIX) 20 MG tablet Take 1 tablet (20 mg total) by mouth daily. 03/26/15   Nicholes Mango, MD  hydrocortisone cream 1 % Apply 1 application topically daily.    Historical Provider, MD  levothyroxine (SYNTHROID, LEVOTHROID) 125 MCG tablet Take 125 mcg by mouth daily before breakfast.    Historical Provider, MD  Misc Natural Products (OSTEO BI-FLEX ADV TRIPLE ST) TABS Take 1 tablet by mouth daily.    Historical Provider, MD  nystatin (MYCOSTATIN/NYSTOP) powder Apply 1 g topically daily as needed.    Historical Provider, MD  Polyvinyl Alcohol-Povidone (REFRESH OP) Apply 1 drop to eye as needed (for dry eyes).    Historical Provider, MD  potassium chloride SA (K-DUR,KLOR-CON) 20 MEQ tablet Take 1 tablet (20 mEq total) by mouth daily. 09/23/12   Einar Pheasant, MD  Turmeric 500 MG CAPS Take 500 mg by mouth daily.    Historical Provider, MD      PHYSICAL EXAMINATION:   VITAL SIGNS: Blood pressure (!) 145/86, pulse 98, temperature 99.8 F (37.7 C), temperature source Oral, resp. rate 16, weight (!) 195 kg (430 lb), last menstrual period 04/16/1993, SpO2 96 %.  GENERAL:  76 y.o.-year-old obese patient lying in the bed with no acute distress.  EYES: Pupils equal, round, reactive to light and accommodation. No scleral icterus. Extraocular muscles intact.  HEENT: Head atraumatic, normocephalic. Oropharynx and nasopharynx clear.  NECK:  Supple, no jugular venous distention. No thyroid enlargement, no tenderness.  LUNGS: Normal breath sounds bilaterally, some wheezing, no rales,rhonchi or crepitation. No use of accessory muscles of respiration.  CARDIOVASCULAR: S1, S2 normal. No murmurs,  rubs, or gallops.  ABDOMEN: Soft, nontender, nondistended. Bowel sounds present. No organomegaly or mass.  EXTREMITIES: Bilateral legs severe chronic appearing lymphatic swelling, no redness or signs of cellulitis, cyanosis, or clubbing.  NEUROLOGIC: Cranial nerves II through XII are intact. Muscle strength 5/5 in all extremities. Sensation intact. Gait not checked.  PSYCHIATRIC: The patient is alert and oriented x 3.  SKIN: No obvious rash, lesion, or ulcer. As per the ER nurse she and her assistant had rolled the patient or to check her lower back and her groin and genital areas and they could not see any ulcers or cellulitis.  LABORATORY PANEL:   CBC  Recent Labs Lab 05/26/16 1145  WBC 24.8*  HGB 15.1  HCT 43.1  PLT 239  MCV 91.8  MCH 32.1  MCHC 35.0  RDW 13.2  LYMPHSABS 0.6*  MONOABS 0.9  EOSABS 0.0  BASOSABS 0.1   ------------------------------------------------------------------------------------------------------------------  Chemistries   Recent Labs Lab 05/26/16 1145  NA 133*  K 4.2  CL 100*  CO2 24  GLUCOSE 147*  BUN 16  CREATININE 0.85  CALCIUM 9.5   ------------------------------------------------------------------------------------------------------------------ CrCl cannot be calculated (Unknown ideal weight.). ------------------------------------------------------------------------------------------------------------------ No results for input(s): TSH, T4TOTAL, T3FREE, THYROIDAB in the last 72 hours.  Invalid input(s): FREET3   Coagulation profile No results for input(s): INR, PROTIME in the last 168 hours. ------------------------------------------------------------------------------------------------------------------- No results for input(s): DDIMER in the last 72 hours. -------------------------------------------------------------------------------------------------------------------  Cardiac Enzymes  Recent Labs Lab 05/26/16 1145   TROPONINI <0.03   ------------------------------------------------------------------------------------------------------------------ Invalid input(s): POCBNP  ---------------------------------------------------------------------------------------------------------------  Urinalysis    Component Value Date/Time   COLORURINE STRAW (A) 05/26/2016 1144   APPEARANCEUR CLEAR (A) 05/26/2016 1144   APPEARANCEUR Clear 10/12/2013 0500   LABSPEC 1.008 05/26/2016 1144   LABSPEC 1.003 10/12/2013 0500   PHURINE 6.0 05/26/2016 1144   GLUCOSEU NEGATIVE 05/26/2016 1144   GLUCOSEU Negative 10/12/2013 0500   GLUCOSEU NEGATIVE 04/25/2012 0949   HGBUR SMALL (A) 05/26/2016 1144   BILIRUBINUR NEGATIVE 05/26/2016 1144   BILIRUBINUR Negative 10/12/2013 0500   KETONESUR NEGATIVE 05/26/2016 1144   PROTEINUR NEGATIVE 05/26/2016 1144   UROBILINOGEN 0.2 04/25/2012 0949   NITRITE NEGATIVE 05/26/2016 1144   LEUKOCYTESUR NEGATIVE 05/26/2016 1144   LEUKOCYTESUR 1+ 10/12/2013 0500     RADIOLOGY: Dg Chest Port 1 View  Result Date: 05/26/2016 CLINICAL DATA:  Wheezing, gradually worsening EXAM: PORTABLE CHEST 1 VIEW COMPARISON:  03/23/2015 FINDINGS: There is no focal consolidation. There is a trace left pleural effusion. There is no pneumothorax. The heart and mediastinal contours are unremarkable. The osseous structures are unremarkable. IMPRESSION: Trace left pleural effusion. Otherwise no active cardiopulmonary disease. Electronically Signed   By: Kathreen Devoid   On: 05/26/2016 12:05    EKG: Orders placed or performed during the hospital encounter of 05/26/16  . ED EKG  . ED EKG    IMPRESSION AND PLAN:  * Pneumonia, some pleural effusion.  IV ceftriaxone and azithromycin.   Blood culture is sent.   Repeat chest x-ray tomorrow.  * Generalized weakness and chills   Likely secondary to her infection, we'll get physical therapy evaluation to decide need for rehabilitation at the time of discharge.  *  Hypertension   Continue medicines.  * Hypothyroidism   Continue levothyroxine.  * Chronic leg edema   She is on furosemide, and advised to keep her legs elevated by using 3-4 pillows at nighttime.      All the records are reviewed and case discussed with ED provider. Management plans discussed with the patient, family and they are in agreement.  CODE STATUS:Limited code , she wants to have chest compressions, CPR, defibrillation, use of medications and noninvasive ventilation in any adverse event but does not want to be intubated or placed on the ventilatory machine, So she confirms with me.   She says she has living will papers signed and her son is her power of attorney and she will try to provide [place] a copy of those papers later.   Code Status History    Date Active Date Inactive Code Status Order ID Comments User Context   03/23/2015  8:44 PM 03/25/2015  9:44 PM Full Code [id number]  Lytle [place], MD ED     Patient also have called 911 about some kind of dispute with her husband who she complaining about having dementia and needed help, as a  result of that detective from [facility] office was also present in the room during my visit and interview with the patient and she agreed to discuss everything in presence of him.  TOTAL TIME TAKING CARE OF THIS PATIENT: 50 minutes.    Vaughan Basta M.D on 05/26/2016   Between 7am to 6pm - Pager - [phone]  After 6pm go to [email or web address] - password [email or web address] [place] [facility]  Office  [phone]  CC: Primary care physician; Velta Addison, Claretha Cooper, DO   Note: This dictation was prepared with Dragon dictation along with smaller phrase technology. Any transcriptional errors that result from this process are unintentional.
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
## 2016-05-26 NOTE — ED Notes (Signed)
Pt given ginger ale.

## 2016-05-26 NOTE — Progress Notes (Signed)
PHARMACIST - PHYSICIAN ORDER COMMUNICATION  CONCERNING: P&T Medication Policy on Herbal Medications  DESCRIPTION:  This patient's order for:  Osteo Bi Flex Adv Triple strength tablets  has been noted.  This product(s) is classified as an "herbal" or natural product. Due to a lack of definitive safety studies or FDA approval, nonstandard manufacturing practices, plus the potential risk of unknown drug-drug interactions while on inpatient medications, the Pharmacy and Therapeutics Committee does not permit the use of "herbal" or natural products of this type within [facility].   ACTION TAKEN: The pharmacy department is unable to verify this order at this time and your patient has been informed of this safety policy. Please reevaluate patient's clinical condition at discharge and address if the herbal or natural product(s) should be resumed at that time.

## 2016-05-26 NOTE — ED Provider Notes (Signed)
[facility] Emergency Department Provider Note ____________________________________________   I have reviewed the triage vital signs and the triage nursing note.  HISTORY  Chief Complaint Tremors   Historian Patient  HPI [name] is a 76 y.o. female came in by EMS, states that she called 911 because she was having tremors in both arms this morning. States that she hasn't really had tremors before. She states is morbidly obese and lives at home with her husband which she states has Alzheimer's dementia. She states that she is typically able to get up and walk short amounts, but totally assisted that within 10 or 15 minutes. She obtains her food from [employer] which are delivered on Monday.  Patient states that she does have chronic COPD, and asthma and states that her breathing is about typical for her, but she has currently wheezing.  No recent fevers, chest pain, vomiting, diarrhea. She does state that she is having some pain with urination. Because she is morbidly obese, she states that she typically puts towels beneath her and then urinates and the towels and then throws the towels away.    Past Medical History:  Diagnosis Date  . Acute dermatitis   . Asthma   . COPD (chronic obstructive pulmonary disease) ([place])   . Depression   . GERD (gastroesophageal reflux disease)   . Hyperlipidemia   . Hypertension   . Hypothyroidism   . Incontinence   . Morbid obesity ([place])   . Osteoarthritis   . Osteoarthritis   . PMB (postmenopausal bleeding)   . Sleep apnea   . Vaginal atrophy   . Vaginal Pap smear, abnormal     Patient Active Problem List   Diagnosis Date Noted  . Pressure ulcer 03/24/2015  . Cellulitis 03/23/2015  . Vaginal atrophy 08/19/2014  . Asthma, chronic 04/18/2012  . Obstructive sleep apnea 04/18/2012  . Essential hypertension, benign 04/18/2012  . Hypercholesterolemia 04/18/2012  . Aortic stenosis 04/18/2012  .  Endometrial cancer ([place]) 04/18/2012  . Diverticulosis of colon without hemorrhage 04/18/2012  . Unspecified hypothyroidism 04/18/2012  . Overactive bladder 04/18/2012    Past Surgical History:  Procedure Laterality Date  . ABDOMINAL HYSTERECTOMY  2014   [facility]  . CHOLECYSTECTOMY    . DILATION AND CURETTAGE OF UTERUS    . UMBILICAL HERNIA REPAIR      Prior to Admission medications   Medication Sig Start Date End Date Taking? Authorizing Provider  acetaminophen (TYLENOL) 500 MG tablet Take 500 mg by mouth every 6 (six) hours as needed.    Historical Provider, MD  albuterol (PROVENTIL HFA;VENTOLIN HFA) 108 (90 BASE) MCG/ACT inhaler Inhale 2 puffs into the lungs every 6 (six) hours as needed for wheezing or shortness of breath.     Historical Provider, MD  aspirin 81 MG chewable tablet Chew 81 mg by mouth daily.    Historical Provider, MD  atorvastatin (LIPITOR) 20 MG tablet Take 20 mg by mouth at bedtime.     Historical Provider, MD  Calcium Carbonate-Vitamin D (CALCIUM 600+D) 600-400 MG-UNIT tablet Take 1 tablet by mouth daily after lunch.     Historical Provider, MD  doxycycline (VIBRAMYCIN) 100 MG capsule Take 100 mg by mouth 2 (two) times daily. 09/30/15   Historical Provider, MD  furosemide (LASIX) 20 MG tablet Take 1 tablet (20 mg total) by mouth daily. 03/26/15   Nicholes Mango, MD  hydrocortisone cream 1 % Apply 1 application topically daily.    Historical Provider, MD  levothyroxine (SYNTHROID, LEVOTHROID) 125 MCG tablet Take 125 mcg by mouth daily before breakfast.    Historical Provider, MD  Misc Natural Products (OSTEO BI-FLEX ADV TRIPLE ST) TABS Take 1 tablet by mouth daily.    Historical Provider, MD  nystatin (MYCOSTATIN/NYSTOP) powder Apply 1 g topically daily as needed.    Historical Provider, MD  Polyvinyl Alcohol-Povidone (REFRESH OP) Apply 1 drop to eye as needed (for dry eyes).    Historical Provider, MD  potassium chloride SA (K-DUR,KLOR-CON) 20 MEQ tablet Take 1 tablet (20  mEq total) by mouth daily. 09/23/12   Einar Pheasant, MD  Turmeric 500 MG CAPS Take 500 mg by mouth daily.    Historical Provider, MD    Allergies  Allergen Reactions  . Vicodin [Hydrocodone-Acetaminophen] Other (See Comments)    Reaction:  Raises pts BP     Family History  Problem Relation Age of Onset  . Alzheimer's disease Mother   . Leukemia Mother   . Brain cancer Brother   . Heart disease Neg Hx   . Diabetes Neg Hx   . Breast cancer Neg Hx   . Ovarian cancer Neg Hx     Social History Social History  Substance Use Topics  . Smoking status: Former Research scientist (life sciences)  . Smokeless tobacco: Never Used  . Alcohol use No    Review of Systems  Constitutional: Negative for fever. Eyes: Negative for visual changes. ENT: Negative for sore throat. Cardiovascular: Negative for chest pain. Respiratory: Positive for chronic shortness of breath. Gastrointestinal: Negative for abdominal pain, vomiting and diarrhea. Genitourinary: Positive for dysuria. Musculoskeletal: Negative for back pain. Skin: Negative for rash. Neurological: Negative for headache. 10 point Review of Systems otherwise negative ____________________________________________   PHYSICAL EXAM:  VITAL SIGNS: ED Triage Vitals  Enc Vitals Group     BP 05/26/16 1135 (!) 145/86     Pulse Rate 05/26/16 1135 98     Resp 05/26/16 1135 16     Temp 05/26/16 1135 99.8 F (37.7 C)     Temp Source 05/26/16 1135 Oral     SpO2 05/26/16 1135 96 %     Weight 05/26/16 1136 (!) 430 lb (195 kg)     Height --      Head Circumference --      Peak Flow --      Pain Score --      Pain Loc --      Pain Edu? --      Excl. in [place]? --      Constitutional: Alert and oriented. Well appearing and in no distress. HEENT   Head: Normocephalic and atraumatic.      Eyes: Conjunctivae are normal. PERRL. Normal extraocular movements.      Ears:         Nose: No congestion/rhinnorhea.   Mouth/Throat: Mucous membranes are moist.    Neck: No stridor. Cardiovascular/Chest: Normal rate, regular rhythm.  No murmurs, rubs, or gallops. Respiratory: Normal respiratory effort without tachypnea nor retractions. Mild to moderate wheezing all fields. No rhonchi.  Gastrointestinal: Soft. No distention, no guarding, no rebound. Nontender. Morbidly obese. Genitourinary/rectal:Deferred Musculoskeletal: Nontender with normal range of motion in all extremities. No joint effusions.  No lower extremity tenderness.  No edema. Neurologic:  Normal speech and language. No gross or focal neurologic deficits are appreciated.  Occasional resting tremor of both hands/forearms. Skin:  Skin is warm, dry and intact. No rash noted. Psychiatric: Mood and affect are normal. Speech and behavior are normal. Patient exhibits  appropriate insight and judgment.   ____________________________________________  LABS (pertinent positives/negatives)  Labs Reviewed  URINALYSIS, COMPLETE (UACMP) WITH MICROSCOPIC - Abnormal; Notable for the following:       Result Value   Color, Urine STRAW (*)    APPearance CLEAR (*)    Hgb urine dipstick SMALL (*)    Bacteria, UA RARE (*)    All other components within normal limits  BASIC METABOLIC PANEL - Abnormal; Notable for the following:    Sodium 133 (*)    Chloride 100 (*)    Glucose, Bld 147 (*)    All other components within normal limits  CBC WITH DIFFERENTIAL/PLATELET - Abnormal; Notable for the following:    WBC 24.8 (*)    Neutro Abs 23.2 (*)    Lymphs Abs 0.6 (*)    All other components within normal limits  URINE CULTURE  CULTURE, BLOOD (ROUTINE X 2)  CULTURE, BLOOD (ROUTINE X 2)  TROPONIN I    ____________________________________________    EKG I, Lisa Roca, MD, the attending physician have personally viewed and interpreted all ECGs.  102 bpm. Sinus tachycardia. Narrow QRS. Normal axis. No certain evidence for ischemic findings in the ST and T-wave, however there is significant movement  artifact with underlying wavy baseline. ____________________________________________  RADIOLOGY All Xrays were viewed by me. Imaging interpreted by Radiologist.  Chest x-ray portable:  IMPRESSION: Trace left pleural effusion. Otherwise no active cardiopulmonary disease. __________________________________________  PROCEDURES  Procedure(s) performed: None  Critical Care performed: None  ____________________________________________   ED COURSE / ASSESSMENT AND PLAN  Pertinent labs & imaging results that were available during my care of the patient were reviewed by me and considered in my medical decision making (see chart for details).  Mr. Rosenow is here because a resting tremor which she's never noticed before. She's also short of breath, but she does have a history of underlying COPD. She is wheezing here without hypoxia and given DuoNeb treatment.  In terms of the resting tremor, patient is going to need potentially outpatient neurology follow-up.  She does not have any focal neurologic symptoms, not concerned about a central emergency cause at this point time.  Not recommending neuroimaging at this time.  On her laboratory evaluation, she was found to have a significant elevated white blood cell count 25. Given her shortness of breath, and an x-ray with a small left pleural effusion, I am going to cover her for possible left sided pneumonia with community acquired pneumonia antibiotics Rocephin and azithromycin.  Ua with bacteria but not obvious UTI, will send culture.  Sent blood cultures.  Given her comorbidities of underlying lung disease, tachycardia although intermittent here, subjective shortness of breath, and very elevated white blood cell count, I am concerned about her worsening in the short term. I think she needs observation/monitoring, and discussed with the hospitalist for admission.    CONSULTATIONS:   Hospitalist for admission.   Patient / Family /  Caregiver informed of clinical course, medical decision-making process, and agree with plan.   ___________________________________________   FINAL CLINICAL IMPRESSION(S) / ED DIAGNOSES   Final diagnoses:  Resting tremor  Shortness of breath  Pleural effusion on left  Community acquired pneumonia of left lung, unspecified part of lung              Note: This dictation was prepared with Diplomatic Services operational officer dictation. Any transcriptional errors that result from this process are unintentional    Lisa Roca, MD 05/26/16 1246

## 2016-05-26 NOTE — Progress Notes (Signed)
Family Meeting Note  Advance Directive:yes  Today a meeting took place with the Patient.   The following clinical team members were present during this meeting:MD  The following were discussed:Patient's diagnosis: morbid obesity, pneumonia , Patient's progosis: Unable to determine and Goals for treatment: Continue present management Discussed the goals of care- and resussitation with her, she does not want intubation or ventilation but will like to have CPR or defibrillation and medications if her heart stops.  Additional follow-up to be provided: PMD  Time spent during discussion:20 minutes  [name], Rosalio Macadamia, MD
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
## 2016-05-26 NOTE — NC FL2 (Signed)
[place] LEVEL OF CARE SCREENING TOOL     IDENTIFICATION  Patient Name: [name] [facility] Birthdate: 1940/12/14 Sex: female Admission Date (Current Location): 05/26/2016  [place] and [place] Number:  Engineering geologist and Address:  [facility], [address] [place], [place] [address]      Provider Number: [id number]  Attending Physician Name and Address:  Vaughan Basta, MD  Relative Name and Phone Number:       Current Level of Care: Hospital Recommended Level of Care: [place] Prior Approval Number:    Date Approved/Denied:   PASRR Number:  ([id number] )  Discharge Plan: SNF    Current Diagnoses: Patient Active Problem List   Diagnosis Date Noted  . Community acquired pneumonia 05/26/2016  . Pneumonia 05/26/2016  . Pressure ulcer 03/24/2015  . Cellulitis 03/23/2015  . Vaginal atrophy 08/19/2014  . Asthma, chronic 04/18/2012  . Obstructive sleep apnea 04/18/2012  . Essential hypertension, benign 04/18/2012  . Hypercholesterolemia 04/18/2012  . Aortic stenosis 04/18/2012  . Endometrial cancer ([place]) 04/18/2012  . Diverticulosis of colon without hemorrhage 04/18/2012  . Unspecified hypothyroidism 04/18/2012  . Overactive bladder 04/18/2012    Orientation RESPIRATION BLADDER Height & Weight     Self, Time, Situation, Place  Normal Incontinent Weight: (!) 430 lb 1.9 oz (195.1 kg) Height:  5\' 6"  (167.6 cm)  BEHAVIORAL SYMPTOMS/MOOD NEUROLOGICAL BOWEL NUTRITION STATUS   (none)  (none) Incontinent Diet (Diet: 2 gram sodium. )  AMBULATORY STATUS COMMUNICATION OF NEEDS Skin   Extensive Assist Verbally Normal                       Personal Care Assistance Level of Assistance  Bathing, Feeding, Dressing Bathing Assistance: Limited assistance Feeding assistance: Independent Dressing Assistance: Limited assistance     Functional Limitations Info  Sight, Hearing, Speech Sight Info:  Adequate Hearing Info: Adequate Speech Info: Adequate    SPECIAL CARE FACTORS FREQUENCY  PT (By licensed PT), OT (By licensed OT)     PT Frequency:  (5) OT Frequency:  (5)            Contractures      Additional Factors Info  Code Status, Allergies Code Status Info:  (partial code. ) Allergies Info:  (Vicodin Hydrocodone-acetaminophen)           Current Medications (05/26/2016):  This is the current hospital active medication list Current Facility-Administered Medications  Medication Dose Route Frequency Provider Last Rate Last Dose  . acetaminophen (TYLENOL) tablet 500 mg  500 mg Oral Q6H PRN Vaughan Basta, MD      . albuterol (PROVENTIL) (2.5 MG/3ML) 0.083% nebulizer solution 2.5 mg  2.5 mg Inhalation Q6H PRN Vaughan Basta, MD      . Derrill Memo ON 05/27/2016] aspirin chewable tablet 81 mg  81 mg Oral Daily Vaughan Basta, MD      . atorvastatin (LIPITOR) tablet 20 mg  20 mg Oral QHS Vaughan Basta, MD      . Derrill Memo ON 05/27/2016] azithromycin (ZITHROMAX) 250 mg in dextrose 5 % 125 mL IVPB  250 mg Intravenous Q24H Vaughan Basta, MD      . Derrill Memo ON 05/27/2016] calcium-vitamin D (OSCAL WITH D) 500-200 MG-UNIT per tablet 1 tablet  1 tablet Oral QPC lunch Vaughan Basta, MD      . Derrill Memo ON 05/27/2016] cefTRIAXone (ROCEPHIN) 1 g in dextrose 5 % 50 mL IVPB  1 g Intravenous Q24H Vaughan Basta, MD      .  docusate sodium (COLACE) capsule 100 mg  100 mg Oral BID PRN Vaughan Basta, MD      . Derrill Memo ON 05/27/2016] furosemide (LASIX) tablet 20 mg  20 mg Oral Daily Vaughan Basta, MD      . heparin injection 5,000 Units  5,000 Units Subcutaneous Q8H Vaughan Basta, MD   5,000 Units at 05/26/16 1659  . hydrocortisone cream 1 % 1 application  1 application Topical Daily PRN Vaughan Basta, MD      . Derrill Memo ON 05/27/2016] levothyroxine (SYNTHROID, LEVOTHROID) tablet 125 mcg  125 mcg Oral QAC breakfast Vaughan Basta, MD      . nystatin (MYCOSTATIN/NYSTOP) topical powder 1 g  1 g Topical BID PRN Vaughan Basta, MD      . polyvinyl alcohol (LIQUIFILM TEARS) 1.4 % ophthalmic solution 1 drop  1 drop Both Eyes BID Vaughan Basta, MD      . Derrill Memo ON 05/27/2016] potassium chloride SA (K-DUR,KLOR-CON) CR tablet 20 mEq  20 mEq Oral Daily Vaughan Basta, MD         Discharge Medications: Please see discharge summary for a list of discharge medications.  Relevant Imaging Results:  Relevant Lab Results:   Additional Information  (SSN: [id number])  Kerrin Markman, Veronia Beets, LCSW

## 2016-05-27 ENCOUNTER — Inpatient Hospital Stay: Payer: Medicare HMO

## 2016-05-27 LAB — BASIC METABOLIC PANEL
Anion gap: 10 (ref 5–15)
BUN: 20 mg/dL (ref 6–20)
CO2: 25 mmol/L (ref 22–32)
Calcium: 9 mg/dL (ref 8.9–10.3)
Chloride: 98 mmol/L — ABNORMAL LOW (ref 101–111)
Creatinine, Ser: 1.09 mg/dL — ABNORMAL HIGH (ref 0.44–1.00)
GFR calc Af Amer: 56 mL/min — ABNORMAL LOW (ref >60)
GFR calc non Af Amer: 48 mL/min — ABNORMAL LOW (ref >60)
Glucose, Bld: 119 mg/dL — ABNORMAL HIGH (ref 65–99)
Potassium: 3.7 mmol/L (ref 3.5–5.1)
Sodium: 133 mmol/L — ABNORMAL LOW (ref 135–145)

## 2016-05-27 LAB — CBC
HCT: 38.7 % (ref 35.0–47.0)
Hemoglobin: 13.4 g/dL (ref 12.0–16.0)
MCH: 31.3 pg (ref 26.0–34.0)
MCHC: 34.5 g/dL (ref 32.0–36.0)
MCV: 90.6 fL (ref 80.0–100.0)
Platelets: 212 10*3/uL (ref 150–440)
RBC: 4.27 MIL/uL (ref 3.80–5.20)
RDW: 13.7 % (ref 11.5–14.5)
WBC: 21.4 10*3/uL — ABNORMAL HIGH (ref 3.6–11.0)

## 2016-05-27 LAB — BLOOD CULTURE ID PANEL (REFLEXED)

## 2016-05-27 LAB — URINE CULTURE: Culture: NO GROWTH

## 2016-05-27 MED ORDER — DEXTROSE 5 % IV SOLN
2.0000 g | Freq: Three times a day (TID) | INTRAVENOUS | Status: DC
  Administered 2016-05-27 – 2016-05-30 (×10): 2 g via INTRAVENOUS
  Filled 2016-05-27 (×16): qty 2

## 2016-05-27 MED ORDER — SODIUM CHLORIDE 0.9 % IV SOLN
3.0000 g | Freq: Four times a day (QID) | INTRAVENOUS | Status: DC
  Administered 2016-05-27: 3 g via INTRAVENOUS
  Filled 2016-05-27 (×3): qty 3

## 2016-05-27 MED ORDER — KETOROLAC TROMETHAMINE 15 MG/ML IJ SOLN
15.0000 mg | Freq: Once | INTRAMUSCULAR | Status: AC
  Administered 2016-05-27: 15 mg via INTRAVENOUS
  Filled 2016-05-27: qty 1

## 2016-05-27 MED ORDER — HYDROMORPHONE HCL 1 MG/ML IJ SOLN
0.5000 mg | Freq: Once | INTRAMUSCULAR | Status: AC
  Administered 2016-05-27: 0.5 mg via INTRAVENOUS
  Filled 2016-05-27: qty 1

## 2016-05-27 NOTE — Progress Notes (Addendum)
[place] [facility] [place] NAME: [name]    MR#:  [id number]  DATE OF BIRTH:  [birth year]  SUBJECTIVE:   Patient presents with chills and weakness She is not complaining of shortness of breath or cough. She reports she has had some sinus issues over the past month.  REVIEW OF SYSTEMS:    Review of Systems  Constitutional: Negative.  Negative for chills, fever and malaise/fatigue.  HENT: Negative.  Negative for ear discharge, ear pain, hearing loss, nosebleeds and sore throat.   Eyes: Negative.  Negative for blurred vision and pain.  Respiratory: Negative.  Negative for cough, hemoptysis, shortness of breath and wheezing.   Cardiovascular: Positive for leg swelling. Negative for chest pain and palpitations.  Gastrointestinal: Negative.  Negative for abdominal pain, blood in stool, diarrhea, nausea and vomiting.  Genitourinary: Negative.  Negative for dysuria.  Musculoskeletal: Negative.  Negative for back pain.  Skin: Negative.   Neurological: Negative for dizziness, tremors, speech change, focal weakness, seizures and headaches.  Endo/Heme/Allergies: Negative.  Does not bruise/bleed easily.  Psychiatric/Behavioral: Negative.  Negative for depression, hallucinations and suicidal ideas.    Tolerating Diet:yes      DRUG ALLERGIES:   Allergies  Allergen Reactions  . Morphine And Related   . Vicodin [Hydrocodone-Acetaminophen] Other (See Comments)    Reaction:  Raises pts BP     VITALS:  Blood pressure (!) 121/34, pulse 83, temperature (!) 100.4 F (38 C), temperature source Oral, resp. rate 18, height 5\' 6"  (1.676 m), weight (!) 195.1 kg (430 lb 1.9 oz), last menstrual period 04/16/1993, SpO2 96 %.  PHYSICAL EXAMINATION:   Physical Exam  Constitutional: She is oriented to person, place, and time and well-developed, well-nourished, and in no distress. No distress.  HENT:  Head: Normocephalic.  Eyes: No scleral icterus.  Neck:  Normal range of motion. Neck supple. No JVD present. No tracheal deviation present.  Cardiovascular: Normal rate, regular rhythm and normal heart sounds.  Exam reveals no gallop and no friction rub.   No murmur heard. Pulmonary/Chest: Effort normal and breath sounds normal. No respiratory distress. She has no wheezes. She has no rales. She exhibits no tenderness.  Abdominal: Soft. Bowel sounds are normal. She exhibits no distension and no mass. There is no tenderness. There is no rebound and no guarding.  Musculoskeletal: Normal range of motion. She exhibits edema.  Neurological: She is alert and oriented to person, place, and time.  Skin: Skin is warm. No rash noted. No erythema.  Left ankle with erythema, warmth and tenderness extends around ankle  Psychiatric: Affect and judgment normal.      LABORATORY PANEL:   CBC  Recent Labs Lab 05/27/16 0306  WBC 21.4*  HGB 13.4  HCT 38.7  PLT 212   ------------------------------------------------------------------------------------------------------------------  Chemistries   Recent Labs Lab 05/27/16 0306  NA 133*  K 3.7  CL 98*  CO2 25  GLUCOSE 119*  BUN 20  CREATININE 1.09*  CALCIUM 9.0   ------------------------------------------------------------------------------------------------------------------  Cardiac Enzymes  Recent Labs Lab 05/26/16 1145  TROPONINI <0.03   ------------------------------------------------------------------------------------------------------------------  RADIOLOGY:  Dg Chest Port 1 View  Result Date: 05/26/2016 CLINICAL DATA:  Wheezing, gradually worsening EXAM: PORTABLE CHEST 1 VIEW COMPARISON:  03/23/2015 FINDINGS: There is no focal consolidation. There is a trace left pleural effusion. There is no pneumothorax. The heart and mediastinal contours are unremarkable. The osseous structures are unremarkable. IMPRESSION: Trace left pleural effusion. Otherwise no active cardiopulmonary disease.  Electronically  Signed   By: Kathreen Devoid   On: 05/26/2016 12:05     ASSESSMENT AND PLAN:   76 year old morbidly obese female with a history of morbid obesity and hypothyroidism who presents with generalized weakness  1. Left lower extremity cellulitis: I will change antibiotics from Rocephin and azithromycin to Unasyn to better cover for left lower extremely cellular trace. I have asked nurse to elevate leg Follow up on blood cultures. I am not convinced patient has pneumonia. Unasyn will cover for pneumonia as well 2. Generalized weakness: Follow-up PT consult   3. Hyperlipidemia: Continue statin  4. Hypothyroidism: Continue Synthroid     5. Morbid obesity: Weight loss is encouraged.  6. COPD without exacerbation: Continue duo nebs  7. Hyponatremia improved 8.Leukocytosis from cellulitis: Improving Continue antiemetics as planned above Management plans discussed with the patient and she is in agreement.  CODE STATUS: full  TOTAL TIME TAKING CARE OF THIS PATIENT: 30 minutes.     POSSIBLE D/C tomorrow, DEPENDING ON CLINICAL CONDITION.   [name] M.D on 05/27/2016 at 9:30 AM  Between 7am to 6pm - Pager - [phone] After 6pm go to [email or web address] - password [email or web address] [place] [facility]  Office  [phone]  CC: Primary care physician; Velta Addison, Claretha Cooper, DO  Note: This dictation was prepared with Dragon dictation along with smaller phrase technology. Any transcriptional errors that result from this process are unintentional.

## 2016-05-27 NOTE — Progress Notes (Signed)
Physical Therapy Evaluation Patient Details Name: [name] [facility] MRN: [id number] DOB: 12/21/1940 Today's Date: 05/27/2016   History of Present Illness  Patient is a pleasant 76 y.o. female admitted on 23 March after experiencing increasing SOB and tremors over past few weeks. Found to have pneumonia. PMH includes COPD, depression, HLD, HTN, morbid obesity, apnea, and B LE edema.   Clinical Impression  Patient is a pleasant female admitted for above listed reasons. Patient reportedly housebound for past two years, requiring assistance from husband who has Alzheimer's. Admittedly did not bathe for 6 weeks due to lack of assistance. Reports requiring assistance from husband into standing and being able to ambulate household distances with RW. Upon evaluation, patient reports that nursing did not want her standing/walking today, but she was willing to participate in bed mobility assessment. Patient required minimal assistance to roll and scoot to [facility]; however, she did require prolonged rest breaks. It is believed that she would require more assistance to move into sitting/standing which is not her baseline level of function. Patient will benefit from skilled and progressive PT to improve overall functional mobility and safely return home. She will likely require f/u at SNF upon discharge unless there is a change in mobility status during hospital stay.    Follow Up Recommendations SNF    Equipment Recommendations       Recommendations for Other Services       Precautions / Restrictions Precautions Precautions: Fall Restrictions Weight Bearing Restrictions: No      Mobility  Bed Mobility Overal bed mobility: Needs Assistance Bed Mobility: Rolling Rolling: Min assist         General bed mobility comments: Patient able to roll R/L with minimal assistance utilizing bed rails. Scoot to [facility] with bed rails and minimal assistance.  Transfers                 General transfer  comment: Deferred; patient reported nursing did not want her standing/walking today  Ambulation/Gait                Stairs            Wheelchair Mobility    Modified Rankin (Stroke Patients Only)       Balance                                             Pertinent Vitals/Pain Pain Assessment: No/denies pain    Home Living Family/patient expects to be discharged to:: Private residence Living Arrangements: Spouse/significant other Available Help at Discharge: Family;Available 24 hours/day;Other (Comment) (Spouse has Alzheimer's) Type of Home: House Home Access: Level entry     Home Layout: One level Home Equipment: Walker - 2 wheels;Bedside commode;Grab bars - tub/shower;Grab bars - toilet      Prior Function Level of Independence: Needs assistance   Gait / Transfers Assistance Needed: Husband assisted with sit to stand. Patient performed household ambulation with RW.  ADL's / Homemaking Assistance Needed: Groceries purchased by son every 3 weeks; patient receives meals on wheels. Has reportedly been housebound for 2 years. Admits to not bathing for 6 weeks because husband refused to assist.        Hand Dominance        Extremity/Trunk Assessment   Upper Extremity Assessment Upper Extremity Assessment: Generalized weakness    Lower Extremity Assessment Lower Extremity Assessment: Generalized weakness  Communication   Communication: No difficulties  Cognition Arousal/Alertness: Awake/alert Behavior During Therapy: WFL for tasks assessed/performed Overall Cognitive Status: Within Functional Limits for tasks assessed                                        General Comments      Exercises     Assessment/Plan    PT Assessment Patient needs continued PT services  PT Problem List Decreased strength;Decreased activity tolerance;Decreased balance;Decreased mobility;Cardiopulmonary status limiting  activity;Obesity;Decreased skin integrity;Decreased range of motion       PT Treatment Interventions Gait training;Functional mobility training;Therapeutic activities;Therapeutic exercise;Balance training;Patient/family education    PT Goals (Current goals can be found in the Care Plan section)  Acute Rehab PT Goals Patient Stated Goal: "To get stronger" PT Goal Formulation: With patient Time For Goal Achievement: 06/10/16 Potential to Achieve Goals: Good    Frequency Min 2X/week   Barriers to discharge Decreased caregiver support;Inaccessible home environment      Co-evaluation               End of Session   Activity Tolerance: Patient tolerated treatment well Patient left: in bed;with call bell/phone within reach;with bed alarm set   PT Visit Diagnosis: Muscle weakness (generalized) (M62.81);Difficulty in walking, not elsewhere classified (R26.2)    Time: 4401-0272 PT Time Calculation (min) (ACUTE ONLY): 21 min   Charges:   PT Evaluation $PT Eval Low Complexity: 1 Procedure     PT G Codes:          Dorice Lamas, PT, DPT 05/27/2016, 3:20 PM

## 2016-05-27 NOTE — Progress Notes (Addendum)
Pharmacy Antibiotic Note  [name] is a 76 y.o. female admitted on 05/26/2016 with  cellulitis. BCID now showing Pseudomonas.   Pharmacy has been consulted for cefepime  dosing.  Plan: Will start patient on cefepime 2gm IV every 8 hours.  Will monitor renal function and adjust dose as needed.   Height: 5\' 6"  (167.6 cm) Weight: (!) 430 lb 1.9 oz (195.1 kg) IBW/kg (Calculated) : 59.3  Temp (24hrs), Avg:99.7 F (37.6 C), Min:98.8 F (37.1 C), Max:100.4 F (38 C)   Recent Labs Lab 05/26/16 1145 05/27/16 0306  WBC 24.8* 21.4*  CREATININE 0.85 1.09*    Estimated Creatinine Clearance: 80 mL/min (A) (by C-G formula based on SCr of 1.09 mg/dL (H)).    Allergies  Allergen Reactions  . Morphine And Related   . Vicodin [Hydrocodone-Acetaminophen] Other (See Comments)    Reaction:  Raises pts BP     Antimicrobials this admission: 3/23 ceftriaxone/azithromycin  >> x 1 dose 3/24 Unasyn  >> 3/24 3/24 cefepime >>  Dose adjustments this admission:  Microbiology results: 3/23 BCx: NG TD 3/23 UCx: sent   Thank you for allowing pharmacy to be a part of this patient's care.  Pernell Dupre, PharmD, BCPS Clinical Pharmacist 05/27/2016 9:38 AM

## 2016-05-28 LAB — CBC
HCT: 38.5 % (ref 35.0–47.0)
Hemoglobin: 13.1 g/dL (ref 12.0–16.0)
MCH: 31.1 pg (ref 26.0–34.0)
MCHC: 34.1 g/dL (ref 32.0–36.0)
MCV: 91.3 fL (ref 80.0–100.0)
Platelets: 203 10*3/uL (ref 150–440)
RBC: 4.21 MIL/uL (ref 3.80–5.20)
RDW: 13.5 % (ref 11.5–14.5)
WBC: 11.7 10*3/uL — ABNORMAL HIGH (ref 3.6–11.0)

## 2016-05-28 LAB — BASIC METABOLIC PANEL
Anion gap: 5 (ref 5–15)
BUN: 18 mg/dL (ref 6–20)
CO2: 26 mmol/L (ref 22–32)
Calcium: 8.7 mg/dL — ABNORMAL LOW (ref 8.9–10.3)
Chloride: 103 mmol/L (ref 101–111)
Creatinine, Ser: 0.99 mg/dL (ref 0.44–1.00)
GFR calc Af Amer: >60 mL/min (ref >60)
GFR calc non Af Amer: 54 mL/min — ABNORMAL LOW (ref >60)
Glucose, Bld: 111 mg/dL — ABNORMAL HIGH (ref 65–99)
Potassium: 3.8 mmol/L (ref 3.5–5.1)
Sodium: 134 mmol/L — ABNORMAL LOW (ref 135–145)

## 2016-05-28 MED ORDER — BENZONATATE 100 MG PO CAPS
100.0000 mg | ORAL_CAPSULE | Freq: Three times a day (TID) | ORAL | Status: DC
  Administered 2016-05-28 – 2016-05-30 (×8): 100 mg via ORAL
  Filled 2016-05-28 (×8): qty 1

## 2016-05-28 MED ORDER — GUAIFENESIN-DM 100-10 MG/5ML PO SYRP
5.0000 mL | ORAL_SOLUTION | ORAL | Status: DC | PRN
  Administered 2016-05-29 (×3): 5 mL via ORAL
  Filled 2016-05-28 (×4): qty 5

## 2016-05-28 MED ORDER — POLYVINYL ALCOHOL 1.4 % OP SOLN
1.0000 [drp] | Freq: Three times a day (TID) | OPHTHALMIC | Status: DC
  Administered 2016-05-28 – 2016-05-30 (×8): 1 [drp] via OPHTHALMIC
  Filled 2016-05-28 (×3): qty 15

## 2016-05-28 NOTE — Progress Notes (Signed)
[facility] [place] NAME: [name]    MR#:  [id number]  DATE OF BIRTH:  1940-04-05  SUBJECTIVE:   Patient had cough last night No fevers has had cough for several months now  REVIEW OF SYSTEMS:    Review of Systems  Constitutional: Negative.  Negative for chills, fever and malaise/fatigue.  HENT: Negative.  Negative for ear discharge, ear pain, hearing loss, nosebleeds and sore throat.   Eyes: Negative.  Negative for blurred vision and pain.  Respiratory: Positive for cough. Negative for hemoptysis, shortness of breath and wheezing.   Cardiovascular: Positive for leg swelling. Negative for chest pain and palpitations.  Gastrointestinal: Negative.  Negative for abdominal pain, blood in stool, diarrhea, nausea and vomiting.  Genitourinary: Negative.  Negative for dysuria.  Musculoskeletal: Negative.  Negative for back pain.  Skin: Negative.   Neurological: Negative for dizziness, tremors, speech change, focal weakness, seizures and headaches.  Endo/Heme/Allergies: Negative.  Does not bruise/bleed easily.  Psychiatric/Behavioral: Negative.  Negative for depression, hallucinations and suicidal ideas.    Tolerating Diet:yes      DRUG ALLERGIES:   Allergies  Allergen Reactions  . Morphine And Related   . Vicodin [Hydrocodone-Acetaminophen] Other (See Comments)    Reaction:  Raises pts BP     VITALS:  Blood pressure (!) 124/47, pulse 78, temperature 98.3 F (36.8 C), temperature source Oral, resp. rate 18, height 5\' 6"  (1.676 m), weight (!) 195.1 kg (430 lb 1.9 oz), last menstrual period 04/16/1993, SpO2 97 %.  PHYSICAL EXAMINATION:   Physical Exam  Constitutional: She is oriented to person, place, and time and well-developed, well-nourished, and in no distress. No distress.  HENT:  Head: Normocephalic.  Eyes: No scleral icterus.  Neck: Normal range of motion. Neck supple. No JVD present. No tracheal deviation present.   Cardiovascular: Normal rate, regular rhythm and normal heart sounds.  Exam reveals no gallop and no friction rub.   No murmur heard. Pulmonary/Chest: Effort normal and breath sounds normal. No respiratory distress. She has no wheezes. She has no rales. She exhibits no tenderness.  Abdominal: Soft. Bowel sounds are normal. She exhibits no distension and no mass. There is no tenderness. There is no rebound and no guarding.  Musculoskeletal: Normal range of motion. She exhibits edema.  Neurological: She is alert and oriented to person, place, and time.  Skin: Skin is warm. No rash noted. No erythema.  Left ankle with erythema, warmth and tenderness extends around ankle  Psychiatric: Affect and judgment normal.      LABORATORY PANEL:   CBC  Recent Labs Lab 05/28/16 0314  WBC 11.7*  HGB 13.1  HCT 38.5  PLT 203   ------------------------------------------------------------------------------------------------------------------  Chemistries   Recent Labs Lab 05/28/16 0314  NA 134*  K 3.8  CL 103  CO2 26  GLUCOSE 111*  BUN 18  CREATININE 0.99  CALCIUM 8.7*   ------------------------------------------------------------------------------------------------------------------  Cardiac Enzymes  Recent Labs Lab 05/26/16 1145  TROPONINI <0.03   ------------------------------------------------------------------------------------------------------------------  RADIOLOGY:  Dg Chest 2 View  Result Date: 05/27/2016 CLINICAL DATA:  Patient with recent diagnosis of pneumonia. EXAM: CHEST  2 VIEW COMPARISON:  Chest radiograph 05/26/2016; 03/23/2015 FINDINGS: Stable cardiac and mediastinal contours with calcification of the thoracic aorta. Small left pleural effusion and heterogeneous opacities left lung base. No pneumothorax. Thoracic spine degenerative changes. IMPRESSION: Small left pleural effusion and heterogeneous opacities left lung base which may represent infection in the  appropriate clinical setting. Aortic atherosclerosis.  Electronically Signed   By: Lovey Newcomer M.D.   On: 05/27/2016 09:37   Dg Chest Port 1 View  Result Date: 05/26/2016 CLINICAL DATA:  Wheezing, gradually worsening EXAM: PORTABLE CHEST 1 VIEW COMPARISON:  03/23/2015 FINDINGS: There is no focal consolidation. There is a trace left pleural effusion. There is no pneumothorax. The heart and mediastinal contours are unremarkable. The osseous structures are unremarkable. IMPRESSION: Trace left pleural effusion. Otherwise no active cardiopulmonary disease. Electronically Signed   By: Kathreen Devoid   On: 05/26/2016 12:05     ASSESSMENT AND PLAN:   76 year old morbidly obese female with a history of morbid obesity and hypothyroidism who presents with generalized weakness  1. Pseudomonas bacteremia on BCID;Continue Cefepime and follow up on final blood cx and sensitivities  2. Left lower extremity cellulitis/CAP: continue cefepime Add cough drops and cough syrup.  3. Generalized weakness: needs SNF at discharge   4. Hyperlipidemia: Continue statin  5 Hypothyroidism: Continue Synthroid    6. Morbid obesity: Weight loss is encouraged.  7. COPD without exacerbation: Continue duo nebs  8 Hyponatremia improved 9 Leukocytosis from cellulitis: Improved  Management plans discussed with the patient and she is in agreement.  CODE STATUS: full  TOTAL TIME TAKING CARE OF THIS PATIENT: 24 minutes.     POSSIBLE D/C tomorrow, DEPENDING ON CLINICAL CONDITION.   [name] M.D on 05/28/2016 at 9:48 AM  Between 7am to 6pm - Pager - [phone] After 6pm go to [email or web address] - password [email or web address] [facility]  Office  [phone]  CC: Primary care physician; [name], Brooke Cooper, DO  Note: This dictation was prepared with Dragon dictation along with smaller phrase technology. Any transcriptional errors that result from this process are unintentional.

## 2016-05-29 LAB — CULTURE, BLOOD (ROUTINE X 2): Special Requests: ADEQUATE

## 2016-05-29 MED ORDER — ENOXAPARIN SODIUM 40 MG/0.4ML ~~LOC~~ SOLN
40.0000 mg | Freq: Two times a day (BID) | SUBCUTANEOUS | Status: DC
  Administered 2016-05-29 – 2016-05-30 (×2): 40 mg via SUBCUTANEOUS
  Filled 2016-05-29 (×3): qty 0.4

## 2016-05-29 NOTE — Progress Notes (Signed)
Clinical Education officer, museum (CSW) presented bed offer and patient chose [facility]. [facility] admissions coordinator at [facility] came to see patient today and started [place] authorization. CSW director has approved for a 5 day LOG for pending [employer] authorization. Per Chrys Racer they will accept 5 day LOG. Adult Scientist, forensic (APS) worker Margreta Journey [phone] came to visit patient today. Per Margreta Journey APS has been trying to place patient from home. CSW will continue to follow and assist as needed.   [employer], LCSW [phone]

## 2016-05-29 NOTE — Progress Notes (Signed)
qPhysical Therapy Treatment Patient Details Name: [name] [facility] MRN: [id number] DOB: 08-Nov-1940 Today's Date: 05/29/2016    History of Present Illness Patient is a pleasant 76 y.o. female admitted on 23 March after experiencing increasing SOB and tremors over past few weeks. Found to have pneumonia. PMH includes COPD, depression, HLD, HTN, morbid obesity, apnea, and B LE edema.     PT Comments    Pt able to progress to standing (R knee blocked and bed height elevated) and marching in place x2 reps each B LE with 2 assist and use of bariatric RW.  Ambulation limited d/t pt becoming dizzy and hot in standing; once pt sat back down pt then c/o feeling like she was going to pass out (and with loss of balance backwards) so pt assisted back into semi-supine in bed (symptoms resolved with rest in bed).  Pt motivated to participate in physical therapy but currently appears below baseline in terms of functional mobility and activity tolerance.  Recommend pt discharge to STR when medically appropriate.    Follow Up Recommendations  SNF     Equipment Recommendations       Recommendations for Other Services       Precautions / Restrictions Precautions Precautions: Fall Restrictions Weight Bearing Restrictions: No    Mobility  Bed Mobility Overal bed mobility: Needs Assistance Bed Mobility: Supine to Sit;Sit to Supine;Rolling Rolling: Min assist (to L and to R)   Supine to sit: Mod assist;HOB elevated Sit to supine: Mod assist;HOB elevated   General bed mobility comments: assist with LE's supine to/from sit; pt able to pull on bedrails to scoot self up in bed with min assist.  Transfers Overall transfer level: Needs assistance Equipment used:  (Bariatric RW) Transfers: Sit to/from Stand Sit to Stand: Min assist;Mod assist;+2 physical assistance;From elevated surface         General transfer comment: x2 trials; R knee blocked both attempts; pt pushing B UE's off of bedrails to  stand; bed height elevated to assist with standing; assist to initiate stand  Ambulation/Gait Ambulation/Gait assistance: Min assist;+2 physical assistance Ambulation Distance (Feet):  (pt able to march in place x2 reps B LE's) Assistive device:  (Bariatric RW)   Gait velocity: decreased   General Gait Details: decreased B foot clearance; increased B lateral sway; limited ambulation d/t pt reporting getting dizzy and hot while marching in place (pt assisted back into sitting with posterior loss of balance and reporting feeling like she was going to pass out so pt assisted back into bed)   Stairs            Wheelchair Mobility    Modified Rankin (Stroke Patients Only)       Balance Overall balance assessment: Needs assistance Sitting-balance support: Bilateral upper extremity supported;Feet supported Sitting balance-Leahy Scale: Fair Sitting balance - Comments: static sitting   Standing balance support: Bilateral upper extremity supported (on bariatric RW) Standing balance-Leahy Scale: Fair Standing balance comment: static standing                            Cognition Arousal/Alertness: Awake/alert Behavior During Therapy: WFL for tasks assessed/performed Overall Cognitive Status: Within Functional Limits for tasks assessed                                        Exercises  General Comments   Pt agreeable to PT session.      Pertinent Vitals/Pain Pain Assessment: No/denies pain  Vitals (HR and O2 on room air) stable and WFL throughout treatment session.    Home Living                      Prior Function            PT Goals (current goals can now be found in the care plan section) Acute Rehab PT Goals Patient Stated Goal: "To get stronger" PT Goal Formulation: With patient Time For Goal Achievement: 06/10/16 Potential to Achieve Goals: Good Progress towards PT goals: Progressing toward goals    Frequency     Min 2X/week      PT Plan Current plan remains appropriate    Co-evaluation             End of Session Equipment Utilized During Treatment: Gait belt Activity Tolerance: Patient limited by fatigue Patient left: in bed;with call bell/phone within reach (unable to set bed alarm without immediately going off without any patient movement (nursing notified)) Nurse Communication: Mobility status;Precautions PT Visit Diagnosis: Muscle weakness (generalized) (M62.81);Difficulty in walking, not elsewhere classified (R26.2)     Time: 2820-8138 PT Time Calculation (min) (ACUTE ONLY): 39 min  Charges:  $Therapeutic Activity: 38-52 mins                    G CodesLeitha Bleak, PT 05/29/16, 12:01 PM [phone]

## 2016-05-29 NOTE — Progress Notes (Signed)
[facility] [place] NAME: [name]    MR#:  [id number]  DATE OF BIRTH:  May 24, 1940  SUBJECTIVE:   Patient had cough last night No fevers has had cough for several months now  She feels much better than time of admission. She did not participate with PT, and have lot of complains about her husband's behaviour.  REVIEW OF SYSTEMS:    Review of Systems  Constitutional: Negative.  Negative for chills, fever and malaise/fatigue.  HENT: Negative.  Negative for ear discharge, ear pain, hearing loss, nosebleeds and sore throat.   Eyes: Negative.  Negative for blurred vision and pain.  Respiratory: Positive for cough. Negative for hemoptysis, shortness of breath and wheezing.   Cardiovascular: Positive for leg swelling. Negative for chest pain and palpitations.  Gastrointestinal: Negative.  Negative for abdominal pain, blood in stool, diarrhea, nausea and vomiting.  Genitourinary: Negative.  Negative for dysuria.  Musculoskeletal: Negative.  Negative for back pain.  Skin: Negative.   Neurological: Negative for dizziness, tremors, speech change, focal weakness, seizures and headaches.  Endo/Heme/Allergies: Negative.  Does not bruise/bleed easily.  Psychiatric/Behavioral: Negative.  Negative for depression, hallucinations and suicidal ideas.    Tolerating Diet:yes    DRUG ALLERGIES:   Allergies  Allergen Reactions  . Morphine And Related   . Vicodin [Hydrocodone-Acetaminophen] Other (See Comments)    Reaction:  Raises pts BP     VITALS:  Blood pressure (!) 121/56, pulse 75, temperature 98.4 F (36.9 C), temperature source Axillary, resp. rate 18, height 5\' 6"  (1.676 m), weight (!) 195.1 kg (430 lb 1.9 oz), last menstrual period 04/16/1993, SpO2 95 %.  PHYSICAL EXAMINATION:   Physical Exam  Constitutional: She is oriented to person, place, and time and well-developed, well-nourished, and in no distress. No distress.  HENT:  Head:  Normocephalic.  Eyes: No scleral icterus.  Neck: Normal range of motion. Neck supple. No JVD present. No tracheal deviation present.  Cardiovascular: Normal rate, regular rhythm and normal heart sounds.  Exam reveals no gallop and no friction rub.   No murmur heard. Pulmonary/Chest: Effort normal and breath sounds normal. No respiratory distress. She has no wheezes. She has no rales. She exhibits no tenderness.  Abdominal: Soft. Bowel sounds are normal. She exhibits no distension and no mass. There is no tenderness. There is no rebound and no guarding.  Musculoskeletal: Normal range of motion. She exhibits edema.  Neurological: She is alert and oriented to person, place, and time.  Skin: Skin is warm. No rash noted. No erythema.  Left ankle with erythema, warmth and tenderness extends around ankle  Psychiatric: Affect and judgment normal.     LABORATORY PANEL:   CBC  Recent Labs Lab 05/28/16 0314  WBC 11.7*  HGB 13.1  HCT 38.5  PLT 203   ------------------------------------------------------------------------------------------------------------------  Chemistries   Recent Labs Lab 05/28/16 0314  NA 134*  K 3.8  CL 103  CO2 26  GLUCOSE 111*  BUN 18  CREATININE 0.99  CALCIUM 8.7*   ------------------------------------------------------------------------------------------------------------------  Cardiac Enzymes  Recent Labs Lab 05/26/16 1145  TROPONINI <0.03   ------------------------------------------------------------------------------------------------------------------  RADIOLOGY:  No results found.   ASSESSMENT AND PLAN:   76 year old morbidly obese female with a history of morbid obesity and hypothyroidism who presents with generalized weakness  1. Pseudomonas bacteremia on BCID;Continue Cefepime and follow up on final blood cx and sensitivities  pending final sensitivity report, Will send repeat cx today to check clearance.  2.  Left lower  extremity cellulitis/CAP: continue cefepime Add cough drops and cough syrup.   She feels getting better.  3. Generalized weakness: needs SNF at discharge  repeat PT eval, a she did not get up with them.  4. Hyperlipidemia: Continue statin  5 Hypothyroidism: Continue Synthroid   6. Morbid obesity: Weight loss is encouraged.  7. COPD without exacerbation: Continue duo nebs  8 Hyponatremia improved 9 Leukocytosis from cellulitis: Improved  Management plans discussed with the patient and she is in agreement.  CODE STATUS: full  TOTAL TIME TAKING CARE OF THIS PATIENT: 30 minutes.    POSSIBLE D/C tomorrow, DEPENDING ON CLINICAL CONDITION.   Vaughan Basta M.D on 05/29/2016 at 9:09 AM  Between 7am to 6pm - Pager - [phone] After 6pm go to [email or web address] - password [email or web address] [facility]  Office  [phone]  CC: Primary care physician; [name], Claretha Cooper, DO  Note: This dictation was prepared with Dragon dictation along with smaller phrase technology. Any transcriptional errors that result from this process are unintentional.

## 2016-05-30 MED ORDER — CIPROFLOXACIN HCL 500 MG PO TABS
500.0000 mg | ORAL_TABLET | Freq: Two times a day (BID) | ORAL | 0 refills | Status: AC
Start: 2016-05-30 — End: 2016-06-09

## 2016-05-30 MED ORDER — DOCUSATE SODIUM 100 MG PO CAPS: 100.0000 mg | ORAL_CAPSULE | Freq: Two times a day (BID) | ORAL | 0 refills | Status: DC | PRN

## 2016-05-30 MED ORDER — BENZONATATE 100 MG PO CAPS: 100.0000 mg | ORAL_CAPSULE | Freq: Three times a day (TID) | ORAL | 0 refills | Status: DC

## 2016-05-30 NOTE — Discharge Summary (Signed)
[facility] [place] NAME: [name]    MR#:  [id number]  DATE OF BIRTH:  11-Jul-1940  DATE OF ADMISSION:  05/26/2016 ADMITTING PHYSICIAN: Vaughan Basta, MD  DATE OF DISCHARGE: No discharge date for patient encounter.  PRIMARY CARE PHYSICIAN: Velta Addison, Claretha Cooper, DO    ADMISSION DIAGNOSIS:  Shortness of breath [R06.02] Pneumonia [J18.9] Resting tremor [R25.9] Pleural effusion on left [J90] Community acquired pneumonia of left lung, unspecified part of lung [J18.9]  DISCHARGE DIAGNOSIS:  Principal Problem:   Community acquired pneumonia Active Problems:   Pneumonia   Pressure injury of skin   Bacteremia with pseudomonas- sensitive to cipro.  SECONDARY DIAGNOSIS:   Past Medical History:  Diagnosis Date  . Acute dermatitis   . Asthma   . COPD (chronic obstructive pulmonary disease) ([place])   . Depression   . GERD (gastroesophageal reflux disease)   . Hyperlipidemia   . Hypertension   . Hypothyroidism   . Incontinence   . Morbid obesity ([place])   . Osteoarthritis   . Osteoarthritis   . PMB (postmenopausal bleeding)   . Sleep apnea   . Vaginal atrophy   . Vaginal Pap smear, abnormal     HOSPITAL COURSE:   76 year old morbidly obese female with a history of morbid obesity and hypothyroidism who presents with generalized weakness  1. Pseudomonas bacteremia on BCID;Continue Cefepime and follow up on final blood cx and sensitivities As per final sensitivity- sensitive to cipro- give 10 more days oral.  Repeat bl cx negative.  2. Left lower extremity cellulitis/CAP: continue cefepime Add cough drops and cough syrup.   She feels getting better.  3. Generalized weakness: needs SNF at discharge  4. Hyperlipidemia: Continue statin  5 Hypothyroidism: Continue Synthroid   6. Morbid obesity: Weight loss is encouraged.  7. COPD without exacerbation: Continue duo nebs  8 Hyponatremia improved 9  Leukocytosis from cellulitis: Improved  DISCHARGE CONDITIONS:   Stable.  CONSULTS OBTAINED:  Treatment Team:  Vaughan Basta, MD  DRUG ALLERGIES:   Allergies  Allergen Reactions  . Morphine And Related   . Vicodin [Hydrocodone-Acetaminophen] Other (See Comments)    Reaction:  Raises pts BP     DISCHARGE MEDICATIONS:   Current Discharge Medication List    START taking these medications   Details  benzonatate (TESSALON) 100 MG capsule Take 1 capsule (100 mg total) by mouth 3 (three) times daily. Qty: 20 capsule, Refills: 0    ciprofloxacin (CIPRO) 500 MG tablet Take 1 tablet (500 mg total) by mouth 2 (two) times daily. Qty: 20 tablet, Refills: 0    docusate sodium (COLACE) 100 MG capsule Take 1 capsule (100 mg total) by mouth 2 (two) times daily as needed for mild constipation. Qty: 10 capsule, Refills: 0      CONTINUE these medications which have NOT CHANGED   Details  acetaminophen (TYLENOL) 500 MG tablet Take 500 mg by mouth every 6 (six) hours as needed.    ADVAIR DISKUS 250-50 MCG/DOSE AEPB Inhale 1 puff into the lungs 2 (two) times daily.    albuterol (PROVENTIL HFA;VENTOLIN HFA) 108 (90 BASE) MCG/ACT inhaler Inhale 2 puffs into the lungs every 6 (six) hours as needed for wheezing or shortness of breath.     aspirin 81 MG chewable tablet Chew 81 mg by mouth daily.    Calcium Carbonate-Vitamin D (CALCIUM 600+D) 600-400 MG-UNIT tablet Take 1 tablet by mouth daily after lunch.     furosemide (LASIX)  20 MG tablet Take 1 tablet (20 mg total) by mouth daily. Qty: 30 tablet, Refills: 1    hydrocortisone cream 1 % Apply 1 application topically daily.    levothyroxine (SYNTHROID, LEVOTHROID) 125 MCG tablet Take 125 mcg by mouth daily before breakfast.    Misc Natural Products (OSTEO BI-FLEX ADV TRIPLE ST) TABS Take 1 tablet by mouth daily.    nystatin (MYCOSTATIN/NYSTOP) powder Apply 1 g topically daily as needed.    Polyvinyl Alcohol-Povidone (REFRESH OP)  Apply 1 drop to eye as needed (for dry eyes).    potassium chloride SA (K-DUR,KLOR-CON) 20 MEQ tablet Take 1 tablet (20 mEq total) by mouth daily. Qty: 30 tablet, Refills: 5    Turmeric 500 MG CAPS Take 500 mg by mouth daily.    atorvastatin (LIPITOR) 20 MG tablet Take 20 mg by mouth at bedtime.       STOP taking these medications     amLODipine (NORVASC) 5 MG tablet      lovastatin (MEVACOR) 20 MG tablet      doxycycline (VIBRAMYCIN) 100 MG capsule          DISCHARGE INSTRUCTIONS:    Follow with PMD in 2 weeks.  If you experience worsening of your admission symptoms, develop shortness of breath, life threatening emergency, suicidal or homicidal thoughts you must seek medical attention immediately by calling 911 or calling your MD immediately  if symptoms less severe.  You Must read complete instructions/literature along with all the possible adverse reactions/side effects for all the Medicines you take and that have been prescribed to you. Take any new Medicines after you have completely understood and accept all the possible adverse reactions/side effects.   Please note  You were cared for by a hospitalist during your hospital stay. If you have any questions about your discharge medications or the care you received while you were in the hospital after you are discharged, you can call the unit and asked to speak with the hospitalist on call if the hospitalist that took care of you is not available. Once you are discharged, your primary care physician will handle any further medical issues. Please note that NO REFILLS for any discharge medications will be authorized once you are discharged, as it is imperative that you return to your primary care physician (or establish a relationship with a primary care physician if you do not have one) for your aftercare needs so that they can reassess your need for medications and monitor your lab values.    Today   CHIEF COMPLAINT:    Chief Complaint  Patient presents with  . Tremors    HISTORY OF PRESENT ILLNESS:  [name]  is a 76 y.o. female with a known history of COPD, depression, hyperlipidemia, hypertension, morbid obesity, sleep apnea, bilateral leg swelling- home at her baseline and requires some support to get out of her couch and get up and walk with her walker. She uses inhalers at her baseline for her COPD. For last few days she is feeling increasingly short of breath and some more weakness. She also have shaking on both her arms and she feels having some chills for last few days. Concerned with this she called EMS and brought to emergency room. Her urinalysis is negative and her skin exam up to her legs checked by me is negative, she is morbidly obese but as per the ER nurse and her assistant who had examined her rolled her over and checked her skin thoroughly denied having  any cellulitis or ulcers. Of the chest she is noted to have some pleural effusion on the left side and elevated white blood cell counts with slightly low blood pressure in ER, concerned with this she is suspected to have pneumonia and given to admission to hospitalist team.   VITAL SIGNS:  Blood pressure (!) 102/45, pulse 72, temperature 98.3 F (36.8 C), temperature source Oral, resp. rate 20, height 5\' 6"  (1.676 m), weight (!) 195.1 kg (430 lb 1.9 oz), last menstrual period 04/16/1993, SpO2 95 %.  I/O:   Intake/Output Summary (Last 24 hours) at 05/30/16 0921 Last data filed at 05/30/16 0407  Gross per 24 hour  Intake              420 ml  Output                0 ml  Net              420 ml    PHYSICAL EXAMINATION:  Constitutional: She is oriented to person, place, and time and well-developed, well-nourished, and in no distress. No distress.  HENT:  Head: Normocephalic.  Eyes: No scleral icterus.  Neck: Normal range of motion. Neck supple. No JVD present. No tracheal deviation present.  Cardiovascular: Normal rate, regular  rhythm and normal heart sounds.  Exam reveals no gallop and no friction rub.   No murmur heard. Pulmonary/Chest: Effort normal and breath sounds normal. No respiratory distress. She has no wheezes. She has no rales. She exhibits no tenderness.  Abdominal: Soft. Bowel sounds are normal. She exhibits no distension and no mass. There is no tenderness. There is no rebound and no guarding.  Musculoskeletal: Normal range of motion. She exhibits edema- which appears chronic lymphatic type.Marland Kitchen  Neurological: She is alert and oriented to person, place, and time.  Skin: Skin is warm. No rash noted. No erythema.  Psychiatric: Affect and judgment normal.   DATA REVIEW:   CBC  Recent Labs Lab 05/28/16 0314  WBC 11.7*  HGB 13.1  HCT 38.5  PLT 203    Chemistries   Recent Labs Lab 05/28/16 0314  NA 134*  K 3.8  CL 103  CO2 26  GLUCOSE 111*  BUN 18  CREATININE 0.99  CALCIUM 8.7*    Cardiac Enzymes  Recent Labs Lab 05/26/16 1145  [place] <0.03    Microbiology Results  Results for orders placed or performed during the hospital encounter of 05/26/16  Urine culture     Status: None   Collection Time: 05/26/16 11:45 AM  Result Value Ref Range Status   Specimen Description URINE, RANDOM  Final   Special Requests NONE  Final   Culture   Final    NO GROWTH Performed at [facility], [place] [address], [place], [place] [address]    Report Status 05/27/2016 FINAL  Final  Culture, blood (routine x 2)     Status: Abnormal   Collection Time: 05/26/16 12:56 PM  Result Value Ref Range Status   Specimen Description BLOOD LEFT AC  Final   Special Requests   Final    BOTTLES DRAWN AEROBIC AND ANAEROBIC Blood Culture adequate volume   Culture  Setup Time   Final    GRAM NEGATIVE RODS AEROBIC BOTTLE ONLY CRITICAL RESULT CALLED TO, READ BACK BY AND VERIFIED WITH: HENRY ZOMPA ON 05/27/16 AT 1139 [facility] Performed at [facility], [place] [address] [place], [place] [address]    Culture  PSEUDOMONAS AERUGINOSA (A)  Final   Report Status 05/29/2016 FINAL  Final   Organism ID, Bacteria PSEUDOMONAS AERUGINOSA  Final      Susceptibility   Pseudomonas aeruginosa - MIC*    CEFTAZIDIME 2 SENSITIVE Sensitive     CIPROFLOXACIN <=0.25 SENSITIVE Sensitive     GENTAMICIN <=1 SENSITIVE Sensitive     IMIPENEM 2 SENSITIVE Sensitive     PIP/TAZO <=4 SENSITIVE Sensitive     CEFEPIME <=1 SENSITIVE Sensitive     * PSEUDOMONAS AERUGINOSA  Culture, blood (routine x 2)     Status: None (Preliminary result)   Collection Time: 05/26/16 12:56 PM  Result Value Ref Range Status   Specimen Description BLOOD RIGHT AC  Final   Special Requests   Final    BOTTLES DRAWN AEROBIC AND ANAEROBIC Blood Culture adequate volume   Culture NO GROWTH 4 DAYS  Final   Report Status PENDING  Incomplete  Blood Culture ID Panel (Reflexed)     Status: Abnormal   Collection Time: 05/26/16 12:56 PM  Result Value Ref Range Status   Enterococcus species NOT DETECTED NOT DETECTED Final   Listeria monocytogenes NOT DETECTED NOT DETECTED Final   Staphylococcus species NOT DETECTED NOT DETECTED Final   Staphylococcus aureus NOT DETECTED NOT DETECTED Final   Streptococcus species NOT DETECTED NOT DETECTED Final   Streptococcus agalactiae NOT DETECTED NOT DETECTED Final   Streptococcus pneumoniae NOT DETECTED NOT DETECTED Final   Streptococcus pyogenes NOT DETECTED NOT DETECTED Final   Acinetobacter baumannii NOT DETECTED NOT DETECTED Final   Enterobacteriaceae species NOT DETECTED NOT DETECTED Final   Enterobacter cloacae complex NOT DETECTED NOT DETECTED Final   Escherichia coli NOT DETECTED NOT DETECTED Final   Klebsiella oxytoca NOT DETECTED NOT DETECTED Final   Klebsiella pneumoniae NOT DETECTED NOT DETECTED Final   Proteus species NOT DETECTED NOT DETECTED Final   Serratia marcescens NOT DETECTED NOT DETECTED Final   Carbapenem resistance NOT DETECTED NOT DETECTED Final   Haemophilus influenzae NOT DETECTED NOT  DETECTED Final   Neisseria meningitidis NOT DETECTED NOT DETECTED Final   Pseudomonas aeruginosa DETECTED (A) NOT DETECTED Final    Comment: CRITICAL RESULT CALLED TO, READ BACK BY AND VERIFIED WITH: HENRY ZOMPA ON 05/27/16 AT 1139 [place]    Candida albicans NOT DETECTED NOT DETECTED Final   Candida glabrata NOT DETECTED NOT DETECTED Final   Candida krusei NOT DETECTED NOT DETECTED Final   Candida parapsilosis NOT DETECTED NOT DETECTED Final   Candida tropicalis NOT DETECTED NOT DETECTED Final  Culture, blood (single) w Reflex to ID Panel     Status: None (Preliminary result)   Collection Time: 05/29/16 11:02 AM  Result Value Ref Range Status   Specimen Description BLOOD L AC  Final   Special Requests   Final    BOTTLES DRAWN AEROBIC AND ANAEROBIC Blood Culture adequate volume   Culture NO GROWTH < 24 HOURS  Final   Report Status PENDING  Incomplete    RADIOLOGY:  No results found.  EKG:   Orders placed or performed during the hospital encounter of 05/26/16  . ED EKG  . ED EKG      Management plans discussed with the patient, family and they are in agreement.  CODE STATUS:     Code Status Orders        Start     Ordered   05/26/16 1529  Limited resuscitation (code)  Continuous    Question Answer Comment  In the event of cardiac or respiratory ARREST: Initiate  Code Blue, Call Rapid Response Yes   In the event of cardiac or respiratory ARREST: Perform CPR Yes   In the event of cardiac or respiratory ARREST: Perform Intubation/Mechanical Ventilation No   In the event of cardiac or respiratory ARREST: Use NIPPV/BiPAp only if indicated Yes   In the event of cardiac or respiratory ARREST: Administer ACLS medications if indicated Yes   In the event of cardiac or respiratory ARREST: Perform Defibrillation or Cardioversion if indicated Yes      05/26/16 1528    Code Status History    Date Active Date Inactive Code Status Order ID Comments User Context   03/23/2015  8:44 PM  03/25/2015  9:44 PM Full Code [id number]  Lytle Butte, MD ED    Advance Directive Documentation     Most Recent Value  Type of Advance Directive  Healthcare Power of Ward, Living will  Pre-existing out of facility DNR order (yellow form or pink MOST form)  -  "MOST" Form in Place?  -      TOTAL TIME TAKING CARE OF THIS PATIENT: 35 minutes.    Vaughan Basta M.D on 05/30/2016 at 9:21 AM  Between 7am to 6pm - Pager - [phone]  After 6pm go to [email or web address] - password [email or web address] [facility]  Office  [phone]  CC: Primary care physician; Velta Addison, Claretha Cooper, DO   Note: This dictation was prepared with Dragon dictation along with smaller phrase technology. Any transcriptional errors that result from this process are unintentional.

## 2016-05-30 NOTE — NC FL2 (Signed)
[place] LEVEL OF CARE SCREENING TOOL     IDENTIFICATION  Patient Name: [name] [facility] Birthdate: [birth year] Sex: female Admission Date (Current Location): 05/26/2016  [place] and [place] Number:  Engineering geologist and Address:  [facility], [address], [place], [place] [address]      Provider Number: [id number]  Attending Physician Name and Address:  Vaughan Basta, MD  Relative Name and Phone Number:       Current Level of Care: Hospital Recommended Level of Care: [place] Prior Approval Number:    Date Approved/Denied:   PASRR Number:  ([id number] )  Discharge Plan: SNF    Current Diagnoses: Patient Active Problem List   Diagnosis Date Noted  . Community acquired pneumonia 05/26/2016  . Pneumonia 05/26/2016  . Pressure injury of skin 05/26/2016  . Pressure ulcer 03/24/2015  . Cellulitis 03/23/2015  . Vaginal atrophy 08/19/2014  . Asthma, chronic 04/18/2012  . Obstructive sleep apnea 04/18/2012  . Essential hypertension, benign 04/18/2012  . Hypercholesterolemia 04/18/2012  . Aortic stenosis 04/18/2012  . Endometrial cancer ([place]) 04/18/2012  . Diverticulosis of colon without hemorrhage 04/18/2012  . Unspecified hypothyroidism 04/18/2012  . Overactive bladder 04/18/2012    Orientation RESPIRATION BLADDER Height & Weight     Self, Time, Situation, Place  Normal Incontinent Weight: (!) 430 lb 1.9 oz (195.1 kg) Height:  5\' 6"  (167.6 cm)  BEHAVIORAL SYMPTOMS/MOOD NEUROLOGICAL BOWEL NUTRITION STATUS   (none)  (none) Incontinent Diet (Diet: 2 gram sodium. )  AMBULATORY STATUS COMMUNICATION OF NEEDS Skin   Extensive Assist Verbally Normal                       Personal Care Assistance Level of Assistance  Bathing, Feeding, Dressing Bathing Assistance: Limited assistance Feeding assistance: Independent Dressing Assistance: Limited assistance     Functional Limitations Info   Sight, Hearing, Speech Sight Info: Adequate Hearing Info: Adequate Speech Info: Adequate    SPECIAL CARE FACTORS FREQUENCY  PT (By licensed PT), OT (By licensed OT)     PT Frequency:  (5) OT Frequency:  (5)            Contractures      Additional Factors Info  Code Status, Allergies Code Status Info:  (partial code. ) Allergies Info:  (Vicodin Hydrocodone-acetaminophen)           Current Medications (05/30/2016):  This is the current hospital active medication list Current Facility-Administered Medications  Medication Dose Route Frequency Provider Last Rate Last Dose  . acetaminophen (TYLENOL) tablet 500 mg  500 mg Oral Q6H PRN Vaughan Basta, MD      . albuterol (PROVENTIL) (2.5 MG/3ML) 0.083% nebulizer solution 2.5 mg  2.5 mg Inhalation Q6H PRN Vaughan Basta, MD      . aspirin chewable tablet 81 mg  81 mg Oral Daily Vaughan Basta, MD   81 mg at 05/29/16 0826  . atorvastatin (LIPITOR) tablet 20 mg  20 mg Oral QHS Vaughan Basta, MD   20 mg at 05/29/16 2131  . benzonatate (TESSALON) capsule 100 mg  100 mg Oral TID Bettey Costa, MD   100 mg at 05/29/16 2131  . calcium-vitamin D (OSCAL WITH D) 500-200 MG-UNIT per tablet 1 tablet  1 tablet Oral QPC lunch Vaughan Basta, MD   1 tablet at 05/29/16 1321  . ceFEPIme (MAXIPIME) 2 g in dextrose 5 % 50 mL IVPB  2 g Intravenous [address], RPH  2 g at 05/30/16 0525  . docusate sodium (COLACE) capsule 100 mg  100 mg Oral BID PRN Vaughan Basta, MD   100 mg at 05/29/16 0826  . enoxaparin (LOVENOX) injection 40 mg  40 mg Subcutaneous Q12H Vaughan Basta, MD   40 mg at 05/30/16 0801  . furosemide (LASIX) tablet 20 mg  20 mg Oral Daily Vaughan Basta, MD   20 mg at 05/29/16 0826  . guaiFENesin-dextromethorphan (ROBITUSSIN DM) 100-10 MG/5ML syrup 5 mL  5 mL Oral Q4H PRN Bettey Costa, MD   5 mL at 05/29/16 1701  . hydrocortisone cream 1 % 1 application  1 application Topical  Daily PRN Vaughan Basta, MD      . levothyroxine (SYNTHROID, LEVOTHROID) tablet 125 mcg  125 mcg Oral QAC breakfast Vaughan Basta, MD   125 mcg at 05/30/16 0801  . nystatin (MYCOSTATIN/NYSTOP) topical powder 1 g  1 g Topical BID PRN Vaughan Basta, MD      . polyvinyl alcohol (LIQUIFILM TEARS) 1.4 % ophthalmic solution 1 drop  1 drop Both Eyes TID Bettey Costa, MD   1 drop at 05/29/16 2131  . potassium chloride SA (K-DUR,KLOR-CON) CR tablet 20 mEq  20 mEq Oral Daily Vaughan Basta, MD   20 mEq at 05/29/16 1552     Discharge Medications: Please see discharge summary for a list of discharge medications.  Relevant Imaging Results:  Relevant Lab Results:   Additional Information  (SSN: [id number])  [place], [place] Beets, LCSW

## 2016-05-30 NOTE — Progress Notes (Signed)
Pt being discharge today SNF today. PIV removed. Report called to [facility], all questions answered. Discharge packet provided, including instructions and prescriptions needed. She is leaving with all her belongings. Patient and family are aware and in agreement with plan. She will be transported via EMS.

## 2016-05-30 NOTE — Progress Notes (Addendum)
Patient is medically stable for D/C to [facility] today. Per [facility] admissions coordinator at [facility] authorization is pending and they will accept a 5 day LOG. Clinical Social Work (CSW) Mudlogger approved 5 day LOG. Per [facility] EMS will have to be arranged after 4:00 because they are waiting on a bariatric bed to be delivered to the facility. Per Chrys Racer patient can come to private room [phone]. RN will call report and arrange EMS after 4:00. CSW sent D/C orders to [facility] via [place]. Patient is aware of above and understands that if [place] does not approve SNF then she will have to D/C from [facility] after 5 days. CSW also left a voicemail for Adult Administrator, Civil Service (APS) worker Margreta Journey making her aware of above and asking her to assist with long term care medicaid application. CSW contacted [name] and made him aware of above. Son is in agreement with plan. Please reconsult if future social work needs arise. CSW signing off.   [employer], LCSW [phone]

## 2016-05-30 NOTE — Clinical Social Work Placement (Signed)
   CLINICAL SOCIAL WORK PLACEMENT  NOTE  Date:  05/30/2016  Patient Details  Name: [name] [facility] MRN: [id number] Date of Birth: June 21, 1940  Clinical Social Work is seeking post-discharge placement for this patient at the [place] level of care (*CSW will initial, date and re-position this form in  chart as items are completed):  Yes   Patient/family provided with [place] Work Department's list of facilities offering this level of care within the geographic area requested by the patient (or if unable, by the patient's family).  Yes   Patient/family informed of their freedom to choose among providers that offer the needed level of care, that participate in Medicare, Medicaid or managed care program needed by the patient, have an available bed and are willing to accept the patient.  Yes   Patient/family informed of [place]'s ownership interest in [facility] and [facility], as well as of the fact that they are under no obligation to receive care at these facilities.  PASRR submitted to EDS on       PASRR number received on       Existing PASRR number confirmed on 05/26/16     FL2 transmitted to all facilities in geographic area requested by pt/family on 05/26/16     FL2 transmitted to all facilities within larger geographic area on       Patient informed that his/her managed care company has contracts with or will negotiate with certain facilities, including the following:        Yes   Patient/family informed of bed offers received.  Patient chooses bed at  [facility] )     Physician recommends and patient chooses bed at      Patient to be transferred to  [facility] ) on 05/30/16.  Patient to be transferred to facility by  [facility] )     Patient family notified on 05/30/16 of transfer.  Name of family member notified: [name] is aware of D/C today.        PHYSICIAN     Additional Comment:    _______________________________________________ Milan Perkins, [place] Beets, LCSW 05/30/2016, 10:51 AM

## 2016-05-31 LAB — CULTURE, BLOOD (ROUTINE X 2)
Culture: NO GROWTH
Special Requests: ADEQUATE

## 2016-06-03 LAB — CULTURE, BLOOD (SINGLE)
Culture: NO GROWTH
Special Requests: ADEQUATE

## 2016-09-01 ENCOUNTER — Emergency Department: Payer: Medicare HMO

## 2016-09-01 ENCOUNTER — Encounter: Payer: Self-pay | Admitting: Emergency Medicine

## 2016-09-01 ENCOUNTER — Emergency Department
Admission: EM | Admit: 2016-09-01 | Discharge: 2016-09-02 | Disposition: A | Discharge status: Home / Self Care | Payer: Medicare HMO | Attending: Emergency Medicine | Admitting: Emergency Medicine

## 2016-09-01 DIAGNOSIS — Z856 Personal history of leukemia: Secondary | ICD-10-CM | POA: Diagnosis not present

## 2016-09-01 DIAGNOSIS — Z853 Personal history of malignant neoplasm of breast: Secondary | ICD-10-CM | POA: Diagnosis not present

## 2016-09-01 DIAGNOSIS — I10 Essential (primary) hypertension: Secondary | ICD-10-CM | POA: Insufficient documentation

## 2016-09-01 DIAGNOSIS — Z7982 Long term (current) use of aspirin: Secondary | ICD-10-CM | POA: Insufficient documentation

## 2016-09-01 DIAGNOSIS — Z8542 Personal history of malignant neoplasm of other parts of uterus: Secondary | ICD-10-CM | POA: Diagnosis not present

## 2016-09-01 DIAGNOSIS — Z8543 Personal history of malignant neoplasm of ovary: Secondary | ICD-10-CM | POA: Diagnosis not present

## 2016-09-01 DIAGNOSIS — R319 Hematuria, unspecified: Secondary | ICD-10-CM | POA: Diagnosis present

## 2016-09-01 DIAGNOSIS — Z79899 Other long term (current) drug therapy: Secondary | ICD-10-CM | POA: Insufficient documentation

## 2016-09-01 DIAGNOSIS — J449 Chronic obstructive pulmonary disease, unspecified: Secondary | ICD-10-CM | POA: Diagnosis not present

## 2016-09-01 DIAGNOSIS — G309 Alzheimer's disease, unspecified: Secondary | ICD-10-CM | POA: Insufficient documentation

## 2016-09-01 DIAGNOSIS — J45909 Unspecified asthma, uncomplicated: Secondary | ICD-10-CM | POA: Diagnosis not present

## 2016-09-01 DIAGNOSIS — E039 Hypothyroidism, unspecified: Secondary | ICD-10-CM | POA: Insufficient documentation

## 2016-09-01 DIAGNOSIS — R531 Weakness: Secondary | ICD-10-CM | POA: Insufficient documentation

## 2016-09-01 DIAGNOSIS — E119 Type 2 diabetes mellitus without complications: Secondary | ICD-10-CM | POA: Insufficient documentation

## 2016-09-01 LAB — COMPREHENSIVE METABOLIC PANEL
ALT: 23 U/L (ref 14–54)
AST: 28 U/L (ref 15–41)
Albumin: 4 g/dL (ref 3.5–5.0)
Alkaline Phosphatase: 64 U/L (ref 38–126)
Anion gap: 7 (ref 5–15)
BUN: 19 mg/dL (ref 6–20)
CO2: 28 mmol/L (ref 22–32)
Calcium: 9.5 mg/dL (ref 8.9–10.3)
Chloride: 101 mmol/L (ref 101–111)
Creatinine, Ser: 1.05 mg/dL — ABNORMAL HIGH (ref 0.44–1.00)
GFR calc Af Amer: 59 mL/min — ABNORMAL LOW (ref >60)
GFR calc non Af Amer: 51 mL/min — ABNORMAL LOW (ref >60)
Glucose, Bld: 130 mg/dL — ABNORMAL HIGH (ref 65–99)
Potassium: 4.2 mmol/L (ref 3.5–5.1)
Sodium: 136 mmol/L (ref 135–145)
Total Bilirubin: 0.7 mg/dL (ref 0.3–1.2)
Total Protein: 7.5 g/dL (ref 6.5–8.1)

## 2016-09-01 LAB — TROPONIN I: Troponin I: <0.03 ng/mL (ref <0.03)

## 2016-09-01 NOTE — ED Provider Notes (Signed)
[facility] Emergency Department Provider Note    First MD Initiated Contact with Patient 09/01/16 2119     (approximate)  I have reviewed the triage vital signs and the nursing notes.   HISTORY  Chief Complaint Diarrhea    HPI [name] is a 76 y.o. female patient presents from home with chief complaint of noticing blood in her urine associated with generalized fatigue and diarrhea over the past several days. She denies any shortness of breath or chest pain. Patient is concerned because she was supposed to have outpatient blood work done on her this week and no one came to draw it. Patient denies any abdominal pain. No nausea or vomiting. She denies any chest pains. She does endorse generalized malaise. No fevers or chills.   Past Medical History:  Diagnosis Date  . Acute dermatitis   . Asthma   . COPD (chronic obstructive pulmonary disease) ([place])   . Depression   . GERD (gastroesophageal reflux disease)   . Hyperlipidemia   . Hypertension   . Hypothyroidism   . Incontinence   . Morbid obesity ([place])   . Osteoarthritis   . Osteoarthritis   . PMB (postmenopausal bleeding)   . Sleep apnea   . Vaginal atrophy   . Vaginal Pap smear, abnormal    Family History  Problem Relation Age of Onset  . Alzheimer's disease Mother   . Leukemia Mother   . Brain cancer Brother   . Heart disease Neg Hx   . Diabetes Neg Hx   . Breast cancer Neg Hx   . Ovarian cancer Neg Hx    Past Surgical History:  Procedure Laterality Date  . ABDOMINAL HYSTERECTOMY  2014   [facility]  . CHOLECYSTECTOMY    . DILATION AND CURETTAGE OF UTERUS    . UMBILICAL HERNIA REPAIR     Patient Active Problem List   Diagnosis Date Noted  . Community acquired pneumonia 05/26/2016  . Pneumonia 05/26/2016  . Pressure injury of skin 05/26/2016  . Pressure ulcer 03/24/2015  . Cellulitis 03/23/2015  . Vaginal atrophy 08/19/2014  . Asthma, chronic 04/18/2012  . Obstructive sleep  apnea 04/18/2012  . Essential hypertension, benign 04/18/2012  . Hypercholesterolemia 04/18/2012  . Aortic stenosis 04/18/2012  . Endometrial cancer ([place]) 04/18/2012  . Diverticulosis of colon without hemorrhage 04/18/2012  . Unspecified hypothyroidism 04/18/2012  . Overactive bladder 04/18/2012      Prior to Admission medications   Medication Sig Start Date End Date Taking? Authorizing Provider  acetaminophen (TYLENOL) 500 MG tablet Take 500 mg by mouth every 6 (six) hours as needed.    [provider]  ADVAIR DISKUS 250-50 MCG/DOSE AEPB Inhale 1 puff into the lungs 2 (two) times daily. 05/09/16   [provider]  albuterol (PROVENTIL HFA;VENTOLIN HFA) 108 (90 BASE) MCG/ACT inhaler Inhale 2 puffs into the lungs every 6 (six) hours as needed for wheezing or shortness of breath.     [provider]  aspirin 81 MG chewable tablet Chew 81 mg by mouth daily.    [provider]  atorvastatin (LIPITOR) 20 MG tablet Take 20 mg by mouth at bedtime.     [provider]  benzonatate (TESSALON) 100 MG capsule Take 1 capsule (100 mg total) by mouth 3 (three) times daily. 05/30/16   Vaughan Basta, MD  Calcium Carbonate-Vitamin D (CALCIUM 600+D) 600-400 MG-UNIT tablet Take 1 tablet by mouth daily after lunch.     [provider]  docusate  sodium (COLACE) 100 MG capsule Take 1 capsule (100 mg total) by mouth 2 (two) times daily as needed for mild constipation. 05/30/16   Vaughan Basta, MD  furosemide (LASIX) 20 MG tablet Take 1 tablet (20 mg total) by mouth daily. 03/26/15   Nicholes Mango, MD  hydrocortisone cream 1 % Apply 1 application topically daily.    [provider]  levothyroxine (SYNTHROID, LEVOTHROID) 125 MCG tablet Take 125 mcg by mouth daily before breakfast.    [provider]  Misc Natural Products (OSTEO BI-FLEX ADV TRIPLE ST) TABS Take 1 tablet by mouth daily.    [provider]  nystatin  (MYCOSTATIN/NYSTOP) powder Apply 1 g topically daily as needed.    [provider]  Polyvinyl Alcohol-Povidone (REFRESH OP) Apply 1 drop to eye as needed (for dry eyes).    [provider]  potassium chloride SA (K-DUR,KLOR-CON) 20 MEQ tablet Take 1 tablet (20 mEq total) by mouth daily. 09/23/12   Einar Pheasant, MD  Turmeric 500 MG CAPS Take 500 mg by mouth daily.    [provider]    Allergies Morphine and related and Vicodin [hydrocodone-acetaminophen]    Social History Social History  Substance Use Topics  . Smoking status: Never Smoker  . Smokeless tobacco: Never Used  . Alcohol use No    Review of Systems Patient denies headaches, rhinorrhea, blurry vision, numbness, shortness of breath, chest pain, edema, cough, abdominal pain, nausea, vomiting, diarrhea, dysuria, fevers, rashes or hallucinations unless otherwise stated above in HPI. ____________________________________________   PHYSICAL EXAM:  VITAL SIGNS: Vitals:   09/01/16 2118 09/01/16 2351  BP: (!) 153/81 139/71  Pulse: 73 67  Resp: (!) 21 16  Temp: 98.8 F (37.1 C)     Constitutional: Alert and oriented. Super morbid obesity, non toxic appearing. Eyes: Conjunctivae are normal.  Head: Atraumatic. Nose: No congestion/rhinnorhea. Mouth/Throat: Mucous membranes are moist.   Neck: No stridor. Painless ROM.  Cardiovascular: Normal rate, regular rhythm. Grossly normal heart sounds.  Good peripheral circulation. Respiratory: Normal respiratory effort.  No retractions. Lungs CTAB. Gastrointestinal: Soft and nontender. No distention. No abdominal bruits. No CVA tenderness. Musculoskeletal: No lower extremity tenderness, 3+ BLE edema  No joint effusions. Neurologic:  Normal speech and language. No gross focal neurologic deficits are appreciated. No facial droop Skin:  Skin is warm, dry and intact. No rash noted. Psychiatric: Mood and affect are normal. Speech and behavior are  normal. ____________________________________________   LABS (all labs ordered are listed, but only abnormal results are displayed)  Results for orders placed or performed during the hospital encounter of 09/01/16 (from the past 24 hour(s))  Troponin I     Status: None   Collection Time: 09/01/16  9:20 PM  Result Value Ref Range   Troponin I <0.03 <0.03 ng/mL  Comprehensive metabolic panel     Status: Abnormal   Collection Time: 09/01/16  9:20 PM  Result Value Ref Range   Sodium 136 135 - 145 mmol/L   Potassium 4.2 3.5 - 5.1 mmol/L   Chloride 101 101 - 111 mmol/L   CO2 28 22 - 32 mmol/L   Glucose, Bld 130 (H) 65 - 99 mg/dL   BUN 19 6 - 20 mg/dL   Creatinine, Ser 1.05 (H) 0.44 - 1.00 mg/dL   Calcium 9.5 8.9 - 10.3 mg/dL   Total Protein 7.5 6.5 - 8.1 g/dL   Albumin 4.0 3.5 - 5.0 g/dL   AST 28 15 - 41 U/L   ALT 23  14 - 54 U/L   Alkaline Phosphatase 64 38 - 126 U/L   Total Bilirubin 0.7 0.3 - 1.2 mg/dL   GFR calc non Af Amer 51 (L) >60 mL/min   GFR calc Af Amer 59 (L) >60 mL/min   Anion gap 7 5 - 15  Urinalysis, Complete w Microscopic     Status: Abnormal   Collection Time: 09/01/16 11:50 PM  Result Value Ref Range   Color, Urine STRAW (A) YELLOW   APPearance CLEAR (A) CLEAR   Specific Gravity, Urine 1.004 (L) 1.005 - 1.030   pH 6.0 5.0 - 8.0   Glucose, UA NEGATIVE NEGATIVE mg/dL   Hgb urine dipstick SMALL (A) NEGATIVE   Bilirubin Urine NEGATIVE NEGATIVE   Ketones, ur NEGATIVE NEGATIVE mg/dL   Protein, ur NEGATIVE NEGATIVE mg/dL   Nitrite NEGATIVE NEGATIVE   Leukocytes, UA NEGATIVE NEGATIVE   RBC / HPF NONE SEEN 0 - 5 RBC/hpf   WBC, UA 0-5 0 - 5 WBC/hpf   Bacteria, UA NONE SEEN NONE SEEN   Squamous Epithelial / LPF 0-5 (A) NONE SEEN   Mucous PRESENT    ____________________________________________  EKG My review and personal interpretation at Time: 21:19   Indication: weakness  Rate: 75  Rhythm: sinus Axis: normal  Other: normal intervals, no  stemi ____________________________________________  RADIOLOGY  I personally reviewed all radiographic images ordered to evaluate for the above acute complaints and reviewed radiology reports and findings.  These findings were personally discussed with the patient.  Please see medical record for radiology report.  ____________________________________________   PROCEDURES  Procedure(s) performed:  Procedures    Critical Care performed: no ____________________________________________   INITIAL IMPRESSION / ASSESSMENT AND PLAN / ED COURSE  Pertinent labs & imaging results that were available during my care of the patient were reviewed by me and considered in my medical decision making (see chart for details).  DDX: uti, urethritis, dehydration, chf, electrolye abn  Clancy Leiner is a 76 y.o. who presents to the ED with complaint of diarrhea fatigue and spotting in her urine. Patient denies any flank pain. She is morbidly obese but appears comfortable and in no acute distress. She has no respiratory complaints and clinically does not appear to be in decompensated CHF. She denies any chest pains. Denies any vaginal bleeding. No blood in her stool. She is tolerating oral hydration. Blood work ordered to evaluate for acute abnormalities is reassuring. At this point we still have not been able to obtain urinalysis therefore patient will be signed out to Dr. Karma Greaser pending urinalysis and reassessment. I do feel the patient will be appropriate for outpatient follow-up.     ____________________________________________   FINAL CLINICAL IMPRESSION(S) / ED DIAGNOSES  Final diagnoses:  Weakness      NEW MEDICATIONS STARTED DURING THIS VISIT:  New Prescriptions   No medications on file     Note:  This document was prepared using Dragon voice recognition software and may include unintentional dictation errors.    Merlyn Lot, MD 09/02/16 [phone]

## 2016-09-01 NOTE — ED Triage Notes (Signed)
Pt comes into the ED via EMS from home c/o dirrhea, fatigue, and blood in her urine.  Patient denies any chest pain, shortness of breath, or dizziness.  Patient states she saw the blood when wiping and it was bright red and minimal.  Patient states she has a h/o of UTI's.  Patient explained she has had intermittent diarrhea x4 days.

## 2016-09-01 NOTE — ED Notes (Signed)
ED Provider at bedside. 

## 2016-09-02 LAB — URINALYSIS, COMPLETE (UACMP) WITH MICROSCOPIC
Bacteria, UA: NONE SEEN
Bilirubin Urine: NEGATIVE
Glucose, UA: NEGATIVE mg/dL
Ketones, ur: NEGATIVE mg/dL
Leukocytes, UA: NEGATIVE
Nitrite: NEGATIVE
Protein, ur: NEGATIVE mg/dL
RBC / HPF: NONE SEEN RBC/hpf (ref 0–5)
Specific Gravity, Urine: 1.004 — ABNORMAL LOW (ref 1.005–1.030)
pH: 6 (ref 5.0–8.0)

## 2016-09-02 LAB — DIFFERENTIAL
Basophils Absolute: 0.1 10*3/uL (ref 0–0.1)
Basophils Relative: 1 %
Eosinophils Absolute: 0.1 10*3/uL (ref 0–0.7)
Eosinophils Relative: 1 %
Lymphocytes Relative: 15 %
Lymphs Abs: 1.5 10*3/uL (ref 1.0–3.6)
Monocytes Absolute: 0.7 10*3/uL (ref 0.2–0.9)
Monocytes Relative: 6 %
Neutro Abs: 8 10*3/uL — ABNORMAL HIGH (ref 1.4–6.5)
Neutrophils Relative %: 77 %

## 2016-09-02 LAB — CBC
HCT: 40.6 % (ref 35.0–47.0)
Hemoglobin: 14.3 g/dL (ref 12.0–16.0)
MCH: 31.9 pg (ref 26.0–34.0)
MCHC: 35.2 g/dL (ref 32.0–36.0)
MCV: 90.8 fL (ref 80.0–100.0)
Platelets: 255 10*3/uL (ref 150–440)
RBC: 4.47 MIL/uL (ref 3.80–5.20)
RDW: 13.3 % (ref 11.5–14.5)
WBC: 10.4 10*3/uL (ref 3.6–11.0)

## 2016-09-02 LAB — BRAIN NATRIURETIC PEPTIDE: B Natriuretic Peptide: 71 pg/mL (ref 0.0–100.0)

## 2016-09-02 NOTE — Discharge Instructions (Signed)
As we discussed, your workup today was reassuring.  Though we do not know exactly what is causing your symptoms, it appears that you have no emergent medical condition at this time and that you are safe to go home and follow up as recommended in this paperwork. ° °Please return immediately to the Emergency Department if you develop any new or worsening symptoms that concern you. ° °

## 2016-09-02 NOTE — ED Provider Notes (Signed)
Clinical Course as of Sep 03 238  Sat Sep 02, 2016  0003 Assuming care from Dr. Quentin Cornwall.  In short, [name] is a 76 y.o. female with a chief complaint of diarrhea, hematuria/dysuria, and general malaise.  Refer to the original H&P for additional details.  The current plan of care is to follow up UA, BNP, CBC, and reassess.  Anticipate discharge with outpatient follow up.   [CF]  [phone] Results all reassuring with no evidence of acute or emergent process.  Discussed results with patient who is comfortable and has been sleeping during the wait.    I gave my usual and customary return precautions.     [CF]    Clinical Course User Index [CF] Hinda Kehr, MD      Hinda Kehr, MD 09/02/16 [phone]

## 2017-01-28 ENCOUNTER — Emergency Department
Admission: EM | Admit: 2017-01-28 | Discharge: 2017-01-28 | Disposition: A | Discharge status: Home / Self Care | Payer: Medicare HMO | Attending: Student in an Organized Health Care Education/Training Program | Admitting: Student in an Organized Health Care Education/Training Program

## 2017-01-28 ENCOUNTER — Other Ambulatory Visit: Payer: Self-pay

## 2017-01-28 ENCOUNTER — Encounter: Payer: Self-pay | Admitting: Emergency Medicine

## 2017-01-28 DIAGNOSIS — Z7982 Long term (current) use of aspirin: Secondary | ICD-10-CM | POA: Insufficient documentation

## 2017-01-28 DIAGNOSIS — M545 Low back pain, unspecified: Secondary | ICD-10-CM

## 2017-01-28 DIAGNOSIS — J449 Chronic obstructive pulmonary disease, unspecified: Secondary | ICD-10-CM | POA: Diagnosis not present

## 2017-01-28 DIAGNOSIS — L03116 Cellulitis of left lower limb: Secondary | ICD-10-CM | POA: Insufficient documentation

## 2017-01-28 DIAGNOSIS — I10 Essential (primary) hypertension: Secondary | ICD-10-CM | POA: Diagnosis not present

## 2017-01-28 DIAGNOSIS — E039 Hypothyroidism, unspecified: Secondary | ICD-10-CM | POA: Diagnosis not present

## 2017-01-28 DIAGNOSIS — J45909 Unspecified asthma, uncomplicated: Secondary | ICD-10-CM | POA: Insufficient documentation

## 2017-01-28 DIAGNOSIS — Z79899 Other long term (current) drug therapy: Secondary | ICD-10-CM | POA: Insufficient documentation

## 2017-01-28 MED ORDER — CLOTRIMAZOLE 1 % EX SOLN: 1.0000 "application " | Freq: Two times a day (BID) | CUTANEOUS | 0 refills | Status: DC

## 2017-01-28 MED ORDER — TRAMADOL HCL 50 MG PO TABS: 50.0000 mg | ORAL_TABLET | Freq: Four times a day (QID) | ORAL | 0 refills | Status: AC | PRN

## 2017-01-28 MED ORDER — CYCLOBENZAPRINE HCL 10 MG PO TABS: 10.0000 mg | ORAL_TABLET | Freq: Three times a day (TID) | ORAL | 0 refills | Status: DC | PRN

## 2017-01-28 MED ORDER — CYCLOBENZAPRINE HCL 10 MG PO TABS
5.0000 mg | ORAL_TABLET | Freq: Once | ORAL | Status: AC
  Administered 2017-01-28: 5 mg via ORAL
  Filled 2017-01-28: qty 1

## 2017-01-28 MED ORDER — TRAMADOL HCL 50 MG PO TABS
50.0000 mg | ORAL_TABLET | Freq: Once | ORAL | Status: AC
  Administered 2017-01-28: 50 mg via ORAL
  Filled 2017-01-28: qty 1

## 2017-01-28 MED ORDER — CEPHALEXIN 500 MG PO CAPS: 500.0000 mg | ORAL_CAPSULE | Freq: Three times a day (TID) | ORAL | 0 refills | Status: AC

## 2017-01-28 NOTE — ED Provider Notes (Signed)
[facility] Emergency Department Provider Note    First MD Initiated Contact with Patient 01/28/17 1549     (approximate)  I have reviewed the triage vital signs and the nursing notes.   HISTORY  Chief Complaint Back Pain and Cellulitis    HPI [name] is a 76 y.o. female presents from home  with chief complaint of acute mild to moderate right low back pain for the past several days worse with motion as well as pain and rash to left lower extremity.  Describes the pain as aching and burning sensation in the right lower extremity and intermittently shoot down the right side of her leg but no associated numbness or tingling.  No dysuria.  States that she was also being cared for by her son this morning who states he noted some blood in her stool.  Does have a history of hemorrhoids.  States she also would like to be evaluated for pain in redness to her left ankle that is been developing over the past several days.  She is been putting Neosporin on the rash.   Past Medical History:  Diagnosis Date  . Acute dermatitis   . Asthma   . COPD (chronic obstructive pulmonary disease) ([place])   . Depression   . GERD (gastroesophageal reflux disease)   . Hyperlipidemia   . Hypertension   . Hypothyroidism   . Incontinence   . Morbid obesity ([place])   . Osteoarthritis   . Osteoarthritis   . PMB (postmenopausal bleeding)   . Sleep apnea   . Vaginal atrophy   . Vaginal Pap smear, abnormal    Family History  Problem Relation Age of Onset  . Alzheimer's disease Mother   . Leukemia Mother   . Brain cancer Brother   . Heart disease Neg Hx   . Diabetes Neg Hx   . Breast cancer Neg Hx   . Ovarian cancer Neg Hx    Past Surgical History:  Procedure Laterality Date  . ABDOMINAL HYSTERECTOMY  2014   [facility]  . CHOLECYSTECTOMY    . DILATION AND CURETTAGE OF UTERUS    . UMBILICAL HERNIA REPAIR     Patient Active Problem List   Diagnosis Date Noted  . Community  acquired pneumonia 05/26/2016  . Pneumonia 05/26/2016  . Pressure injury of skin 05/26/2016  . Pressure ulcer 03/24/2015  . Cellulitis 03/23/2015  . Vaginal atrophy 08/19/2014  . Asthma, chronic 04/18/2012  . Obstructive sleep apnea 04/18/2012  . Essential hypertension, benign 04/18/2012  . Hypercholesterolemia 04/18/2012  . Aortic stenosis 04/18/2012  . Endometrial cancer ([place]) 04/18/2012  . Diverticulosis of colon without hemorrhage 04/18/2012  . Unspecified hypothyroidism 04/18/2012  . Overactive bladder 04/18/2012      Prior to Admission medications   Medication Sig Start Date End Date Taking? Authorizing Provider  acetaminophen (TYLENOL) 500 MG tablet Take 500 mg by mouth every 6 (six) hours as needed.    [provider]  ADVAIR DISKUS 250-50 MCG/DOSE AEPB Inhale 1 puff into the lungs 2 (two) times daily. 05/09/16   [provider]  albuterol (PROVENTIL HFA;VENTOLIN HFA) 108 (90 BASE) MCG/ACT inhaler Inhale 2 puffs into the lungs every 6 (six) hours as needed for wheezing or shortness of breath.     [provider]  aspirin 81 MG chewable tablet Chew 81 mg by mouth daily.    [provider]  atorvastatin (LIPITOR) 20 MG tablet Take 20 mg by mouth at bedtime.  [provider]  benzonatate (TESSALON) 100 MG capsule Take 1 capsule (100 mg total) by mouth 3 (three) times daily. 05/30/16   Vaughan Basta, MD  Calcium Carbonate-Vitamin D (CALCIUM 600+D) 600-400 MG-UNIT tablet Take 1 tablet by mouth daily after lunch.     [provider]  docusate sodium (COLACE) 100 MG capsule Take 1 capsule (100 mg total) by mouth 2 (two) times daily as needed for mild constipation. 05/30/16   Vaughan Basta, MD  furosemide (LASIX) 20 MG tablet Take 1 tablet (20 mg total) by mouth daily. 03/26/15   Nicholes Mango, MD  hydrocortisone cream 1 % Apply 1 application topically daily.    [provider]  levothyroxine (SYNTHROID,  LEVOTHROID) 125 MCG tablet Take 125 mcg by mouth daily before breakfast.    [provider]  Misc Natural Products (OSTEO BI-FLEX ADV TRIPLE ST) TABS Take 1 tablet by mouth daily.    [provider]  nystatin (MYCOSTATIN/NYSTOP) powder Apply 1 g topically daily as needed.    [provider]  Polyvinyl Alcohol-Povidone (REFRESH OP) Apply 1 drop to eye as needed (for dry eyes).    [provider]  potassium chloride SA (K-DUR,KLOR-CON) 20 MEQ tablet Take 1 tablet (20 mEq total) by mouth daily. 09/23/12   Einar Pheasant, MD  Turmeric 500 MG CAPS Take 500 mg by mouth daily.    [provider]    Allergies Morphine and related and Vicodin [hydrocodone-acetaminophen]    Social History Social History   Tobacco Use  . Smoking status: Never Smoker  . Smokeless tobacco: Never Used  Substance Use Topics  . Alcohol use: No  . Drug use: No    Review of Systems Patient denies headaches, rhinorrhea, blurry vision, numbness, shortness of breath, chest pain, edema, cough, abdominal pain, nausea, vomiting, diarrhea, dysuria, fevers, rashes or hallucinations unless otherwise stated above in HPI. ____________________________________________   PHYSICAL EXAM:  VITAL SIGNS: Vitals:   01/28/17 1555  BP: (!) 122/51  Pulse: 78  Resp: 18  Temp: 98.1 F (36.7 C)  SpO2: 97%    Constitutional: Alert and oriented.  Super morbidly obese, in no acute distress. Eyes: Conjunctivae are normal.  Head: Atraumatic. Nose: No congestion/rhinnorhea. Mouth/Throat: Mucous membranes are moist.   Neck: No stridor. Painless ROM.  Cardiovascular: Normal rate, regular rhythm. Grossly normal heart sounds.  Good peripheral circulation. Respiratory: Normal respiratory effort.  No retractions. Lungs CTAB. Gastrointestinal: Soft and nontender. No distention. No abdominal bruits. No CVA tenderness. Genitourinary: Normal external genitalia.  No cellulitis.  No abscess.  Small  nonthrombosed hemorrhoid.  No blood on exam. Musculoskeletal:  No joint effusions.  Bilateral lower extremity edema with erythema to left ankle with what appears to be a yeast infection fold of edematous tissue with some satellite erythema consistent with candidiasis. Neurologic:  Normal speech and language. No gross focal neurologic deficits are appreciated. No facial droop Skin:  Skin is warm, dry and intact. No rash noted. Psychiatric: Mood and affect are normal. Speech and behavior are normal.  ____________________________________________   LABS (all labs ordered are listed, but only abnormal results are displayed)  No results found for this or any previous visit (from the past 24 hour(s)). ________________________________________________________________________________________  [id number]   ____________________________________________   PROCEDURES  Procedure(s) performed:  Procedures    Critical Care performed: no ____________________________________________   INITIAL IMPRESSION / ASSESSMENT AND PLAN / ED COURSE  Pertinent labs & imaging results that were available during my care of the patient were reviewed  by me and considered in my medical decision making (see chart for details).  DDX: Muscular skeletal strain, lumbago, cellulitis, for nares, UTI, perirectal abscess, hemorrhoid  [name] is a 76 y.o. who presents to the ED with cellulitis and candidal infection of the left lower extremity.  Good distal perfusion.  Will start on Keflex as well as topical antifungal.  Rectal exam and back exam consistent with musculoskeletal strain trauma to suggest fracture.  No evidence of perirectal abscess deep space infection or abscess.  No evidence of active bleeding.  At this point I do believe patient is stable and appropriate for follow-up with her PCP tomorrow.  Have placed a case management consultation for home health aid.  Have discussed with the patient and  available family all diagnostics and treatments performed thus far and all questions were answered to the best of my ability. The patient demonstrates understanding and agreement with plan.       ____________________________________________   FINAL CLINICAL IMPRESSION(S) / ED DIAGNOSES  Final diagnoses:  Acute right-sided low back pain without sciatica  Cellulitis of left leg      NEW MEDICATIONS STARTED DURING THIS VISIT:  This SmartLink is deprecated. Use AVSMEDLIST instead to display the medication list for a patient.   Note:  This document was prepared using Dragon voice recognition software and may include unintentional dictation errors.    Merlyn Lot, MD 01/28/17 [phone]

## 2017-01-28 NOTE — Discharge Instructions (Signed)

## 2017-01-28 NOTE — ED Triage Notes (Signed)
Pt arrived via EMS from home for reports of right side low back pain for several days. Pt also reports cellulitis to left ankle. EMS reports VSS. Pt denies NVD or dysuria. Pt presents with redness to folds of tissue surrounding right ankle.

## 2017-01-28 NOTE — ED Notes (Signed)
Pt verbalized understanding of discharge instructions and denied questions. 

## 2017-11-27 ENCOUNTER — Emergency Department
Admission: EM | Admit: 2017-11-27 | Discharge: 2017-11-27 | Disposition: A | Discharge status: Home / Self Care | Payer: Medicare HMO | Attending: Emergency Medicine | Admitting: Emergency Medicine

## 2017-11-27 DIAGNOSIS — C541 Malignant neoplasm of endometrium: Secondary | ICD-10-CM | POA: Diagnosis not present

## 2017-11-27 DIAGNOSIS — E039 Hypothyroidism, unspecified: Secondary | ICD-10-CM | POA: Diagnosis not present

## 2017-11-27 DIAGNOSIS — Z7982 Long term (current) use of aspirin: Secondary | ICD-10-CM | POA: Diagnosis not present

## 2017-11-27 DIAGNOSIS — I890 Lymphedema, not elsewhere classified: Secondary | ICD-10-CM | POA: Insufficient documentation

## 2017-11-27 DIAGNOSIS — Z79899 Other long term (current) drug therapy: Secondary | ICD-10-CM | POA: Diagnosis not present

## 2017-11-27 DIAGNOSIS — R197 Diarrhea, unspecified: Secondary | ICD-10-CM | POA: Insufficient documentation

## 2017-11-27 DIAGNOSIS — J449 Chronic obstructive pulmonary disease, unspecified: Secondary | ICD-10-CM | POA: Diagnosis not present

## 2017-11-27 DIAGNOSIS — I10 Essential (primary) hypertension: Secondary | ICD-10-CM | POA: Insufficient documentation

## 2017-11-27 LAB — CBC WITH DIFFERENTIAL/PLATELET
Basophils Absolute: 0.1 10*3/uL (ref 0–0.1)
Basophils Relative: 1 %
Eosinophils Absolute: 0.1 10*3/uL (ref 0–0.7)
Eosinophils Relative: 2 %
HCT: 40.8 % (ref 35.0–47.0)
Hemoglobin: 14.2 g/dL (ref 12.0–16.0)
Lymphocytes Relative: 12 %
Lymphs Abs: 1 10*3/uL (ref 1.0–3.6)
MCH: 32.3 pg (ref 26.0–34.0)
MCHC: 34.9 g/dL (ref 32.0–36.0)
MCV: 92.6 fL (ref 80.0–100.0)
Monocytes Absolute: 0.8 10*3/uL (ref 0.2–0.9)
Monocytes Relative: 9 %
Neutro Abs: 6.1 10*3/uL (ref 1.4–6.5)
Neutrophils Relative %: 76 %
Platelets: 237 10*3/uL (ref 150–440)
RBC: 4.4 MIL/uL (ref 3.80–5.20)
RDW: 13.4 % (ref 11.5–14.5)
WBC: 8.1 10*3/uL (ref 3.6–11.0)

## 2017-11-27 LAB — COMPREHENSIVE METABOLIC PANEL
ALT: 25 U/L (ref 0–44)
AST: 27 U/L (ref 15–41)
Albumin: 3.9 g/dL (ref 3.5–5.0)
Alkaline Phosphatase: 51 U/L (ref 38–126)
Anion gap: 6 (ref 5–15)
BUN: 14 mg/dL (ref 8–23)
CO2: 31 mmol/L (ref 22–32)
Calcium: 9.3 mg/dL (ref 8.9–10.3)
Chloride: 100 mmol/L (ref 98–111)
Creatinine, Ser: 1.29 mg/dL — ABNORMAL HIGH (ref 0.44–1.00)
GFR calc Af Amer: 45 mL/min — ABNORMAL LOW (ref >60)
GFR calc non Af Amer: 39 mL/min — ABNORMAL LOW (ref >60)
Glucose, Bld: 116 mg/dL — ABNORMAL HIGH (ref 70–99)
Potassium: 3.8 mmol/L (ref 3.5–5.1)
Sodium: 137 mmol/L (ref 135–145)
Total Bilirubin: 0.7 mg/dL (ref 0.3–1.2)
Total Protein: 7 g/dL (ref 6.5–8.1)

## 2017-11-27 LAB — LIPASE, BLOOD: Lipase: 29 U/L (ref 11–51)

## 2017-11-27 NOTE — ED Triage Notes (Addendum)
Pt BIB [facility] from home for back pain x 1 month and diarrhea x 1 day. Pt denies blood in diarrhea & abdominal pain. Pt took ?"kayopeptate" and this has relieved her diarrhea. Pt states she believes it was something she ate. Pt currently denies back pain. Pt denies recent antibiotics. Pt denies N/V/chest pain/SOB. Pt is obese but appears well. ABCs intact. NAD.

## 2017-11-27 NOTE — ED Notes (Signed)
Pt taken home via stretcher with [facility]. At time of leaving ED, pt alert & oriented x4. ABCs intact. VSS. NAD.

## 2017-11-27 NOTE — Discharge Instructions (Addendum)
Please call your health insurance company to inquire about medical transport services and social workers who can help you with your primary care needs.

## 2017-11-27 NOTE — ED Provider Notes (Signed)
[facility] Emergency Department Provider Note  ____________________________________________  Time seen: Approximately 7:50 PM  I have reviewed the triage vital signs and the nursing notes.   HISTORY  Chief Complaint Back Pain and Diarrhea    HPI [name] is a 77 y.o. female with a history of lymphedema hypertension hyperlipidemia morbid obesity who comes to the ED tonight to request a flu vaccination and a pneumonia vaccination.  She would also like [place] to call an ophthalmologist to come evaluate her eyes because sometimes when she wakes up in the morning they seem a little bit crusty.  She denies any acute pain fevers chills or other complaints.  No falls.  She does note that after eating a can of fruit that seemed contaminated with cloudy syrup, she has had a few loose bowel movements today.  No nausea vomiting or abdominal pain.      Past Medical History:  Diagnosis Date  . Acute dermatitis   . Asthma   . COPD (chronic obstructive pulmonary disease) ([place])   . Depression   . GERD (gastroesophageal reflux disease)   . Hyperlipidemia   . Hypertension   . Hypothyroidism   . Incontinence   . Morbid obesity ([place])   . Osteoarthritis   . Osteoarthritis   . PMB (postmenopausal bleeding)   . Sleep apnea   . Vaginal atrophy   . Vaginal Pap smear, abnormal      Patient Active Problem List   Diagnosis Date Noted  . Community acquired pneumonia 05/26/2016  . Pneumonia 05/26/2016  . Pressure injury of skin 05/26/2016  . Pressure ulcer 03/24/2015  . Cellulitis 03/23/2015  . Vaginal atrophy 08/19/2014  . Asthma, chronic 04/18/2012  . Obstructive sleep apnea 04/18/2012  . Essential hypertension, benign 04/18/2012  . Hypercholesterolemia 04/18/2012  . Aortic stenosis 04/18/2012  . Endometrial cancer ([place]) 04/18/2012  . Diverticulosis of colon without hemorrhage 04/18/2012  . Unspecified hypothyroidism 04/18/2012  . Overactive bladder  04/18/2012     Past Surgical History:  Procedure Laterality Date  . ABDOMINAL HYSTERECTOMY  2014   [facility]  . CHOLECYSTECTOMY    . DILATION AND CURETTAGE OF UTERUS    . UMBILICAL HERNIA REPAIR       Prior to Admission medications   Medication Sig Start Date End Date Taking? Authorizing Provider  acetaminophen (TYLENOL) 500 MG tablet Take 500 mg by mouth every 6 (six) hours as needed.    [provider]  ADVAIR DISKUS 250-50 MCG/DOSE AEPB Inhale 1 puff into the lungs 2 (two) times daily. 05/09/16   [provider]  albuterol (PROVENTIL HFA;VENTOLIN HFA) 108 (90 BASE) MCG/ACT inhaler Inhale 2 puffs into the lungs every 6 (six) hours as needed for wheezing or shortness of breath.     [provider]  aspirin 81 MG chewable tablet Chew 81 mg by mouth daily.    [provider]  atorvastatin (LIPITOR) 20 MG tablet Take 20 mg by mouth at bedtime.     [provider]  benzonatate (TESSALON) 100 MG capsule Take 1 capsule (100 mg total) by mouth 3 (three) times daily. 05/30/16   Vaughan Basta, MD  Calcium Carbonate-Vitamin D (CALCIUM 600+D) 600-400 MG-UNIT tablet Take 1 tablet by mouth daily after lunch.     [provider]  clotrimazole (LOTRIMIN) 1 % external solution Apply 1 application topically 2 (two) times daily. 01/28/17   Merlyn Lot, MD  cyclobenzaprine (FLEXERIL) 10 MG tablet Take 1 tablet (10 mg total) by mouth  3 (three) times daily as needed for muscle spasms. 01/28/17   Merlyn Lot, MD  docusate sodium (COLACE) 100 MG capsule Take 1 capsule (100 mg total) by mouth 2 (two) times daily as needed for mild constipation. 05/30/16   Vaughan Basta, MD  furosemide (LASIX) 20 MG tablet Take 1 tablet (20 mg total) by mouth daily. 03/26/15   Nicholes Mango, MD  hydrocortisone cream 1 % Apply 1 application topically daily.    [provider]  levothyroxine (SYNTHROID, LEVOTHROID) 125 MCG tablet Take 125 mcg by mouth  daily before breakfast.    [provider]  Misc Natural Products (OSTEO BI-FLEX ADV TRIPLE ST) TABS Take 1 tablet by mouth daily.    [provider]  nystatin (MYCOSTATIN/NYSTOP) powder Apply 1 g topically daily as needed.    [provider]  Polyvinyl Alcohol-Povidone (REFRESH OP) Apply 1 drop to eye as needed (for dry eyes).    [provider]  potassium chloride SA (K-DUR,KLOR-CON) 20 MEQ tablet Take 1 tablet (20 mEq total) by mouth daily. 09/23/12   Einar Pheasant, MD  traMADol (ULTRAM) 50 MG tablet Take 1 tablet (50 mg total) by mouth every 6 (six) hours as needed. 01/28/17 01/28/18  Merlyn Lot, MD  Turmeric 500 MG CAPS Take 500 mg by mouth daily.    [provider]     Allergies Morphine and related and Vicodin [hydrocodone-acetaminophen]   Family History  Problem Relation Age of Onset  . Alzheimer's disease Mother   . Leukemia Mother   . Brain cancer Brother   . Heart disease Neg Hx   . Diabetes Neg Hx   . Breast cancer Neg Hx   . Ovarian cancer Neg Hx     Social History Social History   Tobacco Use  . Smoking status: Never Smoker  . Smokeless tobacco: Never Used  Substance Use Topics  . Alcohol use: No  . Drug use: No    Review of Systems  Constitutional:   No fever or chills.  ENT:   No sore throat. No rhinorrhea. Cardiovascular:   No chest pain or syncope. Respiratory:   No dyspnea or cough. Gastrointestinal:   Negative for abdominal pain or vomiting.  Positive diarrhea today. Musculoskeletal:   Negative for focal pain or swelling All other systems reviewed and are negative except as documented above in ROS and HPI.  ____________________________________________   PHYSICAL EXAM:  VITAL SIGNS: ED Triage Vitals  Enc Vitals Group     BP 11/27/17 1756 130/63     Pulse Rate 11/27/17 1756 81     Resp 11/27/17 1756 18     Temp 11/27/17 1756 98.3 F (36.8 C)     Temp Source 11/27/17 1756 Oral     SpO2  11/27/17 1755 96 %     Weight 11/27/17 1757 (!) 399 lb 0.5 oz (181 kg)     Height 11/27/17 1757 5\' 7"  (1.702 m)     Head Circumference --      Peak Flow --      Pain Score 11/27/17 1756 0     Pain Loc --      Pain Edu? --      Excl. in [place]? --     Vital signs reviewed, nursing assessments reviewed.   Constitutional:   Alert and oriented. Non-toxic appearance. Eyes:   Conjunctivae are normal. EOMI. PERRL. ENT      Head:   Normocephalic and atraumatic.      Nose:  No congestion/rhinnorhea.       Mouth/Throat:   MMM, no pharyngeal erythema. No peritonsillar mass.       Neck:   No meningismus. Full ROM. Hematological/Lymphatic/Immunilogical:   No cervical lymphadenopathy. Cardiovascular:   RRR. Symmetric bilateral radial and DP pulses.  No murmurs. Cap refill less than 2 seconds. Respiratory:   Normal respiratory effort without tachypnea/retractions. Breath sounds are clear and equal bilaterally. No wheezes/rales/rhonchi. Gastrointestinal:   Soft and nontender. Non distended. There is no CVA tenderness.  No rebound, rigidity, or guarding. Genitourinary:   deferred Musculoskeletal:   Normal range of motion in all extremities. No joint effusions.  No lower extremity tenderness.  Chronic lymphedema, brawny skin thickening.  No skin breakdown or evidence of S STI.  No crepitus Neurologic:   Normal speech and language.  Motor grossly intact. No acute focal neurologic deficits are appreciated.  Skin:    Skin is warm, dry and intact. No rash noted.  No petechiae, purpura, or bullae.  ____________________________________________    LABS (pertinent positives/negatives) (all labs ordered are listed, but only abnormal results are displayed) Labs Reviewed  COMPREHENSIVE METABOLIC PANEL - Abnormal; Notable for the following components:      Result Value   Glucose, Bld 116 (*)    Creatinine, Ser 1.29 (*)    GFR calc non Af Amer 39 (*)    GFR calc Af Amer 45 (*)    All other components  within normal limits  LIPASE, BLOOD  CBC WITH DIFFERENTIAL/PLATELET   ____________________________________________   EKG  Interpreted by me Sinus rhythm rate of 75, normal axis intervals QRS ST segments and T waves.  ____________________________________________    RADIOLOGY  No results found.  ____________________________________________   PROCEDURES Procedures  ____________________________________________    CLINICAL IMPRESSION / ASSESSMENT AND PLAN / ED COURSE  Pertinent labs & imaging results that were available during my care of the patient were reviewed by me and considered in my medical decision making (see chart for details).      Clinical Course as of Nov 27 1948  Tue Nov 27, 2017  1921 NAD. Diarrhea today after eating moldy can of fruit. No pain. Chronic lymphedema. Wants flu and pneumonia vaccines. C/o trouble getting doctors [place]    [PS]    Clinical Course User Index [PS] Carrie Mew, MD     ----------------------------------------- 7:52 PM on 11/27/2017 -----------------------------------------  Advised to contact her health insurance company who may be able to assist her with medical transport services and social work which will help with a lot of her problems.  No acute medical needs.  Vital signs are normal, labs are unremarkable, exam is reassuring.  ____________________________________________   FINAL CLINICAL IMPRESSION(S) / ED DIAGNOSES    Final diagnoses:  Diarrhea of presumed infectious origin  Chronic acquired lymphedema     ED Discharge Orders    None      Portions of this note were generated with dragon dictation software. Dictation errors may occur despite best attempts at proofreading.    Carrie Mew, MD 11/27/17 [phone]

## 2017-11-27 NOTE — ED Notes (Signed)
[facility] called for transport back home

## 2017-11-27 NOTE — ED Notes (Signed)
Stafford MD at bedside 

## 2019-08-22 ENCOUNTER — Inpatient Hospital Stay
Admission: EM | Admit: 2019-08-22 | Discharge: 2019-08-28 | DRG: 292 | Disposition: A | Discharge status: Hospice | Payer: Medicare HMO | Attending: Internal Medicine | Admitting: Internal Medicine

## 2019-08-22 ENCOUNTER — Other Ambulatory Visit: Payer: Self-pay

## 2019-08-22 ENCOUNTER — Emergency Department: Payer: Medicare HMO

## 2019-08-22 ENCOUNTER — Encounter: Payer: Self-pay | Admitting: Emergency Medicine

## 2019-08-22 DIAGNOSIS — Z7189 Other specified counseling: Secondary | ICD-10-CM | POA: Diagnosis not present

## 2019-08-22 DIAGNOSIS — G4733 Obstructive sleep apnea (adult) (pediatric): Secondary | ICD-10-CM | POA: Diagnosis present

## 2019-08-22 DIAGNOSIS — I350 Nonrheumatic aortic (valve) stenosis: Secondary | ICD-10-CM

## 2019-08-22 DIAGNOSIS — Z20822 Contact with and (suspected) exposure to covid-19: Secondary | ICD-10-CM | POA: Diagnosis present

## 2019-08-22 DIAGNOSIS — Z90710 Acquired absence of both cervix and uterus: Secondary | ICD-10-CM

## 2019-08-22 DIAGNOSIS — I890 Lymphedema, not elsewhere classified: Secondary | ICD-10-CM | POA: Diagnosis present

## 2019-08-22 DIAGNOSIS — Z6841 Body Mass Index (BMI) 40.0 and over, adult: Secondary | ICD-10-CM

## 2019-08-22 DIAGNOSIS — I5031 Acute diastolic (congestive) heart failure: Secondary | ICD-10-CM

## 2019-08-22 DIAGNOSIS — J449 Chronic obstructive pulmonary disease, unspecified: Secondary | ICD-10-CM | POA: Diagnosis present

## 2019-08-22 DIAGNOSIS — Z7982 Long term (current) use of aspirin: Secondary | ICD-10-CM | POA: Diagnosis not present

## 2019-08-22 DIAGNOSIS — K219 Gastro-esophageal reflux disease without esophagitis: Secondary | ICD-10-CM | POA: Diagnosis present

## 2019-08-22 DIAGNOSIS — Z7951 Long term (current) use of inhaled steroids: Secondary | ICD-10-CM

## 2019-08-22 DIAGNOSIS — Z79890 Hormone replacement therapy (postmenopausal): Secondary | ICD-10-CM

## 2019-08-22 DIAGNOSIS — Z66 Do not resuscitate: Secondary | ICD-10-CM | POA: Diagnosis present

## 2019-08-22 DIAGNOSIS — I5033 Acute on chronic diastolic (congestive) heart failure: Secondary | ICD-10-CM | POA: Diagnosis present

## 2019-08-22 DIAGNOSIS — R079 Chest pain, unspecified: Secondary | ICD-10-CM | POA: Diagnosis not present

## 2019-08-22 DIAGNOSIS — I110 Hypertensive heart disease with heart failure: Secondary | ICD-10-CM | POA: Diagnosis present

## 2019-08-22 DIAGNOSIS — E039 Hypothyroidism, unspecified: Secondary | ICD-10-CM | POA: Diagnosis present

## 2019-08-22 DIAGNOSIS — I4891 Unspecified atrial fibrillation: Secondary | ICD-10-CM

## 2019-08-22 DIAGNOSIS — E785 Hyperlipidemia, unspecified: Secondary | ICD-10-CM | POA: Diagnosis present

## 2019-08-22 DIAGNOSIS — Z79899 Other long term (current) drug therapy: Secondary | ICD-10-CM | POA: Diagnosis not present

## 2019-08-22 DIAGNOSIS — I509 Heart failure, unspecified: Secondary | ICD-10-CM

## 2019-08-22 DIAGNOSIS — Z515 Encounter for palliative care: Secondary | ICD-10-CM | POA: Diagnosis not present

## 2019-08-22 LAB — CBC
HCT: 42.7 % (ref 36.0–46.0)
Hemoglobin: 14.9 g/dL (ref 12.0–15.0)
MCH: 31.7 pg (ref 26.0–34.0)
MCHC: 34.9 g/dL (ref 30.0–36.0)
MCV: 90.9 fL (ref 80.0–100.0)
Platelets: 247 10*3/uL (ref 150–400)
RBC: 4.7 MIL/uL (ref 3.87–5.11)
RDW: 12.9 % (ref 11.5–15.5)
WBC: 11.9 10*3/uL — ABNORMAL HIGH (ref 4.0–10.5)
nRBC: 0 % (ref 0.0–0.2)

## 2019-08-22 LAB — BASIC METABOLIC PANEL
Anion gap: 11 (ref 5–15)
BUN: 12 mg/dL (ref 8–23)
CO2: 24 mmol/L (ref 22–32)
Calcium: 9.5 mg/dL (ref 8.9–10.3)
Chloride: 99 mmol/L (ref 98–111)
Creatinine, Ser: 1.05 mg/dL — ABNORMAL HIGH (ref 0.44–1.00)
GFR calc Af Amer: 59 mL/min — ABNORMAL LOW (ref >60)
GFR calc non Af Amer: 51 mL/min — ABNORMAL LOW (ref >60)
Glucose, Bld: 140 mg/dL — ABNORMAL HIGH (ref 70–99)
Potassium: 4.3 mmol/L (ref 3.5–5.1)
Sodium: 134 mmol/L — ABNORMAL LOW (ref 135–145)

## 2019-08-22 LAB — SARS CORONAVIRUS 2 BY RT PCR (HOSPITAL ORDER, PERFORMED IN ~~LOC~~ HOSPITAL LAB): SARS Coronavirus 2: NEGATIVE

## 2019-08-22 LAB — TROPONIN I (HIGH SENSITIVITY)
Troponin I (High Sensitivity): 30 ng/L — ABNORMAL HIGH (ref <18)
Troponin I (High Sensitivity): 29 ng/L — ABNORMAL HIGH (ref <18)

## 2019-08-22 LAB — TSH: TSH: 1.973 u[IU]/mL (ref 0.350–4.500)

## 2019-08-22 LAB — MAGNESIUM: Magnesium: 1.7 mg/dL (ref 1.7–2.4)

## 2019-08-22 LAB — BRAIN NATRIURETIC PEPTIDE: B Natriuretic Peptide: 490.4 pg/mL — ABNORMAL HIGH (ref 0.0–100.0)

## 2019-08-22 MED ORDER — MAGNESIUM OXIDE 400 (241.3 MG) MG PO TABS
400.0000 mg | ORAL_TABLET | Freq: Once | ORAL | Status: AC
  Administered 2019-08-23: 400 mg via ORAL
  Filled 2019-08-22: qty 1

## 2019-08-22 MED ORDER — FUROSEMIDE 10 MG/ML IJ SOLN
40.0000 mg | Freq: Two times a day (BID) | INTRAMUSCULAR | Status: DC
  Administered 2019-08-23 – 2019-08-25 (×6): 40 mg via INTRAVENOUS
  Filled 2019-08-22 (×6): qty 4

## 2019-08-22 MED ORDER — LEVOTHYROXINE SODIUM 25 MCG PO TABS
125.0000 ug | ORAL_TABLET | Freq: Every day | ORAL | Status: DC
  Administered 2019-08-24 – 2019-08-26 (×3): 125 ug via ORAL
  Filled 2019-08-22 (×3): qty 1

## 2019-08-22 MED ORDER — DOCUSATE SODIUM 100 MG PO CAPS
100.0000 mg | ORAL_CAPSULE | Freq: Two times a day (BID) | ORAL | Status: DC | PRN
  Administered 2019-08-25: 100 mg via ORAL
  Filled 2019-08-22: qty 1

## 2019-08-22 MED ORDER — ATORVASTATIN CALCIUM 20 MG PO TABS
20.0000 mg | ORAL_TABLET | Freq: Every day | ORAL | Status: DC
  Administered 2019-08-22 – 2019-08-27 (×6): 20 mg via ORAL
  Filled 2019-08-22 (×6): qty 1

## 2019-08-22 MED ORDER — SODIUM CHLORIDE 0.9 % IV SOLN: 250.0000 mL | INTRAVENOUS | Status: DC | PRN

## 2019-08-22 MED ORDER — SODIUM CHLORIDE 0.9% FLUSH: 3.0000 mL | INTRAVENOUS | Status: DC | PRN

## 2019-08-22 MED ORDER — TURMERIC 500 MG PO CAPS: 500.0000 mg | ORAL_CAPSULE | Freq: Every day | ORAL | Status: DC

## 2019-08-22 MED ORDER — APIXABAN 5 MG PO TABS
5.0000 mg | ORAL_TABLET | Freq: Two times a day (BID) | ORAL | Status: DC
  Administered 2019-08-22 – 2019-08-25 (×6): 5 mg via ORAL
  Filled 2019-08-22 (×6): qty 1

## 2019-08-22 MED ORDER — CALCIUM CARBONATE-VITAMIN D 500-200 MG-UNIT PO TABS
1.0000 | ORAL_TABLET | Freq: Every day | ORAL | Status: DC
  Administered 2019-08-23 – 2019-08-28 (×5): 1 via ORAL
  Filled 2019-08-22 (×6): qty 1

## 2019-08-22 MED ORDER — POTASSIUM CHLORIDE CRYS ER 20 MEQ PO TBCR: 20.0000 meq | EXTENDED_RELEASE_TABLET | Freq: Every day | ORAL | Status: DC

## 2019-08-22 MED ORDER — SODIUM CHLORIDE 0.9% FLUSH
3.0000 mL | Freq: Two times a day (BID) | INTRAVENOUS | Status: DC
  Administered 2019-08-22 – 2019-08-27 (×9): 3 mL via INTRAVENOUS

## 2019-08-22 MED ORDER — FUROSEMIDE 10 MG/ML IJ SOLN
40.0000 mg | Freq: Once | INTRAMUSCULAR | Status: AC
  Administered 2019-08-22: 40 mg via INTRAVENOUS
  Filled 2019-08-22: qty 4

## 2019-08-22 MED ORDER — CLOTRIMAZOLE 1 % EX CREA
1.0000 "application " | TOPICAL_CREAM | Freq: Two times a day (BID) | CUTANEOUS | Status: DC
  Administered 2019-08-23 – 2019-08-28 (×9): 1 via TOPICAL
  Filled 2019-08-22 (×2): qty 15

## 2019-08-22 MED ORDER — IPRATROPIUM-ALBUTEROL 0.5-2.5 (3) MG/3ML IN SOLN
3.0000 mL | Freq: Once | RESPIRATORY_TRACT | Status: AC
  Administered 2019-08-22: 3 mL via RESPIRATORY_TRACT
  Filled 2019-08-22: qty 3

## 2019-08-22 MED ORDER — BENZONATATE 100 MG PO CAPS
100.0000 mg | ORAL_CAPSULE | Freq: Three times a day (TID) | ORAL | Status: DC
  Administered 2019-08-22 – 2019-08-28 (×17): 100 mg via ORAL
  Filled 2019-08-22 (×17): qty 1

## 2019-08-22 MED ORDER — CYCLOBENZAPRINE HCL 10 MG PO TABS
10.0000 mg | ORAL_TABLET | Freq: Three times a day (TID) | ORAL | Status: DC | PRN
  Administered 2019-08-24: 10 mg via ORAL
  Filled 2019-08-22: qty 1

## 2019-08-22 MED ORDER — IPRATROPIUM-ALBUTEROL 0.5-2.5 (3) MG/3ML IN SOLN
3.0000 mL | Freq: Four times a day (QID) | RESPIRATORY_TRACT | Status: DC
  Administered 2019-08-23 – 2019-08-28 (×19): 3 mL via RESPIRATORY_TRACT
  Filled 2019-08-22 (×22): qty 3

## 2019-08-22 MED ORDER — ACETAMINOPHEN 325 MG PO TABS
650.0000 mg | ORAL_TABLET | ORAL | Status: DC | PRN
  Administered 2019-08-24: 650 mg via ORAL
  Filled 2019-08-22: qty 2

## 2019-08-22 MED ORDER — ENOXAPARIN SODIUM 40 MG/0.4ML ~~LOC~~ SOLN: 40.0000 mg | SUBCUTANEOUS | Status: DC

## 2019-08-22 MED ORDER — IOHEXOL 350 MG/ML SOLN
100.0000 mL | Freq: Once | INTRAVENOUS | Status: AC | PRN
  Administered 2019-08-22: 100 mL via INTRAVENOUS

## 2019-08-22 MED ORDER — ONDANSETRON HCL 4 MG/2ML IJ SOLN: 4.0000 mg | Freq: Four times a day (QID) | INTRAMUSCULAR | Status: DC | PRN

## 2019-08-22 MED ORDER — ZOLPIDEM TARTRATE 5 MG PO TABS: 5.0000 mg | ORAL_TABLET | Freq: Every evening | ORAL | Status: DC | PRN

## 2019-08-22 MED ORDER — OSTEO BI-FLEX ADV TRIPLE ST PO TABS: 1.0000 | ORAL_TABLET | Freq: Every day | ORAL | Status: DC

## 2019-08-22 MED ORDER — ALPRAZOLAM 0.25 MG PO TABS
0.2500 mg | ORAL_TABLET | Freq: Two times a day (BID) | ORAL | Status: DC | PRN
  Administered 2019-08-24 – 2019-08-27 (×5): 0.25 mg via ORAL
  Filled 2019-08-22 (×5): qty 1

## 2019-08-22 NOTE — ED Notes (Signed)
Pt otf for imaging 

## 2019-08-22 NOTE — ED Notes (Signed)
Kleenex provided to pt.

## 2019-08-22 NOTE — ED Triage Notes (Signed)
Pt in via EMS from home with c/o SOB since last pm. Pt used inhaler last pm and then again today. Sats 91-92% on RA. Now sats above 94%RA, 138/74, HR 83. Pt with COPD. Lower extremity edema and some weeping

## 2019-08-22 NOTE — ED Provider Notes (Signed)
[facility] Emergency Department Provider Note    First MD Initiated Contact with Patient 08/22/19 1800     (approximate)  I have reviewed the triage vital signs and the nursing notes.   HISTORY  Chief Complaint Shortness of Breath    HPI [name] is a 79 y.o. female [place] past medical history presents to the ER for evaluation of intermittent shortness of breath as well as orthopnea.  States she was feeling very short of breath with wheezing particularly last night.  Is not been on any recent antibiotics but does feel she is having productive cough.  Does have some chest pain that is worse when taking deep inspiration.  Denies any worsening lower extremity swelling although she does have extensive lymphedema that is chronic.  Is not any blood thinners.  Denies pain diagnosed or treated for congestive heart failure that she is aware of.    Past Medical History:  Diagnosis Date  . Acute dermatitis   . Asthma   . COPD (chronic obstructive pulmonary disease) ([place])   . Depression   . GERD (gastroesophageal reflux disease)   . Hyperlipidemia   . Hypertension   . Hypothyroidism   . Incontinence   . Morbid obesity ([place])   . Osteoarthritis   . Osteoarthritis   . PMB (postmenopausal bleeding)   . Sleep apnea   . Vaginal atrophy   . Vaginal Pap smear, abnormal    Family History  Problem Relation Age of Onset  . Alzheimer's disease Mother   . Leukemia Mother   . Brain cancer Brother   . Heart disease Neg Hx   . Diabetes Neg Hx   . Breast cancer Neg Hx   . Ovarian cancer Neg Hx    Past Surgical History:  Procedure Laterality Date  . ABDOMINAL HYSTERECTOMY  2014   [facility]  . CHOLECYSTECTOMY    . DILATION AND CURETTAGE OF UTERUS    . UMBILICAL HERNIA REPAIR     Patient Active Problem List   Diagnosis Date Noted  . Community acquired pneumonia 05/26/2016  . Pneumonia 05/26/2016  . Pressure injury of skin 05/26/2016  . Pressure ulcer  03/24/2015  . Cellulitis 03/23/2015  . Vaginal atrophy 08/19/2014  . Asthma, chronic 04/18/2012  . Obstructive sleep apnea 04/18/2012  . Essential hypertension, benign 04/18/2012  . Hypercholesterolemia 04/18/2012  . Aortic stenosis 04/18/2012  . Endometrial cancer ([place]) 04/18/2012  . Diverticulosis of colon without hemorrhage 04/18/2012  . Unspecified hypothyroidism 04/18/2012  . Overactive bladder 04/18/2012      Prior to Admission medications   Medication Sig Start Date End Date Taking? Authorizing Provider  acetaminophen (TYLENOL) 500 MG tablet Take 500 mg by mouth every 6 (six) hours as needed.    [provider]  ADVAIR DISKUS 250-50 MCG/DOSE AEPB Inhale 1 puff into the lungs 2 (two) times daily. 05/09/16   [provider]  albuterol (PROVENTIL HFA;VENTOLIN HFA) 108 (90 BASE) MCG/ACT inhaler Inhale 2 puffs into the lungs every 6 (six) hours as needed for wheezing or shortness of breath.     [provider]  aspirin 81 MG chewable tablet Chew 81 mg by mouth daily.    [provider]  atorvastatin (LIPITOR) 20 MG tablet Take 20 mg by mouth at bedtime.     [provider]  benzonatate (TESSALON) 100 MG capsule Take 1 capsule (100 mg total) by mouth 3 (three) times daily. 05/30/16   Vaughan Basta, MD  Calcium Carbonate-Vitamin D (  CALCIUM 600+D) 600-400 MG-UNIT tablet Take 1 tablet by mouth daily after lunch.     [provider]  clotrimazole (LOTRIMIN) 1 % external solution Apply 1 application topically 2 (two) times daily. 01/28/17   Merlyn Lot, MD  cyclobenzaprine (FLEXERIL) 10 MG tablet Take 1 tablet (10 mg total) by mouth 3 (three) times daily as needed for muscle spasms. 01/28/17   Merlyn Lot, MD  docusate sodium (COLACE) 100 MG capsule Take 1 capsule (100 mg total) by mouth 2 (two) times daily as needed for mild constipation. 05/30/16   Vaughan Basta, MD  furosemide (LASIX) 20 MG tablet Take 1 tablet  (20 mg total) by mouth daily. 03/26/15   Nicholes Mango, MD  hydrocortisone cream 1 % Apply 1 application topically daily.    [provider]  levothyroxine (SYNTHROID, LEVOTHROID) 125 MCG tablet Take 125 mcg by mouth daily before breakfast.    [provider]  Misc Natural Products (OSTEO BI-FLEX ADV TRIPLE ST) TABS Take 1 tablet by mouth daily.    [provider]  nystatin (MYCOSTATIN/NYSTOP) powder Apply 1 g topically daily as needed.    [provider]  Polyvinyl Alcohol-Povidone (REFRESH OP) Apply 1 drop to eye as needed (for dry eyes).    [provider]  potassium chloride SA (K-DUR,KLOR-CON) 20 MEQ tablet Take 1 tablet (20 mEq total) by mouth daily. 09/23/12   Einar Pheasant, MD  Turmeric 500 MG CAPS Take 500 mg by mouth daily.    [provider]    Allergies Morphine and related and Vicodin [hydrocodone-acetaminophen]    Social History Social History   Tobacco Use  . Smoking status: Never Smoker  . Smokeless tobacco: Never Used  Substance Use Topics  . Alcohol use: No  . Drug use: No    Review of Systems Patient denies headaches, rhinorrhea, blurry vision, numbness, shortness of breath, chest pain, edema, cough, abdominal pain, nausea, vomiting, diarrhea, dysuria, fevers, rashes or hallucinations unless otherwise stated above in HPI. ____________________________________________   PHYSICAL EXAM:  VITAL SIGNS: Vitals:   08/22/19 1918 08/22/19 1920  BP: 117/75   Pulse: 86   Resp: 20   Temp: 98 F (36.7 C)   SpO2: 95% 95%    Constitutional: Alert and oriented.  Eyes: Conjunctivae are normal.  Head: Atraumatic. Nose: No congestion/rhinnorhea. Mouth/Throat: Mucous membranes are moist.   Neck: No stridor. Painless ROM.  Cardiovascular: Normal rate, regular rhythm. Grossly normal heart sounds.  Good peripheral circulation. Respiratory: Normal respiratory effort.  No retractions. Lungs with coarse bibasilar  breathsounds with expiratory wheeze anteriorly Gastrointestinal: Soft and nontender. No distention. No abdominal bruits. No CVA tenderness. Genitourinary:  Musculoskeletal: No lower extremity tenderness.  3+ BLE edema.  No joint effusions. Neurologic:  Normal speech and language. No gross focal neurologic deficits are appreciated. No facial droop Skin:  Skin is warm, dry and intact. No rash noted. Psychiatric: Mood and affect are normal. Speech and behavior are normal.  ____________________________________________   LABS (all labs ordered are listed, but only abnormal results are displayed)  Results for orders placed or performed during the hospital encounter of 08/22/19 (from the past 24 hour(s))  Basic metabolic panel     Status: Abnormal   Collection Time: 08/22/19 12:54 PM  Result Value Ref Range   Sodium 134 (L) 135 - 145 mmol/L   Potassium 4.3 3.5 - 5.1 mmol/L   Chloride 99 98 - 111 mmol/L   CO2 24 22 - 32 mmol/L   Glucose, Bld 140 (  H) 70 - 99 mg/dL   BUN 12 8 - 23 mg/dL   Creatinine, Ser 1.05 (H) 0.44 - 1.00 mg/dL   Calcium 9.5 8.9 - 10.3 mg/dL   GFR calc non Af Amer 51 (L) >60 mL/min   GFR calc Af Amer 59 (L) >60 mL/min   Anion gap 11 5 - 15  CBC     Status: Abnormal   Collection Time: 08/22/19 12:54 PM  Result Value Ref Range   WBC 11.9 (H) 4.0 - 10.5 K/uL   RBC 4.70 3.87 - 5.11 MIL/uL   Hemoglobin 14.9 12.0 - 15.0 g/dL   HCT 42.7 36 - 46 %   MCV 90.9 80.0 - 100.0 fL   MCH 31.7 26.0 - 34.0 pg   MCHC 34.9 30.0 - 36.0 g/dL   RDW 12.9 11.5 - 15.5 %   Platelets 247 150 - 400 K/uL   nRBC 0.0 0.0 - 0.2 %  Brain natriuretic peptide     Status: Abnormal   Collection Time: 08/22/19 12:54 PM  Result Value Ref Range   B Natriuretic Peptide 490.4 (H) 0.0 - 100.0 pg/mL  Troponin I (High Sensitivity)     Status: Abnormal   Collection Time: 08/22/19  6:50 PM  Result Value Ref Range   Troponin I (High Sensitivity) 30 (H) <18 ng/L    ____________________________________________  EKG My review and personal interpretation at Time: 12:53   Indication: sob  Rate: 85  Rhythm: afib Axis: normal Other: normal intervals, nonspecific st abn, pvcs ____________________________________________  RADIOLOGY  I personally reviewed all radiographic images ordered to evaluate for the above acute complaints and reviewed radiology reports and findings.  These findings were personally discussed with the patient.  Please see medical record for radiology report.  ____________________________________________   PROCEDURES  Procedure(s) performed:  Procedures    Critical Care performed: no ____________________________________________   INITIAL IMPRESSION / ASSESSMENT AND PLAN / ED COURSE  Pertinent labs & imaging results that were available during my care of the patient were reviewed by me and considered in my medical decision making (see chart for details).   DDX: Asthma, copd, CHF, pna, ptx, malignancy, Pe, anemia   Shiane Wenberg is a 79 y.o. who presents to the ED with symptoms as described above.  Patient with extensive past medical history presents with shortness of breath and orthopnea.  Possible component of bronchitis COPD but does have significant bilateral lower extremity swelling edema.  Chest x-ray shows evidence of effusions cardiomegaly given her risk factors predominately mobility CTA was ordered to exclude PE.  No evidence of PE or consolidation.  Will give IV Lasix.  She is also noted to be in new A. fib not on any anticoagulation.  Will discuss with hospitalist for admission.     The patient was evaluated in Emergency Department today for the symptoms described in the history of present illness. He/she was evaluated in the context of the global COVID-19 pandemic, which necessitated consideration that the patient might be at risk for infection with the SARS-CoV-2 virus that causes COVID-19. Institutional  protocols and algorithms that pertain to the evaluation of patients at risk for COVID-19 are in a state of rapid change based on information released by regulatory bodies including the CDC and federal and state organizations. These policies and algorithms were followed during the patient's care in the ED.  As part of my medical decision making, I reviewed the following data within the [place] notes reviewed and  incorporated, Labs reviewed, notes from prior ED visits and [place] Controlled Substance Database   ____________________________________________   FINAL CLINICAL IMPRESSION(S) / ED DIAGNOSES  Final diagnoses:  Congestive heart failure, unspecified HF chronicity, unspecified heart failure type ([place])  Atrial fibrillation, unspecified type ([place])      NEW MEDICATIONS STARTED DURING THIS VISIT:  New Prescriptions   No medications on file     Note:  This document was prepared using Dragon voice recognition software and may include unintentional dictation errors.    Merlyn Lot, MD 08/22/19 2023

## 2019-08-22 NOTE — ED Triage Notes (Signed)
Pt to ED via [facility] from home for shortness of breath that started last night. Pt has hx/o COPD. Pt states that she was wheezing last night and felt like she was not getting enough oxygen. Pt does not wear O2 at home. Pt states that her inhaler helped but only for a few hours. Pt has edema with weeping in her LE. Pt is able to speak in complete sentences at this time. Pt is in NAD.

## 2019-08-22 NOTE — ED Notes (Signed)
RN called lab and they stated they will add on th TSH and magnesium from previous blood

## 2019-08-22 NOTE — ED Notes (Signed)
Pt states coming in after her home nurse said "she was gray at the mouth." Pt states she was SOB before the breathing treatments she got here. Pt now denies SOB. Pt states pain to the buttock due to chronic pressure and to the right knee.

## 2019-08-22 NOTE — H&P (Signed)
[facility] NAME: [name]    MR#:  [id number]  DATE OF BIRTH:  Apr 12, 1940  DATE OF ADMISSION:  08/22/2019  PRIMARY CARE PHYSICIAN: [facility]   REQUESTING/REFERRING PHYSICIAN: Merlyn Lot, MD CHIEF COMPLAINT:   Chief Complaint  Patient presents with  . Shortness of Breath    HISTORY OF PRESENT ILLNESS:  [name]  is a 79 y.o. morbidly obese Caucasian female with a known history of asthma, COPD, hypertension, hypothyroidism, dyslipidemia and sleep apnea, who presented to the emergency room with acute onset of worsening dyspnea with associated orthopnea and lower extremity edema over the last several days.  She admitted to cough productive of whitish sputum and occasional wheezing.  She also admits to occasional chest pain that feels like mild tightness with ambulation.  She has been having dyspnea on exertion.  She denies any palpitations.  No fever or chills.  No nausea or vomiting or abdominal pain.  Upon presentation to the emergency room, blood pressure was 120/93 with otherwise normal vital signs.  Pulse oximetry was 94% on room air.  Labs revealed showed very mild hazy right basal atelectasis and/or infiltrate with small left pleural effusion.  EKG showed atrial fibrillation with controlled ventricular sponsor of 87 with premature ventricular or aberrantly conducted complexes and nonspecific ST and T wave abnormalities. Chest CTA showed: 1. No pulmonary emboli. 2. Cardiomegaly with mild interstitial edema and small bilateral pleural effusions, right greater than left. Findings are consistent with congestive heart failure. 3. Aortic atherosclerosis.  The patient was given 40 mg of IV Lasix and 2 duo nebs.  She will be admitted to a progressive cardiac unit bed for further evaluation and management PAST MEDICAL HISTORY:   Past Medical History:  Diagnosis Date  . Acute dermatitis   . Asthma   . COPD (chronic  obstructive pulmonary disease) ([place])   . Depression   . GERD (gastroesophageal reflux disease)   . Hyperlipidemia   . Hypertension   . Hypothyroidism   . Incontinence   . Morbid obesity ([name])   . Osteoarthritis   . Osteoarthritis   . PMB (postmenopausal bleeding)   . Sleep apnea   . Vaginal atrophy   . Vaginal Pap smear, abnormal     PAST SURGICAL HISTORY:   Past Surgical History:  Procedure Laterality Date  . ABDOMINAL HYSTERECTOMY  2014   [facility]  . CHOLECYSTECTOMY    . DILATION AND CURETTAGE OF UTERUS    . UMBILICAL HERNIA REPAIR      SOCIAL HISTORY:   Social History   Tobacco Use  . Smoking status: Never Smoker  . Smokeless tobacco: Never Used  Substance Use Topics  . Alcohol use: No    FAMILY HISTORY:   Family History  Problem Relation Age of Onset  . Alzheimer's disease Mother   . Leukemia Mother   . Brain cancer Brother   . Heart disease Neg Hx   . Diabetes Neg Hx   . Breast cancer Neg Hx   . Ovarian cancer Neg Hx     DRUG ALLERGIES:   Allergies  Allergen Reactions  . Morphine And Related   . Vicodin [Hydrocodone-Acetaminophen] Other (See Comments)    Reaction:  Raises pts BP     REVIEW OF SYSTEMS:   ROS As per history of present illness. All pertinent systems were reviewed above. Constitutional,  HEENT, cardiovascular, respiratory, GI, GU, musculoskeletal, neuro, psychiatric, endocrine,  integumentary and hematologic systems were  reviewed and are otherwise  negative/unremarkable except for positive findings mentioned above in the HPI.   MEDICATIONS AT HOME:   Prior to Admission medications   Medication Sig Start Date End Date Taking? Authorizing Provider  acetaminophen (TYLENOL) 500 MG tablet Take 500 mg by mouth every 6 (six) hours as needed.    [provider]  ADVAIR DISKUS 250-50 MCG/DOSE AEPB Inhale 1 puff into the lungs 2 (two) times daily. 05/09/16   [provider]  albuterol (PROVENTIL HFA;VENTOLIN HFA) 108 (90  BASE) MCG/ACT inhaler Inhale 2 puffs into the lungs every 6 (six) hours as needed for wheezing or shortness of breath.     [provider]  aspirin 81 MG chewable tablet Chew 81 mg by mouth daily.    [provider]  atorvastatin (LIPITOR) 20 MG tablet Take 20 mg by mouth at bedtime.     [provider]  benzonatate (TESSALON) 100 MG capsule Take 1 capsule (100 mg total) by mouth 3 (three) times daily. 05/30/16   Vaughan Basta, MD  Calcium Carbonate-Vitamin D (CALCIUM 600+D) 600-400 MG-UNIT tablet Take 1 tablet by mouth daily after lunch.     [provider]  clotrimazole (LOTRIMIN) 1 % external solution Apply 1 application topically 2 (two) times daily. 01/28/17   Merlyn Lot, MD  cyclobenzaprine (FLEXERIL) 10 MG tablet Take 1 tablet (10 mg total) by mouth 3 (three) times daily as needed for muscle spasms. 01/28/17   Merlyn Lot, MD  docusate sodium (COLACE) 100 MG capsule Take 1 capsule (100 mg total) by mouth 2 (two) times daily as needed for mild constipation. 05/30/16   Vaughan Basta, MD  furosemide (LASIX) 20 MG tablet Take 1 tablet (20 mg total) by mouth daily. 03/26/15   Nicholes Mango, MD  hydrocortisone cream 1 % Apply 1 application topically daily.    [provider]  levothyroxine (SYNTHROID, LEVOTHROID) 125 MCG tablet Take 125 mcg by mouth daily before breakfast.    [provider]  Misc Natural Products (OSTEO BI-FLEX ADV TRIPLE ST) TABS Take 1 tablet by mouth daily.    [provider]  nystatin (MYCOSTATIN/NYSTOP) powder Apply 1 g topically daily as needed.    [provider]  Polyvinyl Alcohol-Povidone (REFRESH OP) Apply 1 drop to eye as needed (for dry eyes).    [provider]  potassium chloride SA (K-DUR,KLOR-CON) 20 MEQ tablet Take 1 tablet (20 mEq total) by mouth daily. 09/23/12   Einar Pheasant, MD  Turmeric 500 MG CAPS Take 500 mg by mouth daily.    [provider]       VITAL SIGNS:  Blood pressure 106/90, pulse 81, temperature 98 F (36.7 C), temperature source Oral, resp. rate (!) 22, height 5\' 5"  (1.651 m), weight (!) 181.4 kg, last menstrual period 04/16/1993, SpO2 95 %.  PHYSICAL EXAMINATION:  Physical Exam  GENERAL:  79 y.o.-year-old morbidly obese Caucasian female patient lying in the bed with minimal conversational dyspnea.  EYES: Pupils equal, round, reactive to light and accommodation. No scleral icterus. Extraocular muscles intact.  HEENT: Head atraumatic, normocephalic. Oropharynx and nasopharynx clear.  NECK:  Supple, no jugular venous distention. No thyroid enlargement, no tenderness.  LUNGS: Diminished bibasal breath sounds with fine bibasilar rales. CARDIOVASCULAR: Regular rate and rhythm, S1, S2 normal. No murmurs, rubs, or gallops.  ABDOMEN: Soft, nondistended, nontender. Bowel sounds present. No organomegaly or mass.  EXTREMITIES: 2+ bilateral lower extremity pitting edema with no cyanosis, or clubbing.  NEUROLOGIC: Cranial nerves II through  XII are intact. Muscle strength 5/5 in all extremities. Sensation intact. Gait not checked.  PSYCHIATRIC: The patient is alert and oriented x 3.  Normal affect and good eye contact. SKIN: No obvious rash, lesion, or ulcer.   LABORATORY PANEL:   CBC Recent Labs  Lab 08/22/19 1254  WBC 11.9*  HGB 14.9  HCT 42.7  PLT 247   ------------------------------------------------------------------------------------------------------------------  Chemistries  Recent Labs  Lab 08/22/19 1254  NA 134*  K 4.3  CL 99  CO2 24  GLUCOSE 140*  BUN 12  CREATININE 1.05*  CALCIUM 9.5   ------------------------------------------------------------------------------------------------------------------  Cardiac Enzymes No results for input(s): TROPONINI in the last 168  hours. ------------------------------------------------------------------------------------------------------------------  RADIOLOGY:  DG Chest 2 View  Result Date: 08/22/2019 CLINICAL DATA:  Dyspnea. EXAM: CHEST - 2 VIEW COMPARISON:  None. FINDINGS: Very mild, hazy right basilar atelectasis and/or infiltrate is noted. There is a small left pleural effusion. No pneumothorax is identified. The cardiac silhouette is mildly enlarged and unchanged in size. There is mild calcification of the aortic arch. Multilevel degenerative changes seen throughout the thoracic spine. IMPRESSION: 1. Very mild, hazy right basilar atelectasis and/or infiltrate. 2. Small left pleural effusion. Electronically Signed   By: Virgina Norfolk M.D.   On: 08/22/2019 19:05   CT Angio Chest PE W and/or Wo Contrast  Result Date: 08/22/2019 CLINICAL DATA:  Shortness of breath. EXAM: CT ANGIOGRAPHY CHEST WITH CONTRAST TECHNIQUE: Multidetector CT imaging of the chest was performed using the standard protocol during bolus administration of intravenous contrast. Multiplanar CT image reconstructions and MIPs were obtained to evaluate the vascular anatomy. CONTRAST:  141mL OMNIPAQUE IOHEXOL 350 MG/ML SOLN COMPARISON:  Chest x-ray dated 08/22/2019 FINDINGS: Cardiovascular: No pulmonary emboli. Aortic atherosclerosis. Coronary artery calcifications. Calcification in the aortic valve and mitral valve annulus. Or lung cardiomegaly. Trace pericardial effusion. Mediastinum/Nodes: Thyroid gland is normal. No hilar or mediastinal adenopathy. Tiny hiatal hernia. Lungs/Pleura: Slight bilateral interstitial edema. Small bilateral pleural effusions, right greater than left. Upper Abdomen: Normal. Musculoskeletal: No chest wall abnormality. No acute or significant osseous findings. Review of the MIP images confirms the above findings. IMPRESSION: 1. No pulmonary emboli. 2. Cardiomegaly with mild interstitial edema and small bilateral pleural effusions,  right greater than left. Findings are consistent with congestive heart failure. 3. Aortic atherosclerosis. Aortic Atherosclerosis (ICD10-I70.0). Electronically Signed   By: Lorriane Shire M.D.   On: 08/22/2019 19:52      IMPRESSION AND PLAN:   1.  New onset acute CHF likely diastolic. -The patient will be admitted to a progressive unit bed. -She will be continued on diuresis with IV Lasix. -We will follow serial troponin I's. -We will check TSH level and magnesium level. -We will obtain a 2D echo and a cardiology consultation. -I notified Dr. Rockey Situ about the patient.  2.  New onset atrial fibrillation with currently controlled ventricular response. -The patient CHA2DS2-VASc score is 5. -We will start her on p.o. Eliquis. -TSH and magnesium level will be checked. -Cardiology consultation and 2D echo will be obtained tomorrow as mentioned above.  3.  Chest pain.  This is likely secondary to atrial fibrillation.  Will need to rule out acute coronary syndrome. -We will follow serial troponin I's and 2D echo. -Cardiology consult will be obtained for further cardiac risk stratification.  4.  COPD/asthma without acute exacerbation. -She will be placed on scheduled and as needed duo nebs. -We will hold off her Advair Diskus and Breo Ellipta.  5.  Dyslipidemia. -We will continue statin  therapy.  6. Hypothyroidism. -We will continue Synthroid and check TSH level.  7.  DVT prophylaxis. -The patient will be placed on p.o. Eliquis.  All the records are reviewed and case discussed with ED provider. The plan of care was discussed in details with the patient (and family). Patient And wheezing line and measures continues to many people transition to working vascular status on right I answered all questions. The patient agreed to proceed with the above mentioned plan. Further management will depend upon hospital course.   CODE STATUS: Full code  Status is: Inpatient  Remains inpatient  appropriate because:Ongoing diagnostic testing needed not appropriate for outpatient work up, Unsafe d/c plan, IV treatments appropriate due to intensity of illness or inability to take PO and Inpatient level of care appropriate due to severity of illness   Dispo: The patient is from: Home              Anticipated d/c is to: Home              Anticipated d/c date is: 2 days              Patient currently is not medically stable to d/c.  Follow-up TOTAL TIME TAKING CARE OF THIS PATIENT: 55 minutes.    Christel Mormon M.D on 08/22/2019 at 9:02 PM  [facility]   From 7 PM-7 AM, contact night-coverage [email or web address]  CC: Primary care physician; [facility]   Note: This dictation was prepared with Dragon dictation along with smaller phrase technology. Any transcriptional typo errors that result from this process are unintentional.

## 2019-08-23 ENCOUNTER — Inpatient Hospital Stay: Admit: 2019-08-23 | Payer: Medicare HMO

## 2019-08-23 ENCOUNTER — Encounter: Payer: Self-pay | Admitting: Family Medicine

## 2019-08-23 DIAGNOSIS — I509 Heart failure, unspecified: Secondary | ICD-10-CM

## 2019-08-23 LAB — BASIC METABOLIC PANEL
Anion gap: 9 (ref 5–15)
BUN: 11 mg/dL (ref 8–23)
CO2: 29 mmol/L (ref 22–32)
Calcium: 8.8 mg/dL — ABNORMAL LOW (ref 8.9–10.3)
Chloride: 99 mmol/L (ref 98–111)
Creatinine, Ser: 0.93 mg/dL (ref 0.44–1.00)
GFR calc Af Amer: >60 mL/min (ref >60)
GFR calc non Af Amer: 59 mL/min — ABNORMAL LOW (ref >60)
Glucose, Bld: 112 mg/dL — ABNORMAL HIGH (ref 70–99)
Potassium: 3.7 mmol/L (ref 3.5–5.1)
Sodium: 137 mmol/L (ref 135–145)

## 2019-08-23 LAB — CBC
HCT: 40.5 % (ref 36.0–46.0)
Hemoglobin: 13.8 g/dL (ref 12.0–15.0)
MCH: 31.5 pg (ref 26.0–34.0)
MCHC: 34.1 g/dL (ref 30.0–36.0)
MCV: 92.5 fL (ref 80.0–100.0)
Platelets: 231 10*3/uL (ref 150–400)
RBC: 4.38 MIL/uL (ref 3.87–5.11)
RDW: 13 % (ref 11.5–15.5)
WBC: 9.7 10*3/uL (ref 4.0–10.5)
nRBC: 0 % (ref 0.0–0.2)

## 2019-08-23 LAB — TROPONIN I (HIGH SENSITIVITY): Troponin I (High Sensitivity): 35 ng/L — ABNORMAL HIGH (ref <18)

## 2019-08-23 MED ORDER — POTASSIUM CHLORIDE CRYS ER 20 MEQ PO TBCR
20.0000 meq | EXTENDED_RELEASE_TABLET | Freq: Two times a day (BID) | ORAL | Status: DC
  Administered 2019-08-23 – 2019-08-28 (×11): 20 meq via ORAL
  Filled 2019-08-23 (×11): qty 1

## 2019-08-23 MED ORDER — NYSTATIN 100000 UNIT/GM EX CREA
TOPICAL_CREAM | Freq: Two times a day (BID) | CUTANEOUS | Status: DC
  Administered 2019-08-25: 1 via TOPICAL
  Filled 2019-08-23 (×3): qty 15

## 2019-08-23 NOTE — Progress Notes (Signed)
PROGRESS NOTE    [name] [facility]  MCN:[id number] DOB: September 17, 1940 DOA: 08/22/2019 PCP: Orvis Brill[facility]    Assessment & Plan:   Active Problems:   Acute CHF (congestive heart failure) (HCC)  Acute CHF exacerbation: new onset. Unknown systolic vs diastolic vs mixed. Echo ordered. Continue on IV lasix. Monitor I/Os & daily weights. Cardio consulted   Atrial fibrillation: new onset. Rate controlled. CHA2DS2-VASc score is 5, so started on eliquis. Continue on tele.  . Chest pain: likely secondary to atrial fibrillation. Troponins are minimally elevated. Cardio consulted    COPD: w/o acute exacerbation. Continue on advair & breo  Dyslipidemia continue statin  Hypothyroidism: continue on levothyroxine   Morbid obesity: BMI 66.5. Would benefit from significant weight loss  DVT prophylaxis: eliquis Code Status: full  Family Communication: Disposition Plan: depends on PT/OT recs   Consultants:   cardio   Procedures:    Antimicrobials:    Subjective: Pt c/o shortness of breath   Objective: Vitals:   08/23/19 0530 08/23/19 0600 08/23/19 0630 08/23/19 0800  BP: (!) 114/52 121/60 (!) 110/53 (!) 113/55  Pulse: 78 74 86 89  Resp:  (!) 21 18 18   Temp:      TempSrc:      SpO2: 96% 100% 93% 96%  Weight:      Height:        Intake/Output Summary (Last 24 hours) at 08/23/2019 0837 Last data filed at 08/23/2019 6294 Gross per 24 hour  Intake --  Output 650 ml  Net -650 ml   Filed Weights   08/22/19 1250  Weight: (!) 181.4 kg    Examination:  General exam: Appears calm and comfortable  Respiratory system: diminished breath sounds b/l. Difficulty talking in complete sentences Cardiovascular system: irregularly irregular. No rubs, gallops or clicks.  Gastrointestinal system: Abdomen is obese, soft and nontender. Normal bowel sounds heard. Central nervous system: Alert and oriented. No focal neurological deficits. Psychiatry: Judgement and insight  appear normal. Mood & affect appropriate.     Data Reviewed: I have personally reviewed following labs and imaging studies  CBC: Recent Labs  Lab 08/22/19 1254  WBC 11.9*  HGB 14.9  HCT 42.7  MCV 90.9  PLT 765   Basic Metabolic Panel: Recent Labs  Lab 08/22/19 1254 08/22/19 1850 08/23/19 0541  NA 134*  --  137  K 4.3  --  3.7  CL 99  --  99  CO2 24  --  29  GLUCOSE 140*  --  112*  BUN 12  --  11  CREATININE 1.05*  --  0.93  CALCIUM 9.5  --  8.8*  MG  --  1.7  --    GFR: Estimated Creatinine Clearance: 84.1 mL/min (by C-G formula based on SCr of 0.93 mg/dL). Liver Function Tests: No results for input(s): AST, ALT, ALKPHOS, BILITOT, PROT, ALBUMIN in the last 168 hours. No results for input(s): LIPASE, AMYLASE in the last 168 hours. No results for input(s): AMMONIA in the last 168 hours. Coagulation Profile: No results for input(s): INR, PROTIME in the last 168 hours. Cardiac Enzymes: No results for input(s): CKTOTAL, CKMB, CKMBINDEX, TROPONINI in the last 168 hours. BNP (last 3 results) No results for input(s): PROBNP in the last 8760 hours. HbA1C: No results for input(s): HGBA1C in the last 72 hours. CBG: No results for input(s): GLUCAP in the last 168 hours. Lipid Profile: No results for input(s): CHOL, HDL, LDLCALC, TRIG, CHOLHDL, LDLDIRECT in the last 72 hours. Thyroid Function  Tests: Recent Labs    08/22/19 1850  TSH 1.973   Anemia Panel: No results for input(s): VITAMINB12, FOLATE, FERRITIN, TIBC, IRON, RETICCTPCT in the last 72 hours. Sepsis Labs: No results for input(s): PROCALCITON, LATICACIDVEN in the last 168 hours.  Recent Results (from the past 240 hour(s))  SARS Coronavirus 2 by RT PCR (hospital order, performed in [facility] hospital lab) Nasopharyngeal Nasopharyngeal Swab     Status: None   Collection Time: 08/22/19  8:47 PM   Specimen: Nasopharyngeal Swab  Result Value Ref Range Status   SARS Coronavirus 2 NEGATIVE NEGATIVE Final     Comment: (NOTE) SARS-CoV-2 target nucleic acids are NOT DETECTED.  The SARS-CoV-2 RNA is generally detectable in upper and lower respiratory specimens during the acute phase of infection. The lowest concentration of SARS-CoV-2 viral copies this assay can detect is 250 copies / mL. A negative result does not preclude SARS-CoV-2 infection and should not be used as the sole basis for treatment or other patient management decisions.  A negative result may occur with improper specimen collection / handling, submission of specimen other than nasopharyngeal swab, presence of viral mutation(s) within the areas targeted by this assay, and inadequate number of viral copies (<250 copies / mL). A negative result must be combined with clinical observations, patient history, and epidemiological information.  Fact Sheet for Patients:   StrictlyIdeas.no  Fact Sheet for Healthcare Providers: BankingDealers.co.za  This test is not yet approved or  cleared by the [place] FDA and has been authorized for detection and/or diagnosis of SARS-CoV-2 by FDA under an Emergency Use Authorization (EUA).  This EUA will remain in effect (meaning this test can be used) for the duration of the COVID-19 declaration under Section 564(b)(1) of the Act, 21 U.S.C. section 360bbb-3(b)(1), unless the authorization is terminated or revoked sooner.  Performed at [facility], [address] [place], [place] [address]          Radiology Studies: DG Chest 2 View  Result Date: 08/22/2019 CLINICAL DATA:  Dyspnea. EXAM: CHEST - 2 VIEW COMPARISON:  None. FINDINGS: Very mild, hazy right basilar atelectasis and/or infiltrate is noted. There is a small left pleural effusion. No pneumothorax is identified. The cardiac silhouette is mildly enlarged and unchanged in size. There is mild calcification of the aortic arch. Multilevel degenerative changes seen throughout  the thoracic spine. IMPRESSION: 1. Very mild, hazy right basilar atelectasis and/or infiltrate. 2. Small left pleural effusion. Electronically Signed   By: Virgina Norfolk M.D.   On: 08/22/2019 19:05   CT Angio Chest PE W and/or Wo Contrast  Result Date: 08/22/2019 CLINICAL DATA:  Shortness of breath. EXAM: CT ANGIOGRAPHY CHEST WITH CONTRAST TECHNIQUE: Multidetector CT imaging of the chest was performed using the standard protocol during bolus administration of intravenous contrast. Multiplanar CT image reconstructions and MIPs were obtained to evaluate the vascular anatomy. CONTRAST:  163mL OMNIPAQUE IOHEXOL 350 MG/ML SOLN COMPARISON:  Chest x-ray dated 08/22/2019 FINDINGS: Cardiovascular: No pulmonary emboli. Aortic atherosclerosis. Coronary artery calcifications. Calcification in the aortic valve and mitral valve annulus. Or lung cardiomegaly. Trace pericardial effusion. Mediastinum/Nodes: Thyroid gland is normal. No hilar or mediastinal adenopathy. Tiny hiatal hernia. Lungs/Pleura: Slight bilateral interstitial edema. Small bilateral pleural effusions, right greater than left. Upper Abdomen: Normal. Musculoskeletal: No chest wall abnormality. No acute or significant osseous findings. Review of the MIP images confirms the above findings. IMPRESSION: 1. No pulmonary emboli. 2. Cardiomegaly with mild interstitial edema and small bilateral pleural effusions, right greater  than left. Findings are consistent with congestive heart failure. 3. Aortic atherosclerosis. Aortic Atherosclerosis (ICD10-I70.0). Electronically Signed   By: Lorriane Shire M.D.   On: 08/22/2019 19:52        Scheduled Meds: . apixaban  5 mg Oral BID  . atorvastatin  20 mg Oral QHS  . benzonatate  100 mg Oral TID  . calcium-vitamin D  1 tablet Oral QPC lunch  . clotrimazole  1 application Topical BID  . furosemide  40 mg Intravenous Q12H  . ipratropium-albuterol  3 mL Nebulization QID  . levothyroxine  125 mcg Oral QAC  breakfast  . nystatin cream   Topical BID  . potassium chloride SA  20 mEq Oral Daily  . sodium chloride flush  3 mL Intravenous Q12H   Continuous Infusions: . sodium chloride       LOS: 1 day    Time spent: 33 mins     Wyvonnia Dusky, MD [facility] Pager [phone]  If 7PM-7AM, please contact night-coverage [email or web address] 08/23/2019, 8:37 AM

## 2019-08-23 NOTE — Consult Note (Addendum)
Cardiology Consultation:   Patient ID: [name] [facility] MRN: [id number]; DOB: Dec 07, 1940  Admit date: 08/22/2019 Date of Consult: 08/23/2019  Primary Care Provider: Orvis Brill, [facility] Primary Cardiologist: Iverson Alamin, Dr. Percival Spanish rounding Primary Electrophysiologist:  None   Patient Profile:   [name] is a 79 y.o. female with a hx of HTN, HLD, lymphedema, COPD, hypothyroidism, GERD, OSA, OA, and morbid obesity, and who is being seen today for the evaluation of suspected heart failure at the request of Dr. Sidney Ace.  History of Present Illness:   [name] is a 79 yo female with PMH as above. She denies a history of smoking or alcohol use. She has no previously known history of heart failure, CAD, or arrhythmia.  She reports that she does drink a lot of fluid but is unable to estimate the amount, reporting "probably too much fluid." She does admit to salt intake as well.   Over the last several days, she reports increasing shortness of breath, wheezing, and dyspnea on exertion.  She also reports chronic/stable lower extremity edema with known lymphedema.  Her SOB/DOE became acutely worse the evening before last (6/17). She used her inhaler that evening and again the afternoon of 6/18 with only temporary relief.  She reports she specifically felt short of breath when walking from the living room (where she sleeps in a recliner and has for many years) to the bathroom, which concerned her. She also noted cough with white sputum, as well as orthopnea.  No chest pain.  No palpitations, pnd, n, v, dizziness, syncope, edema, weight gain, or early satiety. She decided to present to [facility] ED 6/18.  Of note, when standing on the scale for her weight in the ED, she noted diaphoresis that improved with standing still but otherwise no other report of diaphoresis.  In the ED, vitals significant for BP 120/93, 90 bpm, 94% on room air, RR 18.  Weight 181.4 kg.  Labs significant for sodium 134,  potassium 4.3, creatinine 1.05, BUN 12, magnesium 11, BNP 490.4, hemoglobin 14.9, hematocrit 42.7, HS Tn 30  29  35, TSH 1.973.  Chest x-ray significant for mild and hazy right basilar atelectasis and/or infiltrate and small left pleural effusion.  CTA showed no evidence of PE, cardiomegaly with mild interstitial edema and small bilateral pleural effusions (R>L), and aortic atherosclerosis. EKG showed new onset atrial fibrillation with PVCs, ventricular rate 87 bpm, nonspecific ST/T changes. She was started on IV lasix and inhalers and admitted with cardiology consulted.  Today, she reports improvement in her breathing since starting on IV Lasix.  Of note, she does take p.o. Lasix at home but does not feel that it significantly helps her.  Heart Pathway Score:     Past Medical History:  Diagnosis Date  . Asthma   . COPD (chronic obstructive pulmonary disease) ([place])   . Depression   . GERD (gastroesophageal reflux disease)   . Hyperlipidemia   . Hypertension   . Hypothyroidism   . Morbid obesity ([place])   . Osteoarthritis   . PMB (postmenopausal bleeding)   . Sleep apnea   . Vaginal atrophy     Past Surgical History:  Procedure Laterality Date  . ABDOMINAL HYSTERECTOMY  2014   [facility]  . CHOLECYSTECTOMY    . DILATION AND CURETTAGE OF UTERUS    . UMBILICAL HERNIA REPAIR       Home Medications:  Prior to Admission medications   Medication Sig Start Date End Date Taking? Authorizing Provider  acetaminophen (TYLENOL) 500 MG tablet Take 500-1,000 mg by mouth every 6 (six) hours as needed for mild pain or fever.    Yes [provider]  albuterol (PROVENTIL HFA;VENTOLIN HFA) 108 (90 BASE) MCG/ACT inhaler Inhale 2 puffs into the lungs every 6 (six) hours as needed for wheezing or shortness of breath.    Yes [provider]  amLODipine (NORVASC) 5 MG tablet Take 5 mg by mouth daily. 08/04/19  Yes [provider]  aspirin 81 MG chewable tablet Chew 81 mg by mouth daily.    Yes [provider]  Calcium Carbonate-Vitamin D (CALCIUM 600+D) 600-400 MG-UNIT tablet Take 1 tablet by mouth daily after lunch.    Yes [provider]  furosemide (LASIX) 20 MG tablet Take 40 mg by mouth daily as needed for fluid or edema.   Yes [provider]  guaiFENesin (MUCINEX) 600 MG 12 hr tablet Take 600 mg by mouth 2 (two) times daily as needed for to loosen phlegm.   Yes [provider]  levothyroxine (SYNTHROID, LEVOTHROID) 125 MCG tablet Take 125 mcg by mouth daily before breakfast.   Yes [provider]  loratadine (CLARITIN) 10 MG tablet Take 10 mg by mouth daily.   Yes [provider]  nystatin cream (MYCOSTATIN) Apply 1 application topically 2 (two) times daily.  08/05/19  Yes [provider]  polyethylene glycol (MIRALAX / GLYCOLAX) 17 g packet Take 17 g by mouth daily as needed for moderate constipation.   Yes [provider]  Polyvinyl Alcohol-Povidone (REFRESH OP) Place 1 drop into both eyes as needed (for dry eyes).    Yes [provider]  pravastatin (PRAVACHOL) 20 MG tablet Take 20 mg by mouth at bedtime. 06/03/19  Yes [provider]  vitamin B-12 (CYANOCOBALAMIN) 1000 MCG tablet Take 1,000 mcg by mouth daily.   Yes [provider]    Inpatient Medications: Scheduled Meds: . apixaban  5 mg Oral BID  . atorvastatin  20 mg Oral QHS  . benzonatate  100 mg Oral TID  . calcium-vitamin D  1 tablet Oral QPC lunch  . clotrimazole  1 application Topical BID  . furosemide  40 mg Intravenous Q12H  . ipratropium-albuterol  3 mL Nebulization QID  . levothyroxine  125 mcg Oral QAC breakfast  . nystatin cream   Topical BID  . potassium chloride SA  20 mEq Oral BID  . sodium chloride flush  3 mL Intravenous Q12H   Continuous Infusions: . sodium chloride     PRN Meds: sodium chloride, acetaminophen, ALPRAZolam, cyclobenzaprine, docusate sodium, ondansetron (ZOFRAN) IV, sodium  chloride flush  Allergies:    Allergies  Allergen Reactions  . Morphine And Related   . Vicodin [Hydrocodone-Acetaminophen] Other (See Comments)    Reaction:  Raises pts BP     Social History:   Social History   Socioeconomic History  . Marital status: Married    Spouse name: Not on file  . Number of children: 3  . Years of education: Not on file  . Highest education level: Not on file  Occupational History  . Not on file  Tobacco Use  . Smoking status: Never Smoker  . Smokeless tobacco: Never Used  Substance and Sexual Activity  . Alcohol use: No  . Drug use: No  . Sexual activity: Never  Other Topics Concern  . Not on file  Social History Narrative  . Not on file   Social Determinants of Health   Financial Resource  Strain:   . Difficulty of Paying Living Expenses:   Food Insecurity:   . Worried About Charity fundraiser in the Last Year:   . Arboriculturist in the Last Year:   Transportation Needs:   . Film/video editor (Medical):   Marland Kitchen Lack of Transportation (Non-Medical):   Physical Activity:   . Days of Exercise per Week:   . Minutes of Exercise per Session:   Stress:   . Feeling of Stress :   Social Connections:   . Frequency of Communication with Friends and Family:   . Frequency of Social Gatherings with Friends and Family:   . Attends Religious Services:   . Active Member of Clubs or Organizations:   . Attends Archivist Meetings:   Marland Kitchen Marital Status:   Intimate Partner Violence:   . Fear of Current or Ex-Partner:   . Emotionally Abused:   Marland Kitchen Physically Abused:   . Sexually Abused:     Family History:    Family History  Problem Relation Age of Onset  . Alzheimer's disease Mother   . Leukemia Mother   . Brain cancer Brother   . Heart disease Neg Hx   . Diabetes Neg Hx   . Breast cancer Neg Hx   . Ovarian cancer Neg Hx      ROS:  Please see the history of present illness.  Review of Systems  Constitutional: Positive for  diaphoresis.       Reported when standing on the scale for her weight and improving within minutes of standing still.  Respiratory: Positive for cough, sputum production, shortness of breath and wheezing. Negative for hemoptysis.   Cardiovascular: Positive for orthopnea and leg swelling. Negative for chest pain and palpitations.  Gastrointestinal: Negative for abdominal pain, blood in stool and melena.  Musculoskeletal: Negative for falls.  Neurological: Negative for dizziness.  All other systems reviewed and are negative.   All other ROS reviewed and negative.     Physical Exam/Data:   Vitals:   08/23/19 0800 08/23/19 1012 08/23/19 1127 08/23/19 1139  BP: (!) 113/55 104/76  (!) 105/50  Pulse: 89 82  96  Resp: 18 18  18   Temp:  98.2 F (36.8 C)  98.2 F (36.8 C)  TempSrc:  Oral    SpO2: 96% 95% 93% 95%  Weight:      Height:        Intake/Output Summary (Last 24 hours) at 08/23/2019 1329 Last data filed at 08/23/2019 6010 Gross per 24 hour  Intake --  Output 650 ml  Net -650 ml   Last 3 Weights 08/22/2019 11/27/2017 01/28/2017  Weight (lbs) 400 lb 399 lb 0.5 oz 400 lb  Weight (kg) 181.439 kg 181 kg 181.439 kg     Body mass index is 66.56 kg/m.  General: Obese female, in no acute distress HEENT: normal Neck: JVD difficult to assess 2/2 body habitus Vascular: No carotid bruits; radial pulses 2+ bilaterally Cardiac: Distant heart sounds normal S1, S2; IRIR; no murmur   Lungs:  clear to auscultation bilaterally with bibasilar reduced breath sounds, slight wheezing, no rhonchi or rales  Abd: obese, soft, nontender, no hepatomegaly  Ext: no pretibial pitting edema Musculoskeletal:  No deformities, BUE and BLE strength normal and equal Skin: warm and dry  Neuro:  No focal abnormalities noted Psych:  Normal affect   EKG:  The EKG was personally reviewed and demonstrates:  A. fib with PVCs, 87 bpm, nonspecific ST/T changes Telemetry:  Telemetry was personally reviewed and  demonstrates:  Afib with PVCs, rates 70s-low 100s  Relevant CV Studies: Pending echo  Laboratory Data:  High Sensitivity Troponin:   Recent Labs  Lab 08/22/19 1850 08/22/19 2047 08/23/19 0541  TROPONINIHS 30* 29* 35*     Cardiac EnzymesNo results for input(s): TROPONINI in the last 168 hours. No results for input(s): TROPIPOC in the last 168 hours.  Chemistry Recent Labs  Lab 08/22/19 1254 08/23/19 0541  NA 134* 137  K 4.3 3.7  CL 99 99  CO2 24 29  GLUCOSE 140* 112*  BUN 12 11  CREATININE 1.05* 0.93  CALCIUM 9.5 8.8*  GFRNONAA 51* 59*  GFRAA 59* >60  ANIONGAP 11 9    No results for input(s): PROT, ALBUMIN, AST, ALT, ALKPHOS, BILITOT in the last 168 hours. Hematology Recent Labs  Lab 08/22/19 1254 08/23/19 1121  WBC 11.9* 9.7  RBC 4.70 4.38  HGB 14.9 13.8  HCT 42.7 40.5  MCV 90.9 92.5  MCH 31.7 31.5  MCHC 34.9 34.1  RDW 12.9 13.0  PLT 247 231   BNP Recent Labs  Lab 08/22/19 1254  BNP 490.4*    DDimer No results for input(s): DDIMER in the last 168 hours.   Radiology/Studies:  DG Chest 2 View  Result Date: 08/22/2019 CLINICAL DATA:  Dyspnea. EXAM: CHEST - 2 VIEW COMPARISON:  None. FINDINGS: Very mild, hazy right basilar atelectasis and/or infiltrate is noted. There is a small left pleural effusion. No pneumothorax is identified. The cardiac silhouette is mildly enlarged and unchanged in size. There is mild calcification of the aortic arch. Multilevel degenerative changes seen throughout the thoracic spine. IMPRESSION: 1. Very mild, hazy right basilar atelectasis and/or infiltrate. 2. Small left pleural effusion. Electronically Signed   By: Virgina Norfolk M.D.   On: 08/22/2019 19:05   CT Angio Chest PE W and/or Wo Contrast  Result Date: 08/22/2019 CLINICAL DATA:  Shortness of breath. EXAM: CT ANGIOGRAPHY CHEST WITH CONTRAST TECHNIQUE: Multidetector CT imaging of the chest was performed using the standard protocol during bolus administration of  intravenous contrast. Multiplanar CT image reconstructions and MIPs were obtained to evaluate the vascular anatomy. CONTRAST:  126mL OMNIPAQUE IOHEXOL 350 MG/ML SOLN COMPARISON:  Chest x-ray dated 08/22/2019 FINDINGS: Cardiovascular: No pulmonary emboli. Aortic atherosclerosis. Coronary artery calcifications. Calcification in the aortic valve and mitral valve annulus. Or lung cardiomegaly. Trace pericardial effusion. Mediastinum/Nodes: Thyroid gland is normal. No hilar or mediastinal adenopathy. Tiny hiatal hernia. Lungs/Pleura: Slight bilateral interstitial edema. Small bilateral pleural effusions, right greater than left. Upper Abdomen: Normal. Musculoskeletal: No chest wall abnormality. No acute or significant osseous findings. Review of the MIP images confirms the above findings. IMPRESSION: 1. No pulmonary emboli. 2. Cardiomegaly with mild interstitial edema and small bilateral pleural effusions, right greater than left. Findings are consistent with congestive heart failure. 3. Aortic atherosclerosis. Aortic Atherosclerosis (ICD10-I70.0). Electronically Signed   By: Lorriane Shire M.D.   On: 08/22/2019 19:52    Assessment and Plan:   New atrial fibrillation with rapid ventricular rate --Currently in atrial fibrillation.  Reports shortness of breath but otherwise no report of racing heart rate or palpitations.  Chronicity of atrial fibrillation unknown. --Currently rate controlled without a beta-blocker.  Current BP soft at 105/50.  Will defer from addition of any rate control agents at this time.  Goal ventricular rate below 110 bpm. --Check TSH.  Known history of hypothyroidism. --Started on Eliquis 5mg  BID by hospitalist. CHA2DS2VASc score of at least 4 (agex2,  HTN, female) with further risk factors pending labs and echo.  Agree with long-term OAC at discharge with Eliquis.  Recommend follow-up CBC as an outpatient within 2 weeks. --No current plan for DCCV given not therapeutically anticoagulated  and currently rate controlled.  Could consider outpatient DCCV once therapeutically anticoagulated for 4 weeks and if still in atrial fibrillation at that time. TEE would need to be performed before DCCV if cardioverted before therapeutically anticoagulated.   New onset heart failure (EF currently unknown) --Reports progressive shortness of breath with acute progression on 6/17 and in the setting of new onset atrial fibrillation as above.  Denies a previous history of heart failure; however, she is on PTA Lasix.  She does have a history of lymphedema with chronic/stable LE E reported.  Shortness of breath likely multifactorial in the setting of A. fib, volume retention, and COPD. --Consider that volume status likely aggravated by new onset Afib as above.  --Started on IV diuresis with improvement in her symptoms.  Continue IV diuresis as renal function tolerates and until euvolemic on exam.  She does appear to be approaching her baseline with improvement in sx and renal function since start of diuresis. Consider transition to po diuresis tomorrow. Net negative -600cc yesterday.  Daily BMET. Continue to monitor I's/O's, daily standing weights. --CHF education provided today.  Discussed recommendation for low-salt diet and fluid restriction under 2 L daily. --Echo ordered and pending to assess EF and rule out WMA or acute structural changes.  Further recommendations pending echo. If EF low or WMA, will need to consider further ischemic workup. HS Tn not consistent with ACS and EKG without acute ST/T changes. --Will need follow-up in the office.  HTN --BP currently well controlled to soft.  Continue to monitor.  Rate controlling agents have been held /deferred in the setting.  HLD --Continue statin.   Lymphedema --Chronic and stable.   COPD --Likely contributing to breathing issues.   Hypothyroidism --Pending updated TSH.  OSA --Recommend CPAP at night, given association between Afib and OSA.     Morbid obesity --Weight loss advised.   For questions or updates, please contact [place] Please consult [email or web address] for contact info under     Signed, Minus Breeding, MD  08/23/2019 1:29 PM    History and all data above reviewed.  Patient examined.  I agree with the findings as above.  Gets around in her house.  She takes care of her husband who has Alzheimer's.  She has minimal help with somebody coming in a few hours a week.  Her children provide some help.  Her niece orders their food for them.  She has not been out of her house in 3 years.  She gets around with a walker in the house.  She is morbidly obese and has chronic dyspnea.  However, she became acutely more short of breath and was noted by her help to be very short of breath when she was trying to take a shower came to the emergency room with findings as above.  She is in atrial fibrillation which is new for her.  She does not feel any palpitations and would know she is out of rhythm.  She does have any presyncope or syncope.  She is not describing chest discomfort.  She does not describe a cough fevers or chills.  The patient exam reveals COR: Irregular, 3 out of 6 apical systolic murmur heard best also radiating to right upper sternal border but somewhat to the axilla,  mid peaking, no diastolic,  Lungs: Decreased breath sounds without wheezing or crackles,  Abd: Positive bowel sounds, morbidly obese, Ext trace edema.  All available labs, radiology testing, previous records reviewed. Agree with documented assessment and plan. ATRIAL FIB: Duration of this is not clear.  We will control of rate control and use anticoagulation.  We could consider cardioversion in the future but there may be relatively little benefit to this if we can control the rate and her volume.  I see no contraindication anticoagulation.   ACUTE HF: This is probably multifactorial and related in some degree to the fibrillation.  Continue IV diuresis.  Echo is  pending.   MURMUR: She is not aware that she had this.  This will be evaluated with the echo above.    SOCIAL: She needs her caseworker social consult to see if there is help that she can get in the home.  She is actually asking for this evaluation at family conference.  I will defer to the primary team. Minus Breeding  1:42 PM  08/23/2019

## 2019-08-24 ENCOUNTER — Inpatient Hospital Stay (HOSPITAL_COMMUNITY)
Admit: 2019-08-24 | Discharge: 2019-08-24 | Disposition: A | Discharge status: Home / Self Care | Payer: Medicare HMO | Attending: Family Medicine | Admitting: Family Medicine

## 2019-08-24 DIAGNOSIS — I5031 Acute diastolic (congestive) heart failure: Secondary | ICD-10-CM

## 2019-08-24 LAB — CBC
HCT: 36.6 % (ref 36.0–46.0)
Hemoglobin: 12.6 g/dL (ref 12.0–15.0)
MCH: 31.1 pg (ref 26.0–34.0)
MCHC: 34.4 g/dL (ref 30.0–36.0)
MCV: 90.4 fL (ref 80.0–100.0)
Platelets: 217 10*3/uL (ref 150–400)
RBC: 4.05 MIL/uL (ref 3.87–5.11)
RDW: 13.2 % (ref 11.5–15.5)
WBC: 9.2 10*3/uL (ref 4.0–10.5)
nRBC: 0 % (ref 0.0–0.2)

## 2019-08-24 LAB — BASIC METABOLIC PANEL
Anion gap: 9 (ref 5–15)
BUN: 11 mg/dL (ref 8–23)
CO2: 29 mmol/L (ref 22–32)
Calcium: 8.6 mg/dL — ABNORMAL LOW (ref 8.9–10.3)
Chloride: 99 mmol/L (ref 98–111)
Creatinine, Ser: 0.95 mg/dL (ref 0.44–1.00)
GFR calc Af Amer: >60 mL/min (ref >60)
GFR calc non Af Amer: 57 mL/min — ABNORMAL LOW (ref >60)
Glucose, Bld: 110 mg/dL — ABNORMAL HIGH (ref 70–99)
Potassium: 3.7 mmol/L (ref 3.5–5.1)
Sodium: 137 mmol/L (ref 135–145)

## 2019-08-24 LAB — ECHOCARDIOGRAM COMPLETE
Height: 64 in
Weight: 6089.6 oz

## 2019-08-24 LAB — TROPONIN I (HIGH SENSITIVITY): Troponin I (High Sensitivity): 37 ng/L — ABNORMAL HIGH (ref <18)

## 2019-08-24 NOTE — Progress Notes (Signed)
Progress Note  Patient Name: [name] [facility] Date of Encounter: 08/24/2019  Primary Cardiologist:   No primary care provider on file.   Subjective   Breathing better.  No pain  Inpatient Medications    Scheduled Meds: . apixaban  5 mg Oral BID  . atorvastatin  20 mg Oral QHS  . benzonatate  100 mg Oral TID  . calcium-vitamin D  1 tablet Oral QPC lunch  . clotrimazole  1 application Topical BID  . furosemide  40 mg Intravenous Q12H  . ipratropium-albuterol  3 mL Nebulization QID  . levothyroxine  125 mcg Oral QAC breakfast  . nystatin cream   Topical BID  . potassium chloride SA  20 mEq Oral BID  . sodium chloride flush  3 mL Intravenous Q12H   Continuous Infusions: . sodium chloride     PRN Meds: sodium chloride, acetaminophen, ALPRAZolam, cyclobenzaprine, docusate sodium, ondansetron (ZOFRAN) IV, sodium chloride flush   Vital Signs    Vitals:   08/23/19 2046 08/23/19 2103 08/24/19 0301 08/24/19 0821  BP:  111/70 110/70 101/68  Pulse:  99 78 67  Resp:  20 20 18   Temp:  98.1 F (36.7 C) 98.4 F (36.9 C) 98.2 F (36.8 C)  TempSrc:   Oral Oral  SpO2: 96% 98% 94% 95%  Weight:   (!) 172.6 kg   Height:        Intake/Output Summary (Last 24 hours) at 08/24/2019 0914 Last data filed at 08/24/2019 0553 Gross per 24 hour  Intake 480 ml  Output 2000 ml  Net -1520 ml   Filed Weights   08/22/19 1250 08/23/19 1900 08/24/19 0301  Weight: (!) 181.4 kg (!) 174.4 kg (!) 172.6 kg    Telemetry    Atrial fib with controlled ventricular rate.  Ventricular ectopy noted.  - Personally Reviewed  ECG    NA - Personally Reviewed  Physical Exam   GEN: No acute distress.   Neck: No  JVD Cardiac: Irregular RR, systolic murmur no rubs, or gallops.  Respiratory:   Few basilar crackles GI: Soft, nontender, non-distended  MS: No  edema; No deformity. Neuro:  Nonfocal  Psych: Normal affect   Labs    Chemistry Recent Labs  Lab 08/22/19 1254 08/23/19 0541  08/24/19 0531  NA 134* 137 137  K 4.3 3.7 3.7  CL 99 99 99  CO2 24 29 29   GLUCOSE 140* 112* 110*  BUN 12 11 11   CREATININE 1.05* 0.93 0.95  CALCIUM 9.5 8.8* 8.6*  GFRNONAA 51* 59* 57*  GFRAA 59* >60 >60  ANIONGAP 11 9 9      Hematology Recent Labs  Lab 08/22/19 1254 08/23/19 1121 08/24/19 0531  WBC 11.9* 9.7 9.2  RBC 4.70 4.38 4.05  HGB 14.9 13.8 12.6  HCT 42.7 40.5 36.6  MCV 90.9 92.5 90.4  MCH 31.7 31.5 31.1  MCHC 34.9 34.1 34.4  RDW 12.9 13.0 13.2  PLT 247 231 217    Cardiac EnzymesNo results for input(s): TROPONINI in the last 168 hours. No results for input(s): TROPIPOC in the last 168 hours.   BNP Recent Labs  Lab 08/22/19 1254  BNP 490.4*     DDimer No results for input(s): DDIMER in the last 168 hours.   Radiology    DG Chest 2 View  Result Date: 08/22/2019 CLINICAL DATA:  Dyspnea. EXAM: CHEST - 2 VIEW COMPARISON:  None. FINDINGS: Very mild, hazy right basilar atelectasis and/or infiltrate is noted. There is a small left  pleural effusion. No pneumothorax is identified. The cardiac silhouette is mildly enlarged and unchanged in size. There is mild calcification of the aortic arch. Multilevel degenerative changes seen throughout the thoracic spine. IMPRESSION: 1. Very mild, hazy right basilar atelectasis and/or infiltrate. 2. Small left pleural effusion. Electronically Signed   By: Virgina Norfolk M.D.   On: 08/22/2019 19:05   CT Angio Chest PE W and/or Wo Contrast  Result Date: 08/22/2019 CLINICAL DATA:  Shortness of breath. EXAM: CT ANGIOGRAPHY CHEST WITH CONTRAST TECHNIQUE: Multidetector CT imaging of the chest was performed using the standard protocol during bolus administration of intravenous contrast. Multiplanar CT image reconstructions and MIPs were obtained to evaluate the vascular anatomy. CONTRAST:  159mL OMNIPAQUE IOHEXOL 350 MG/ML SOLN COMPARISON:  Chest x-ray dated 08/22/2019 FINDINGS: Cardiovascular: No pulmonary emboli. Aortic atherosclerosis.  Coronary artery calcifications. Calcification in the aortic valve and mitral valve annulus. Or lung cardiomegaly. Trace pericardial effusion. Mediastinum/Nodes: Thyroid gland is normal. No hilar or mediastinal adenopathy. Tiny hiatal hernia. Lungs/Pleura: Slight bilateral interstitial edema. Small bilateral pleural effusions, right greater than left. Upper Abdomen: Normal. Musculoskeletal: No chest wall abnormality. No acute or significant osseous findings. Review of the MIP images confirms the above findings. IMPRESSION: 1. No pulmonary emboli. 2. Cardiomegaly with mild interstitial edema and small bilateral pleural effusions, right greater than left. Findings are consistent with congestive heart failure. 3. Aortic atherosclerosis. Aortic Atherosclerosis (ICD10-I70.0). Electronically Signed   By: Lorriane Shire M.D.   On: 08/22/2019 19:52    Cardiac Studies   ECHO:  Pending final report.  NL LV function.  Looks like severe AS and some MR.    Patient Profile     79 y.o. female with a hx of HTN, HLD, lymphedema, COPD, hypothyroidism, GERD, OSA, OA, and morbid obesity, and who is being seen for the evaluation of suspected heart failure at the request of Dr. Sidney Ace.  Assessment & Plan    ATRIAL FIB:  New onset.  Rate is controlled on current meds.  On Eliquis.    ACUTE CHF:  Echo pending.  Net negative 2.1 liters.  Continue current diuresis.    MURMUR:  Prelim readings look like severe AS.  Final report pending.    For questions or updates, please contact [place] Please consult [email or web address] for contact info under Cardiology/STEMI.   Signed, Minus Breeding, MD  08/24/2019, 9:14 AM

## 2019-08-24 NOTE — Progress Notes (Signed)
PROGRESS NOTE    [name]  BOF:[id number] DOB: January 06, 1941 DOA: 08/22/2019 PCP: Orvis Brill, [facility]    Assessment & Plan:   Active Problems:   Acute CHF (congestive heart failure) (HCC)  Acute CHF exacerbation: new onset. Unknown systolic vs diastolic vs mixed. Echo pending. Continue on IV lasix. Monitor I/Os & daily weights. Cardio following and recs apprec   Atrial fibrillation: new onset. Rate controlled. CHA2DS2-VASc score is 5, so started on eliquis. Continue on tele.  . Chest pain: likely secondary to atrial fibrillation. Troponins are minimally elevated. Resolved. Cardio following and recs apprec   COPD: w/o acute exacerbation. Continue on advair & breo  Dyslipidemia continue statin  Hypothyroidism: continue on levothyroxine   Morbid obesity: BMI 66.5. Would benefit from significant weight loss  DVT prophylaxis: eliquis Code Status: full  Family Communication: Disposition Plan: depends on PT/OT recs   Consultants:   cardio   Procedures:    Antimicrobials:    Subjective: Pt c/o shortness of breath slightly improved from day prior  Objective: Vitals:   08/23/19 1900 08/23/19 2046 08/23/19 2103 08/24/19 0301  BP:   111/70 110/70  Pulse:   99 78  Resp:   20 20  Temp:   98.1 F (36.7 C) 98.4 F (36.9 C)  TempSrc:    Oral  SpO2:  96% 98% 94%  Weight: (!) 174.4 kg   (!) 172.6 kg  Height: 5\' 4"  (1.626 m)       Intake/Output Summary (Last 24 hours) at 08/24/2019 0743 Last data filed at 08/24/2019 0553 Gross per 24 hour  Intake 480 ml  Output 2000 ml  Net -1520 ml   Filed Weights   08/22/19 1250 08/23/19 1900 08/24/19 0301  Weight: (!) 181.4 kg (!) 174.4 kg (!) 172.6 kg    Examination:  General exam: Appears calm and comfortable  Respiratory system: decreased breath sounds b/l. No wheezes Cardiovascular system: irregularly irregular. No rubs, gallops or clicks.  Gastrointestinal system: Abdomen is obese, soft and nontender.  Hypoactive bowel sounds heard. Central nervous system: Alert and oriented. Moves all 4 extremities  Psychiatry: Judgement and insight appear normal. Mood & affect appropriate.     Data Reviewed: I have personally reviewed following labs and imaging studies  CBC: Recent Labs  Lab 08/22/19 1254 08/23/19 1121 08/24/19 0531  WBC 11.9* 9.7 9.2  HGB 14.9 13.8 12.6  HCT 42.7 40.5 36.6  MCV 90.9 92.5 90.4  PLT 247 231 778   Basic Metabolic Panel: Recent Labs  Lab 08/22/19 1254 08/22/19 1850 08/23/19 0541 08/24/19 0531  NA 134*  --  137 137  K 4.3  --  3.7 3.7  CL 99  --  99 99  CO2 24  --  29 29  GLUCOSE 140*  --  112* 110*  BUN 12  --  11 11  CREATININE 1.05*  --  0.93 0.95  CALCIUM 9.5  --  8.8* 8.6*  MG  --  1.7  --   --    GFR: Estimated Creatinine Clearance: 78.5 mL/min (by C-G formula based on SCr of 0.95 mg/dL). Liver Function Tests: No results for input(s): AST, ALT, ALKPHOS, BILITOT, PROT, ALBUMIN in the last 168 hours. No results for input(s): LIPASE, AMYLASE in the last 168 hours. No results for input(s): AMMONIA in the last 168 hours. Coagulation Profile: No results for input(s): INR, PROTIME in the last 168 hours. Cardiac Enzymes: No results for input(s): CKTOTAL, CKMB, CKMBINDEX, TROPONINI in the last 168 hours.  BNP (last 3 results) No results for input(s): PROBNP in the last 8760 hours. HbA1C: No results for input(s): HGBA1C in the last 72 hours. CBG: No results for input(s): GLUCAP in the last 168 hours. Lipid Profile: No results for input(s): CHOL, HDL, LDLCALC, TRIG, CHOLHDL, LDLDIRECT in the last 72 hours. Thyroid Function Tests: Recent Labs    08/22/19 1850  TSH 1.973   Anemia Panel: No results for input(s): VITAMINB12, FOLATE, FERRITIN, TIBC, IRON, RETICCTPCT in the last 72 hours. Sepsis Labs: No results for input(s): PROCALCITON, LATICACIDVEN in the last 168 hours.  Recent Results (from the past 240 hour(s))  SARS Coronavirus 2 by RT  PCR (hospital order, performed in [facility] hospital lab) Nasopharyngeal Nasopharyngeal Swab     Status: None   Collection Time: 08/22/19  8:47 PM   Specimen: Nasopharyngeal Swab  Result Value Ref Range Status   SARS Coronavirus 2 NEGATIVE NEGATIVE Final    Comment: (NOTE) SARS-CoV-2 target nucleic acids are NOT DETECTED.  The SARS-CoV-2 RNA is generally detectable in upper and lower respiratory specimens during the acute phase of infection. The lowest concentration of SARS-CoV-2 viral copies this assay can detect is 250 copies / mL. A negative result does not preclude SARS-CoV-2 infection and should not be used as the sole basis for treatment or other patient management decisions.  A negative result may occur with improper specimen collection / handling, submission of specimen other than nasopharyngeal swab, presence of viral mutation(s) within the areas targeted by this assay, and inadequate number of viral copies (<250 copies / mL). A negative result must be combined with clinical observations, patient history, and epidemiological information.  Fact Sheet for Patients:   StrictlyIdeas.no  Fact Sheet for Healthcare Providers: BankingDealers.co.za  This test is not yet approved or  cleared by the [place] FDA and has been authorized for detection and/or diagnosis of SARS-CoV-2 by FDA under an Emergency Use Authorization (EUA).  This EUA will remain in effect (meaning this test can be used) for the duration of the COVID-19 declaration under Section 564(b)(1) of the Act, 21 U.S.C. section 360bbb-3(b)(1), unless the authorization is terminated or revoked sooner.  Performed at [facility], [address] [place], [place] [address]          Radiology Studies: DG Chest 2 View  Result Date: 08/22/2019 CLINICAL DATA:  Dyspnea. EXAM: CHEST - 2 VIEW COMPARISON:  None. FINDINGS: Very mild, hazy right basilar  atelectasis and/or infiltrate is noted. There is a small left pleural effusion. No pneumothorax is identified. The cardiac silhouette is mildly enlarged and unchanged in size. There is mild calcification of the aortic arch. Multilevel degenerative changes seen throughout the thoracic spine. IMPRESSION: 1. Very mild, hazy right basilar atelectasis and/or infiltrate. 2. Small left pleural effusion. Electronically Signed   By: Virgina Norfolk M.D.   On: 08/22/2019 19:05   CT Angio Chest PE W and/or Wo Contrast  Result Date: 08/22/2019 CLINICAL DATA:  Shortness of breath. EXAM: CT ANGIOGRAPHY CHEST WITH CONTRAST TECHNIQUE: Multidetector CT imaging of the chest was performed using the standard protocol during bolus administration of intravenous contrast. Multiplanar CT image reconstructions and MIPs were obtained to evaluate the vascular anatomy. CONTRAST:  153mL OMNIPAQUE IOHEXOL 350 MG/ML SOLN COMPARISON:  Chest x-ray dated 08/22/2019 FINDINGS: Cardiovascular: No pulmonary emboli. Aortic atherosclerosis. Coronary artery calcifications. Calcification in the aortic valve and mitral valve annulus. Or lung cardiomegaly. Trace pericardial effusion. Mediastinum/Nodes: Thyroid gland is normal. No hilar or mediastinal adenopathy. Tiny hiatal  hernia. Lungs/Pleura: Slight bilateral interstitial edema. Small bilateral pleural effusions, right greater than left. Upper Abdomen: Normal. Musculoskeletal: No chest wall abnormality. No acute or significant osseous findings. Review of the MIP images confirms the above findings. IMPRESSION: 1. No pulmonary emboli. 2. Cardiomegaly with mild interstitial edema and small bilateral pleural effusions, right greater than left. Findings are consistent with congestive heart failure. 3. Aortic atherosclerosis. Aortic Atherosclerosis (ICD10-I70.0). Electronically Signed   By: Lorriane Shire M.D.   On: 08/22/2019 19:52        Scheduled Meds: . apixaban  5 mg Oral BID  . atorvastatin   20 mg Oral QHS  . benzonatate  100 mg Oral TID  . calcium-vitamin D  1 tablet Oral QPC lunch  . clotrimazole  1 application Topical BID  . furosemide  40 mg Intravenous Q12H  . ipratropium-albuterol  3 mL Nebulization QID  . levothyroxine  125 mcg Oral QAC breakfast  . nystatin cream   Topical BID  . potassium chloride SA  20 mEq Oral BID  . sodium chloride flush  3 mL Intravenous Q12H   Continuous Infusions: . sodium chloride       LOS: 2 days    Time spent: 30 mins     Wyvonnia Dusky, MD [facility] Pager [phone]  If 7PM-7AM, please contact night-coverage [email or web address] 08/24/2019, 7:43 AM

## 2019-08-25 DIAGNOSIS — I350 Nonrheumatic aortic (valve) stenosis: Secondary | ICD-10-CM

## 2019-08-25 DIAGNOSIS — I4891 Unspecified atrial fibrillation: Secondary | ICD-10-CM

## 2019-08-25 LAB — BASIC METABOLIC PANEL
Anion gap: 9 (ref 5–15)
BUN: 14 mg/dL (ref 8–23)
CO2: 30 mmol/L (ref 22–32)
Calcium: 8.8 mg/dL — ABNORMAL LOW (ref 8.9–10.3)
Chloride: 99 mmol/L (ref 98–111)
Creatinine, Ser: 0.93 mg/dL (ref 0.44–1.00)
GFR calc Af Amer: >60 mL/min (ref >60)
GFR calc non Af Amer: 59 mL/min — ABNORMAL LOW (ref >60)
Glucose, Bld: 102 mg/dL — ABNORMAL HIGH (ref 70–99)
Potassium: 3.8 mmol/L (ref 3.5–5.1)
Sodium: 138 mmol/L (ref 135–145)

## 2019-08-25 LAB — CBC
HCT: 39.5 % (ref 36.0–46.0)
Hemoglobin: 13.3 g/dL (ref 12.0–15.0)
MCH: 31.6 pg (ref 26.0–34.0)
MCHC: 33.7 g/dL (ref 30.0–36.0)
MCV: 93.8 fL (ref 80.0–100.0)
Platelets: 236 10*3/uL (ref 150–400)
RBC: 4.21 MIL/uL (ref 3.87–5.11)
RDW: 13.1 % (ref 11.5–15.5)
WBC: 8.2 10*3/uL (ref 4.0–10.5)
nRBC: 0 % (ref 0.0–0.2)

## 2019-08-25 MED ORDER — HEPARIN (PORCINE) 25000 UT/250ML-% IV SOLN
1500.0000 [IU]/h | INTRAVENOUS | Status: DC
  Administered 2019-08-25: 1500 [IU]/h via INTRAVENOUS
  Filled 2019-08-25: qty 250

## 2019-08-25 MED ORDER — HEPARIN BOLUS VIA INFUSION
5000.0000 [IU] | Freq: Once | INTRAVENOUS | Status: AC
  Administered 2019-08-25: 5000 [IU] via INTRAVENOUS
  Filled 2019-08-25: qty 5000

## 2019-08-25 NOTE — Progress Notes (Signed)
PROGRESS NOTE    [name]  TML:[id number] DOB: 01-06-1941 DOA: 08/22/2019 PCP: Orvis Brill, [facility]    Assessment & Plan:   Active Problems:   Acute CHF (congestive heart failure) (HCC)  Acute diastolic CHF exacerbation: new onset. Echo shows EF 68-12%, diastolic functions are indeterminate. Continue on IV lasix. Monitor I/Os & daily weights. Cardio following and recs apprec. Neg approx 2.8L   Severe aortic stenosis: as per echo. Management as per cardio   Atrial fibrillation: new onset. Rate controlled. CHA2DS2-VASc score is 5, so started on eliquis. Continue on tele.  . Chest pain: likely secondary to atrial fibrillation. Troponins are minimally elevated. Resolved. Cardio following and recs apprec   COPD: w/o acute exacerbation. Continue on advair & breo  Dyslipidemia continue statin  Hypothyroidism: continue on levothyroxine   Morbid obesity: BMI 66.5. Would benefit from significant weight loss  DVT prophylaxis: eliquis Code Status: full  Family Communication: Disposition Plan: depends on PT/OT recs   Consultants:   cardio   Procedures:    Antimicrobials:    Subjective: Pt c/o malaise  Objective: Vitals:   08/24/19 2041 08/25/19 0524 08/25/19 0745 08/25/19 0750  BP:  103/61 (!) 102/58   Pulse:  83 64   Resp:   20   Temp:  97.9 F (36.6 C) 97.9 F (36.6 C)   TempSrc:  Oral Oral   SpO2: 93% 95% 92% 94%  Weight:  (!) 170.5 kg    Height:        Intake/Output Summary (Last 24 hours) at 08/25/2019 0835 Last data filed at 08/25/2019 0749 Gross per 24 hour  Intake 960 ml  Output 3825 ml  Net -2865 ml   Filed Weights   08/23/19 1900 08/24/19 0301 08/25/19 0524  Weight: (!) 174.4 kg (!) 172.6 kg (!) 170.5 kg    Examination:  General exam: Appears calm and comfortable  Respiratory system: diminished breath sounds b/l. No rales Cardiovascular system: S1/S2+. No rubs, gallops or clicks.  Gastrointestinal system: Abdomen is obese,  soft and nontender. Hypoactive bowel sounds heard. Central nervous system: Alert and oriented. Moves all 4 extremities  Psychiatry: Judgement and insight appear normal. Mood & affect appropriate.     Data Reviewed: I have personally reviewed following labs and imaging studies  CBC: Recent Labs  Lab 08/22/19 1254 08/23/19 1121 08/24/19 0531 08/25/19 0620  WBC 11.9* 9.7 9.2 8.2  HGB 14.9 13.8 12.6 13.3  HCT 42.7 40.5 36.6 39.5  MCV 90.9 92.5 90.4 93.8  PLT 247 231 217 751   Basic Metabolic Panel: Recent Labs  Lab 08/22/19 1254 08/22/19 1850 08/23/19 0541 08/24/19 0531 08/25/19 0620  NA 134*  --  137 137 138  K 4.3  --  3.7 3.7 3.8  CL 99  --  99 99 99  CO2 24  --  29 29 30   GLUCOSE 140*  --  112* 110* 102*  BUN 12  --  11 11 14   CREATININE 1.05*  --  0.93 0.95 0.93  CALCIUM 9.5  --  8.8* 8.6* 8.8*  MG  --  1.7  --   --   --    GFR: Estimated Creatinine Clearance: 79.5 mL/min (by C-G formula based on SCr of 0.93 mg/dL). Liver Function Tests: No results for input(s): AST, ALT, ALKPHOS, BILITOT, PROT, ALBUMIN in the last 168 hours. No results for input(s): LIPASE, AMYLASE in the last 168 hours. No results for input(s): AMMONIA in the last 168 hours. Coagulation Profile: No results  for input(s): INR, PROTIME in the last 168 hours. Cardiac Enzymes: No results for input(s): CKTOTAL, CKMB, CKMBINDEX, TROPONINI in the last 168 hours. BNP (last 3 results) No results for input(s): PROBNP in the last 8760 hours. HbA1C: No results for input(s): HGBA1C in the last 72 hours. CBG: No results for input(s): GLUCAP in the last 168 hours. Lipid Profile: No results for input(s): CHOL, HDL, LDLCALC, TRIG, CHOLHDL, LDLDIRECT in the last 72 hours. Thyroid Function Tests: Recent Labs    08/22/19 1850  TSH 1.973   Anemia Panel: No results for input(s): VITAMINB12, FOLATE, FERRITIN, TIBC, IRON, RETICCTPCT in the last 72 hours. Sepsis Labs: No results for input(s): PROCALCITON,  LATICACIDVEN in the last 168 hours.  Recent Results (from the past 240 hour(s))  SARS Coronavirus 2 by RT PCR (hospital order, performed in [facility] hospital lab) Nasopharyngeal Nasopharyngeal Swab     Status: None   Collection Time: 08/22/19  8:47 PM   Specimen: Nasopharyngeal Swab  Result Value Ref Range Status   SARS Coronavirus 2 NEGATIVE NEGATIVE Final    Comment: (NOTE) SARS-CoV-2 target nucleic acids are NOT DETECTED.  The SARS-CoV-2 RNA is generally detectable in upper and lower respiratory specimens during the acute phase of infection. The lowest concentration of SARS-CoV-2 viral copies this assay can detect is 250 copies / mL. A negative result does not preclude SARS-CoV-2 infection and should not be used as the sole basis for treatment or other patient management decisions.  A negative result may occur with improper specimen collection / handling, submission of specimen other than nasopharyngeal swab, presence of viral mutation(s) within the areas targeted by this assay, and inadequate number of viral copies (<250 copies / mL). A negative result must be combined with clinical observations, patient history, and epidemiological information.  Fact Sheet for Patients:   StrictlyIdeas.no  Fact Sheet for Healthcare Providers: BankingDealers.co.za  This test is not yet approved or  cleared by the [place] FDA and has been authorized for detection and/or diagnosis of SARS-CoV-2 by FDA under an Emergency Use Authorization (EUA).  This EUA will remain in effect (meaning this test can be used) for the duration of the COVID-19 declaration under Section 564(b)(1) of the Act, 21 U.S.C. section 360bbb-3(b)(1), unless the authorization is terminated or revoked sooner.  Performed at [facility], [address] [place], [place] [address]          Radiology Studies: ECHOCARDIOGRAM COMPLETE  Result Date:  08/24/2019    ECHOCARDIOGRAM REPORT   Patient Name:   [name] Date of Exam: 08/24/2019 Medical Rec #:  [id number]          Height:       64.0 in Accession #:    [id number]         Weight:       400.6 lb Date of Birth:  [birth year]          BSA:          2.628 m Patient Age:    79 years           BP:           110/70 mmHg Patient Gender: F                  HR:           78 bpm. Exam Location:  [facility] Procedure: 2D Echo Indications:     Acute Diastolic CHF  History:  Patient has no prior history of Echocardiogram examinations.                  Signs/Symptoms:Murmur; Risk Factors:Hypertension and                  Dyslipidemia.  Sonographer:     L Thornton-Maynard Referring Phys:  [id number] [place] Diagnosing Phys: Ida Rogue MD IMPRESSIONS  1. Left ventricular ejection fraction, by estimation, is 60 to 65%. The left ventricle has normal function. The left ventricle has no regional wall motion abnormalities. There is moderate left ventricular hypertrophy. Left ventricular diastolic parameters are indeterminate.  2. Right ventricular systolic function is normal. The right ventricular size is normal. Tricuspid regurgitation signal is inadequate for assessing PA pressure.  3. 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 No evidence of mitral stenosis.  4. The aortic valve is heavily calcified. Severe aortic valve stenosis. Aortic valve mean gradient measures 48.0 mmHg. Aortic valve Vmax measures 4.49 m/s.  5. Rhythm suggestive of atrial fibrillation FINDINGS  Left Ventricle: Left ventricular ejection fraction, by estimation, is 60 to 65%. The left ventricle has normal function. The left ventricle has no regional wall motion abnormalities. The left ventricular internal cavity size was normal in size. There is  moderate left ventricular hypertrophy. Left ventricular diastolic parameters are indeterminate. Right Ventricle: The right ventricular size is normal. No increase in right  ventricular wall thickness. Right ventricular systolic function is normal. Tricuspid regurgitation signal is inadequate for assessing PA pressure. The tricuspid regurgitant velocity is 2.47 m/s, and with an assumed right atrial pressure of 10 mmHg, the estimated right ventricular systolic pressure is 78.2 mmHg. Left Atrium: Left atrial size was normal in size. Right Atrium: Right atrial size was normal in size. Pericardium: A small pericardial effusion is present. Mitral Valve: The mitral valve is rheumatic. Normal mobility of the mitral valve leaflets. Moderate mitral valve regurgitation. No evidence of mitral valve stenosis. MV peak gradient, 13.4 mmHg. The mean mitral valve gradient is 5.0 mmHg. Tricuspid Valve: The tricuspid valve is normal in structure. Tricuspid valve regurgitation is mild . No evidence of tricuspid stenosis. Aortic Valve: The aortic valve is normal in structure. Aortic valve regurgitation is not visualized. Severe aortic stenosis is present. Aortic valve mean gradient measures 48.0 mmHg. Aortic valve peak gradient measures 80.6 mmHg. Aortic valve area, by VTI measures 0.31 cm. Pulmonic Valve: The pulmonic valve was normal in structure. Pulmonic valve regurgitation is not visualized. No evidence of pulmonic stenosis. Aorta: The aortic root is normal in size and structure. Venous: The inferior vena cava is normal in size with greater than 50% respiratory variability, suggesting right atrial pressure of 3 mmHg. IAS/Shunts: No atrial level shunt detected by color flow Doppler.  LEFT VENTRICLE PLAX 2D LVIDd:         3.36 cm  Diastology LVIDs:         2.17 cm  LV e' lateral:   6.26 cm/s LV PW:         1.37 cm  LV E/e' lateral: 13.8 LV IVS:        2.61 cm  LV e' medial:    6.35 cm/s LVOT diam:     1.90 cm  LV E/e' medial:  13.6 LV SV:         28 LV SV Index:   11 LVOT Area:     2.84 cm  RIGHT VENTRICLE RV S prime:     7.45 cm/s TAPSE (M-mode):  1.9 cm LEFT ATRIUM             Index LA diam:         3.40 cm 1.29 cm/m LA Vol (A2C):   54.9 ml 20.89 ml/m LA Vol (A4C):   88.7 ml 33.75 ml/m LA Biplane Vol: 74.7 ml 28.42 ml/m  AORTIC VALVE AV Area (Vmax):    0.37 cm AV Area (Vmean):   0.37 cm AV Area (VTI):     0.31 cm AV Vmax:           449.00 cm/s AV Vmean:          317.000 cm/s AV VTI:            0.926 m AV Peak Grad:      80.6 mmHg AV Mean Grad:      48.0 mmHg LVOT Vmax:         58.70 cm/s LVOT Vmean:        40.900 cm/s LVOT VTI:          0.100 m LVOT/AV VTI ratio: 0.11  AORTA Ao Root diam: 2.80 cm MITRAL VALVE                TRICUSPID VALVE MV Area (PHT): 3.24 cm     TR Peak grad:   24.4 mmHg MV Peak grad:  13.4 mmHg    TR Vmax:        247.00 cm/s MV Mean grad:  5.0 mmHg MV Vmax:       1.83 m/s     SHUNTS MV Vmean:      96.6 cm/s    Systemic VTI:  0.10 m MV E velocity: 86.50 cm/s   Systemic Diam: 1.90 cm MV A velocity: 135.00 cm/s MV E/A ratio:  0.64 Ida Rogue MD Electronically signed by Ida Rogue MD Signature Date/Time: 08/24/2019/2:00:51 PM    Final         Scheduled Meds: . apixaban  5 mg Oral BID  . atorvastatin  20 mg Oral QHS  . benzonatate  100 mg Oral TID  . calcium-vitamin D  1 tablet Oral QPC lunch  . clotrimazole  1 application Topical BID  . furosemide  40 mg Intravenous Q12H  . ipratropium-albuterol  3 mL Nebulization QID  . levothyroxine  125 mcg Oral QAC breakfast  . nystatin cream   Topical BID  . potassium chloride SA  20 mEq Oral BID  . sodium chloride flush  3 mL Intravenous Q12H   Continuous Infusions: . sodium chloride       LOS: 3 days    Time spent: 33 mins     Wyvonnia Dusky, MD [facility] Pager [phone]  If 7PM-7AM, please contact night-coverage [email or web address] 08/25/2019, 8:35 AM

## 2019-08-25 NOTE — Progress Notes (Signed)
ANTICOAGULATION CONSULT NOTE - Initial Consult  Pharmacy Consult for Heparin Drip Indication: atrial fibrillation  Allergies  Allergen Reactions  . Morphine And Related   . Vicodin [Hydrocodone-Acetaminophen] Other (See Comments)    Reaction:  Raises pts BP     Patient Measurements: Height: 5\' 4"  (162.6 cm) Weight: (!) 170.5 kg (375 lb 12.8 oz) IBW/kg (Calculated) : 54.7 Heparin Dosing Weight: 100.2 kg  Vital Signs: Temp: 98.6 F (37 C) (06/21 1559) Temp Source: Oral (06/21 1559) BP: 101/54 (06/21 1559) Pulse Rate: 94 (06/21 1559)  Labs: Recent Labs    08/22/19 2047 08/23/19 0541 08/23/19 1121 08/23/19 1121 08/24/19 0531 08/25/19 0620  HGB  --   --  13.8   < > 12.6 13.3  HCT  --   --  40.5  --  36.6 39.5  PLT  --   --  231  --  217 236  CREATININE  --  0.93  --   --  0.95 0.93  TROPONINIHS 29* 35*  --   --  37*  --    < > = values in this interval not displayed.    Estimated Creatinine Clearance: 79.5 mL/min (by C-G formula based on SCr of 0.93 mg/dL).   Medical History: Past Medical History:  Diagnosis Date  . Asthma   . COPD (chronic obstructive pulmonary disease) ([place])   . Depression   . GERD (gastroesophageal reflux disease)   . Hyperlipidemia   . Hypertension   . Hypothyroidism   . Morbid obesity ([place])   . Osteoarthritis   . PMB (postmenopausal bleeding)   . Sleep apnea   . Vaginal atrophy     Assessment: Patient is a 79yo female that was started on Apixaban 5mg  bid (last dose 6/21 09:00) for afib. Patient is now transitioning over to Heparin infusion in preparation for a possible Heart Cath.  Goal of Therapy:  Heparin level 0.3-0.7 units/ml aPTT 66-102 seconds Monitor platelets by anticoagulation protocol: Yes   Plan:  Will initiate Heparin 5000 units IV bolus followed by infusion rate of 1500 units/hr beginning at 21:00. Will check aPTT and Heparin level 8 hours after starting Heparin drip. Will need to use aPTT to guide therapy until aPTT  and HL correlate. Daily CBC while on Heparin infusion.  Paulina Fusi, PharmD, BCPS 08/25/2019 6:44 PM

## 2019-08-25 NOTE — Progress Notes (Signed)
Progress Note  Patient Name: [name] [facility] Date of Encounter: 08/25/2019  Primary Cardiologist: New to [facility] - consult by Hochrein  Subjective   No chest pain, dyspnea, or palpitations. She lost her phone at some point and this has stressed her significantly. Documented UOP 2.6 L for the past 24 hours with a net 6.3 L for the admission. Weight 172.6-->170.5 kg. Renal function stable. 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  Inpatient Medications    Scheduled Meds: . apixaban  5 mg Oral BID  . atorvastatin  20 mg Oral QHS  . benzonatate  100 mg Oral TID  . calcium-vitamin D  1 tablet Oral QPC lunch  . clotrimazole  1 application Topical BID  . furosemide  40 mg Intravenous Q12H  . ipratropium-albuterol  3 mL Nebulization QID  . levothyroxine  125 mcg Oral QAC breakfast  . nystatin cream   Topical BID  . potassium chloride SA  20 mEq Oral BID  . sodium chloride flush  3 mL Intravenous Q12H   Continuous Infusions: . sodium chloride     PRN Meds: sodium chloride, acetaminophen, ALPRAZolam, cyclobenzaprine, docusate sodium, ondansetron (ZOFRAN) IV, sodium chloride flush   Vital Signs    Vitals:   08/25/19 0745 08/25/19 0750 08/25/19 1220 08/25/19 1233  BP: (!) 102/58   107/62  Pulse: 64   86  Resp: 20   17  Temp: 97.9 F (36.6 C)   97.9 F (36.6 C)  TempSrc: Oral     SpO2: 92% 94% 94% 94%  Weight:      Height:        Intake/Output Summary (Last 24 hours) at 08/25/2019 1505 Last data filed at 08/25/2019 1351 Gross per 24 hour  Intake 3 ml  Output 4125 ml  Net -4122 ml   Filed Weights   08/23/19 1900 08/24/19 0301 08/25/19 0524  Weight: (!) 174.4 kg (!) 172.6 kg (!) 170.5 kg    Telemetry    Afib, ventricular rates in the 60s to 90s bpm - Personally  Reviewed  ECG    No new tracings - Personally Reviewed  Physical Exam   GEN: No acute distress.   Neck: JVD difficult to assess secondary to body habitus. Cardiac: Irregularly irregular, III/VI systolic murmur at the RUSB, no rubs, or gallops.  Respiratory: Clear to auscultation bilaterally.  GI: Soft, nontender, non-distended.   MS: Bilateral lymphedema; No deformity. Neuro:  Alert and oriented x 3; Nonfocal.  Psych: Normal affect.  Labs    Chemistry Recent Labs  Lab 08/23/19 0541 08/24/19 0531 08/25/19 0620  NA 137 137 138  K 3.7 3.7 3.8  CL 99 99 99  CO2 29 29 30   GLUCOSE 112* 110* 102*  BUN 11 11 14   CREATININE 0.93 0.95 0.93  CALCIUM 8.8* 8.6* 8.8*  GFRNONAA 59* 57* 59*  GFRAA >60 >60 >60  ANIONGAP 9 9 9      Hematology Recent Labs  Lab 08/23/19 1121 08/24/19 0531 08/25/19 0620  WBC 9.7 9.2 8.2  RBC 4.38 4.05 4.21  HGB 13.8 12.6 13.3  HCT 40.5 36.6 39.5  MCV 92.5 90.4 93.8  MCH 31.5 31.1 31.6  MCHC 34.1 34.4 33.7  RDW 13.0 13.2 13.1  PLT 231 217 236    Cardiac EnzymesNo results for input(s): TROPONINI  in the last 168 hours. No results for input(s): TROPIPOC in the last 168 hours.   BNP Recent Labs  Lab 08/22/19 1254  BNP 490.4*     DDimer No results for input(s): DDIMER in the last 168 hours.   Radiology    CXR 08/22/2019: IMPRESSION: 1. Very mild, hazy right basilar atelectasis and/or infiltrate. 2. Small left pleural effusion. __________  CTA chest 08/22/2019: IMPRESSION: 1. No pulmonary emboli. 2. Cardiomegaly with mild interstitial edema and small bilateral pleural effusions, right greater than left. Findings are consistent with congestive heart failure. 3. Aortic atherosclerosis.   Cardiac Studies   2D echo 08/24/2019: 1. Left ventricular ejection fraction, by estimation, is 60 to 65%. The  left ventricle has normal function. The left ventricle has no regional  wall motion abnormalities. There is moderate left ventricular  hypertrophy.  Left ventricular diastolic parameters are indeterminate.  2. Right ventricular systolic function is normal. The right ventricular  size is normal. Tricuspid regurgitation signal is inadequate for assessing  PA pressure.  3. 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 No  evidence of mitral stenosis.  4. The aortic valve is heavily calcified. Severe aortic valve stenosis.  Aortic valve mean gradient measures 48.0 mmHg. Aortic valve Vmax measures 4.49 m/s.  5. Rhythm suggestive of atrial fibrillation  Patient Profile     79 y.o. female with history of HTN, HLD, COPD, lymphedema, hypothyroidism, OSA, morbid obesity, OA, and GERD who we are seeing for new onset Afib and severe aortic stenosis.   Assessment & Plan    1. New onset Afib: -She remains in Afib with ventricular rates are well controlled in the 60s to 90s bpm -Not currently on rate controlling medications, relative hypotension precludes addition of beta blocker  -CHADS2VASc at least 6 (CHF, HTN, age x 2, vascular disease, gender) -Stop Eliquis and place on heparin gtt in preparation for [facility] this admission given severe aortic stenosis  -TSH normal -Lytes at goal  2. Acute diastolic CHF: -Volume status is difficult to assess secondary to body habitus  -IV Lasix 40 mg bid with KCl repletion  -Strict I/O -Daily weights  3. Severe aortic stenosis/moderate mitral regurgitation: -She will need a R/LHC this week prior to discharge, will plan for 08/27/2019 -Stop Eliquis and place on heparin gtt as above -Further recommendations pending cardiac cath -Risks and benefits of cardiac catheterization have been discussed with the patient including risks of bleeding, bruising, infection, kidney damage, stroke, heart attack, and death. The patient understands these risks and is willing to proceed with the procedure. All questions have been answered and concerns listened to  4. Elevated  HS-Tn: -Mildly elevated with an initial value of 30 with a delta of 29 and peaking at 35 -Not consistent with ACS -Likely supply demand ischemia in the setting of the above -Will need R/LHC as above given aortic stenosis   5. HLD: -PTA Lipitor  -Check lipid panel in the morning for further risk stratification    For questions or updates, please contact [place] Please consult [email or web address] for contact info under Cardiology/STEMI.    Signed, Christell Faith, PA-C [employer] Pager: [phone] 08/25/2019, 3:05 PM

## 2019-08-25 NOTE — Care Management Important Message (Signed)
Important Message  Patient Details  Name: [name] MRN: [id number] Date of Birth: 07/30/1940   Medicare Important Message Given:  N/A - LOS <3 / Initial given by admissions     Juliann Pulse A [name] 08/25/2019, 8:10 AM

## 2019-08-25 NOTE — Evaluation (Signed)
Occupational Therapy Evaluation Patient Details Name: [name] [facility] MRN: [id number] DOB: Nov 06, 1940 Today's Date: 08/25/2019    History of Present Illness Pt is a 79yo female came to ED on 08/22/19 acute onset of worsening dyspnea with associated orthopnea and lower extremity edema over the last several days.  Admitted for acute CHF exacerbation and new onset Afib w/ RVR. PMHx includes CHF, COPD, dyslipidemia, hyporthyroidism, HTN, GERD, OSA, OA, and morbid obesity.   Clinical Impression   Pt was seen for OT evaluation this date. Prior to hospital admission, pt and spouse were "doing the best we can caring for each other." Pt/spouse live in 1 story home with ramped entrance. Pt sleeps and sits in lift recliner for majority of the day (using overnight pads and towels for urinating in recliner, only getting out of recliner for bowel movements on BSC over top std commode). She reports her spouse provided physical assist to get out of lift recliner when fully elevated for all standing attempts. Pt using bari RW at home for all short mobility attempts. Pt endorses not having left the home for over 12 years 2/2 no access to transportation that can accommodate her power w/c. Pt reports spouse does light housekeeping tasks, laundry, and light meal prep. Also drives "but he shouldn't" (pt reports spouse has dementia). Pt has an aide come to home 3days/wk for 3 hours/day to assist with meal prep and bathing primarily. Niece lives close by and provides meals and facilitates groceries delivered to pt's home.   Currently pt demonstrates impairments as described below (See OT problem list) which functionally limit her ability to perform ADL/self-care tasks. Pt currently requires +2 assist for all ADL transfer attempts and ADL mobility. Significant difficulty performing bed mobility and transfers. Heavy reliance on bed rails and RW for support. HR up to 120's - 140's briefly with effort, ~100 at rest. Pt educated in  ECS, pursed lip breathing, falls prevention to support safety and participation in ADL tasks.  Pt would benefit from skilled OT to address noted impairments and functional limitations (see below for any additional details) in order to maximize safety and independence while minimizing falls risk and caregiver burden. Upon hospital discharge, recommend STR to maximize pt safety and return to PLOF. Also suggested pt consider alternative long term living arrangements to improve her and spouse's safety given deficits. Pt verbalized understanding and appreciative of education and assist provided today.     Follow Up Recommendations  SNF    Equipment Recommendations  None recommended by OT    Recommendations for Other Services       Precautions / Restrictions Precautions Precautions: Fall Precaution Comments: watch HR/O2 Restrictions Weight Bearing Restrictions: No      Mobility Bed Mobility Overal bed mobility: Needs Assistance Bed Mobility: Supine to Sit;Sit to Supine     Supine to sit: Mod assist;HOB elevated Sit to supine: Mod assist;+2 for physical assistance   General bed mobility comments: significant effort and use of bilat bed rails  Transfers Overall transfer level: Needs assistance Equipment used: Rolling walker (2 wheeled) Transfers: Sit to/from Stand;Lateral/Scoot Transfers Sit to Stand: Max assist;+2 physical assistance;From elevated surface        Lateral/Scoot Transfers: Min assist;+2 physical assistance;From elevated surface General transfer comment: Multiple attempts with +1 assist but unsuccessful. VC for hand placement/RW mgt, despite scooting forward, required some anterior/posterior weight shifting and for therapist to count "1, 2, 3, go" prior to attempts and ultimately able to perform STS t/f with +2 Max  A    Balance Overall balance assessment: Needs assistance Sitting-balance support: No upper extremity supported;Feet supported Sitting balance-Leahy  Scale: Good     Standing balance support: Bilateral upper extremity supported Standing balance-Leahy Scale: Poor Standing balance comment: heavy BUE support on RW required                           ADL either performed or assessed with clinical judgement   ADL Overall ADL's : Needs assistance/impaired         Upper Body Bathing: Sitting;Moderate assistance;Minimal assistance   Lower Body Bathing: Sit to/from stand;Maximal assistance   Upper Body Dressing : Sitting;Minimal assistance   Lower Body Dressing: Sit to/from stand;Maximal assistance   Toilet Transfer: Maximal assistance;BSC;Ambulation;+2 for physical assistance;RW                   Vision Baseline Vision/History: Wears glasses Wears Glasses: At all times Patient Visual Report: No change from baseline       Perception     Praxis      Pertinent Vitals/Pain Pain Assessment: No/denies pain     Hand Dominance Right   Extremity/Trunk Assessment Upper Extremity Assessment Upper Extremity Assessment: Generalized weakness   Lower Extremity Assessment Lower Extremity Assessment: Generalized weakness       Communication Communication Communication: No difficulties   Cognition Arousal/Alertness: Awake/alert Behavior During Therapy: WFL for tasks assessed/performed Overall Cognitive Status: Within Functional Limits for tasks assessed                                     General Comments  HR ~100bpm at rest, up to 120's-143 briefly with bed mobility and ADL transfers, recovers quickly    Exercises Other Exercises Other Exercises: Pt educated in bed mobility and fxl t/f training, ECS, PLB, and falls prevention strategies   Shoulder Instructions      Home Living Family/patient expects to be discharged to:: Private residence Living Arrangements: Spouse/significant other (spouse has alzheimers, pt report she still drives and indep w/ basic ADL, needs assist with meds,  safety) Available Help at Discharge: Family;Available PRN/intermittently Type of Home: House Home Access: Ramped entrance     Home Layout: One level     Bathroom Shower/Tub: Occupational psychologist: Handicapped height (BSC over toilet)     Home Equipment: Walker - 2 wheels;Bedside commode;Wheelchair - power;Shower seat;Grab bars - tub/shower;Grab bars - toilet;Hand held shower head          Prior Functioning/Environment Level of Independence: Needs assistance  Gait / Transfers Assistance Needed: ambulates w/ RW,  needs assist from spouse to get out of lift recliner; has power w/c but too big for in home use ADL's / Homemaking Assistance Needed: sleeps in recliner, uses pads & towels between legs to urinate from recliner, uses BSC over toilet for BMs. Eats meals out of cans typically, manages meds for herself and spouse; niece helps prep meals, calls groceries in and delivered, re-organizes clutter that pt's spouse leaves, aide comes 3d/wk, 3 hrs/day (M, W, F) to prep pre-prepared meals, assists w/ showering; spouse does some cleaning and laundry   Comments: Pt reports having been homebound for past 12years with no access to transportation with w/c lift; no falls in past 12 months        OT Problem List: Decreased strength;Obesity;Decreased activity tolerance;Impaired balance (sitting and/or standing);Decreased knowledge  of use of DME or AE;Cardiopulmonary status limiting activity      OT Treatment/Interventions: Self-care/ADL training;Therapeutic exercise;Therapeutic activities;Energy conservation;DME and/or AE instruction;Patient/family education;Balance training    OT Goals(Current goals can be found in the care plan section) Acute Rehab OT Goals Patient Stated Goal: to not be so short of breath OT Goal Formulation: With patient Time For Goal Achievement: 09/08/19 Potential to Achieve Goals: Good ADL Goals Pt Will Transfer to Toilet: bedside commode;ambulating;with  min assist (LRAD for amb, Min A x1) Pt Will Perform Toileting - Clothing Manipulation and hygiene: with set-up;with min guard assist;sit to/from stand Additional ADL Goal #1: Pt will perform bed mobility with Min A in preparation for EOB ADL tasks. Additional ADL Goal #2: Pt will utilize learned ECS/breath recovery strategies with PRN VC during ADL and ADL mobility to minimize SOB.  OT Frequency: Min 1X/week   Barriers to D/C: Decreased caregiver support          Co-evaluation              AM-PAC OT "6 Clicks" Daily Activity     Outcome Measure Help from another person eating meals?: None Help from another person taking care of personal grooming?: None Help from another person toileting, which includes using toliet, bedpan, or urinal?: Total Help from another person bathing (including washing, rinsing, drying)?: A Lot Help from another person to put on and taking off regular upper body clothing?: A Lot Help from another person to put on and taking off regular lower body clothing?: A Lot 6 Click Score: 15   End of Session Equipment Utilized During Treatment: Gait belt;Rolling walker  Activity Tolerance: Patient tolerated treatment well Patient left: in bed;with call bell/phone within reach;with bed alarm set  OT Visit Diagnosis: Other abnormalities of gait and mobility (R26.89);Muscle weakness (generalized) (M62.81)                Time: 6861-6837 OT Time Calculation (min): 55 min Charges:  OT General Charges $OT Visit: 1 Visit OT Evaluation $OT Eval Moderate Complexity: 1 Mod OT Treatments $Therapeutic Activity: 38-52 mins  Jeni Salles, MPH, MS, OTR/L ascom [phone] 08/25/19, 5:30 PM

## 2019-08-26 DIAGNOSIS — Z515 Encounter for palliative care: Secondary | ICD-10-CM

## 2019-08-26 DIAGNOSIS — Z7189 Other specified counseling: Secondary | ICD-10-CM

## 2019-08-26 LAB — CBC
HCT: 40 % (ref 36.0–46.0)
Hemoglobin: 14 g/dL (ref 12.0–15.0)
MCH: 31.7 pg (ref 26.0–34.0)
MCHC: 35 g/dL (ref 30.0–36.0)
MCV: 90.5 fL (ref 80.0–100.0)
Platelets: 228 10*3/uL (ref 150–400)
RBC: 4.42 MIL/uL (ref 3.87–5.11)
RDW: 13.1 % (ref 11.5–15.5)
WBC: 8.9 10*3/uL (ref 4.0–10.5)
nRBC: 0 % (ref 0.0–0.2)

## 2019-08-26 LAB — BASIC METABOLIC PANEL
Anion gap: 9 (ref 5–15)
BUN: 17 mg/dL (ref 8–23)
CO2: 30 mmol/L (ref 22–32)
Calcium: 9 mg/dL (ref 8.9–10.3)
Chloride: 99 mmol/L (ref 98–111)
Creatinine, Ser: 0.96 mg/dL (ref 0.44–1.00)
GFR calc Af Amer: >60 mL/min (ref >60)
GFR calc non Af Amer: 57 mL/min — ABNORMAL LOW (ref >60)
Glucose, Bld: 106 mg/dL — ABNORMAL HIGH (ref 70–99)
Potassium: 3.8 mmol/L (ref 3.5–5.1)
Sodium: 138 mmol/L (ref 135–145)

## 2019-08-26 LAB — HEPARIN LEVEL (UNFRACTIONATED): Heparin Unfractionated: 2 IU/mL — ABNORMAL HIGH (ref 0.30–0.70)

## 2019-08-26 LAB — APTT: aPTT: >160 seconds (ref 24–36)

## 2019-08-26 MED ORDER — APIXABAN 5 MG PO TABS
5.0000 mg | ORAL_TABLET | Freq: Two times a day (BID) | ORAL | Status: DC
  Administered 2019-08-26 – 2019-08-28 (×5): 5 mg via ORAL
  Filled 2019-08-26 (×5): qty 1

## 2019-08-26 MED ORDER — FUROSEMIDE 40 MG PO TABS
40.0000 mg | ORAL_TABLET | Freq: Two times a day (BID) | ORAL | Status: DC
  Administered 2019-08-26 – 2019-08-28 (×5): 40 mg via ORAL
  Filled 2019-08-26 (×4): qty 1

## 2019-08-26 NOTE — Progress Notes (Addendum)
PROGRESS NOTE    [name] [facility]  ACZ:[id number] DOB: 1940-12-16 DOA: 08/22/2019 PCP: Orvis Brill, [facility]    Assessment & Plan:   Active Problems:   Critical aortic valve stenosis   Acute CHF (congestive heart failure) (HCC)   Atrial fibrillation (HCC)  Acute diastolic CHF exacerbation: new onset. Echo shows EF 10-93%, diastolic functions are indeterminate. Continue on asix. Monitor I/Os & daily weights. Cardio following and recs apprec. Neg approx 2.6L   Severe critical aortic stenosis: needs a left & right heart cath but refuses wants to proceed w/ palliative care/hospice. Unlikely aortic valve replacement candidate. Palliative care was consulted   Atrial fibrillation: new onset. Rate controlled. CHA2DS2-VASc score is 5, so started on eliquis. Continue on tele.  . Chest pain: likely secondary to atrial fibrillation. Troponins are minimally elevated. Resolved. Cardio following and recs apprec   COPD: w/o acute exacerbation. Continue on advair & breo  Dyslipidemia continue statin  Hypothyroidism: continue on levothyroxine   Morbid obesity: BMI 66.5. Would benefit from significant weight loss  DVT prophylaxis: eliquis Code Status: full  Family Communication: please call pt's son, Jozette Castrellon [phone]) w/ updates. Discussed pt's care/prognosis w/ pt's son, Sharen Heck, answered his questions Disposition Plan: depends on pt's discussion w/ palliative care   Status is: Inpatient  Remains inpatient appropriate because:Unsafe d/c plan. Awaiting for palliative care to see & evaluate pt and discuss w/ pt's son, Sharen Heck    Dispo: The patient is from: Home              Anticipated d/c is to: Home w/ palliative care/hospice               Anticipated d/c date is: 2 days              Patient currently is not medically stable to d/c.     Consultants:   Cardio  Palliative care    Procedures:    Antimicrobials:    Subjective: Pt c/o shortness of breath    Objective: Vitals:   08/25/19 1936 08/25/19 2000 08/26/19 0542 08/26/19 0746  BP: 123/71  (!) 106/57 (!) 103/47  Pulse: (!) 45  89 62  Resp: 19 17 18 16   Temp: 98.1 F (36.7 C)  98.3 F (36.8 C) 97.7 F (36.5 C)  TempSrc: Oral  Oral   SpO2: 94%  93% 94%  Weight:   (!) 171.4 kg   Height:        Intake/Output Summary (Last 24 hours) at 08/26/2019 0806 Last data filed at 08/26/2019 0300 Gross per 24 hour  Intake 303.72 ml  Output 3000 ml  Net -2696.28 ml   Filed Weights   08/24/19 0301 08/25/19 0524 08/26/19 0542  Weight: (!) 172.6 kg (!) 170.5 kg (!) 171.4 kg    Examination:  General exam: Appears calm and comfortable  Respiratory system: decreased breath sounds b/l. No wheezes Cardiovascular system: S1/S2+. No rubs, gallops or clicks.  Gastrointestinal system: Abdomen is obese, soft and nontender. Normal bowel sounds heard. Central nervous system: Alert and oriented. Moves all 4 extremities  Psychiatry: Judgement and insight appear normal. Mood & affect appropriate.     Data Reviewed: I have personally reviewed following labs and imaging studies  CBC: Recent Labs  Lab 08/22/19 1254 08/23/19 1121 08/24/19 0531 08/25/19 0620 08/26/19 0601  WBC 11.9* 9.7 9.2 8.2 8.9  HGB 14.9 13.8 12.6 13.3 14.0  HCT 42.7 40.5 36.6 39.5 40.0  MCV 90.9 92.5 90.4 93.8 90.5  PLT 247 231  217 236 856   Basic Metabolic Panel: Recent Labs  Lab 08/22/19 1254 08/22/19 1850 08/23/19 0541 08/24/19 0531 08/25/19 0620 08/26/19 0601  NA 134*  --  137 137 138 138  K 4.3  --  3.7 3.7 3.8 3.8  CL 99  --  99 99 99 99  CO2 24  --  29 29 30 30   GLUCOSE 140*  --  112* 110* 102* 106*  BUN 12  --  11 11 14 17   CREATININE 1.05*  --  0.93 0.95 0.93 0.96  CALCIUM 9.5  --  8.8* 8.6* 8.8* 9.0  MG  --  1.7  --   --   --   --    GFR: Estimated Creatinine Clearance: 77.3 mL/min (by C-G formula based on SCr of 0.96 mg/dL). Liver Function Tests: No results for input(s): AST, ALT, ALKPHOS,  BILITOT, PROT, ALBUMIN in the last 168 hours. No results for input(s): LIPASE, AMYLASE in the last 168 hours. No results for input(s): AMMONIA in the last 168 hours. Coagulation Profile: No results for input(s): INR, PROTIME in the last 168 hours. Cardiac Enzymes: No results for input(s): CKTOTAL, CKMB, CKMBINDEX, TROPONINI in the last 168 hours. BNP (last 3 results) No results for input(s): PROBNP in the last 8760 hours. HbA1C: No results for input(s): HGBA1C in the last 72 hours. CBG: No results for input(s): GLUCAP in the last 168 hours. Lipid Profile: No results for input(s): CHOL, HDL, LDLCALC, TRIG, CHOLHDL, LDLDIRECT in the last 72 hours. Thyroid Function Tests: No results for input(s): TSH, T4TOTAL, FREET4, T3FREE, THYROIDAB in the last 72 hours. Anemia Panel: No results for input(s): VITAMINB12, FOLATE, FERRITIN, TIBC, IRON, RETICCTPCT in the last 72 hours. Sepsis Labs: No results for input(s): PROCALCITON, LATICACIDVEN in the last 168 hours.  Recent Results (from the past 240 hour(s))  SARS Coronavirus 2 by RT PCR (hospital order, performed in [facility] hospital lab) Nasopharyngeal Nasopharyngeal Swab     Status: None   Collection Time: 08/22/19  8:47 PM   Specimen: Nasopharyngeal Swab  Result Value Ref Range Status   SARS Coronavirus 2 NEGATIVE NEGATIVE Final    Comment: (NOTE) SARS-CoV-2 target nucleic acids are NOT DETECTED.  The SARS-CoV-2 RNA is generally detectable in upper and lower respiratory specimens during the acute phase of infection. The lowest concentration of SARS-CoV-2 viral copies this assay can detect is 250 copies / mL. A negative result does not preclude SARS-CoV-2 infection and should not be used as the sole basis for treatment or other patient management decisions.  A negative result may occur with improper specimen collection / handling, submission of specimen other than nasopharyngeal swab, presence of viral mutation(s) within the areas  targeted by this assay, and inadequate number of viral copies (<250 copies / mL). A negative result must be combined with clinical observations, patient history, and epidemiological information.  Fact Sheet for Patients:   StrictlyIdeas.no  Fact Sheet for Healthcare Providers: BankingDealers.co.za  This test is not yet approved or  cleared by the [place] FDA and has been authorized for detection and/or diagnosis of SARS-CoV-2 by FDA under an Emergency Use Authorization (EUA).  This EUA will remain in effect (meaning this test can be used) for the duration of the COVID-19 declaration under Section 564(b)(1) of the Act, 21 U.S.C. section 360bbb-3(b)(1), unless the authorization is terminated or revoked sooner.  Performed at [facility], [address] [place], [place] [address]  Radiology Studies: ECHOCARDIOGRAM COMPLETE  Result Date: 08/24/2019    ECHOCARDIOGRAM REPORT   Patient Name:   [name] Date of Exam: 08/24/2019 Medical Rec #:  [id number]          Height:       64.0 in Accession #:    [id number]         Weight:       400.6 lb Date of Birth:  03/21/1940          BSA:          2.628 m Patient Age:    7 years           BP:           110/70 mmHg Patient Gender: F                  HR:           78 bpm. Exam Location:  [facility] Procedure: 2D Echo Indications:     Acute Diastolic CHF  History:         Patient has no prior history of Echocardiogram examinations.                  Signs/Symptoms:Murmur; Risk Factors:Hypertension and                  Dyslipidemia.  Sonographer:     L Thornton-Maynard Referring Phys:  [id number] [place] Diagnosing Phys: Ida Rogue MD IMPRESSIONS  1. Left ventricular ejection fraction, by estimation, is 60 to 65%. The left ventricle has normal function. The left ventricle has no regional wall motion abnormalities. There is moderate left ventricular hypertrophy. Left ventricular  diastolic parameters are indeterminate.  2. Right ventricular systolic function is normal. The right ventricular size is normal. Tricuspid regurgitation signal is inadequate for assessing PA pressure.  3. 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 No evidence of mitral stenosis.  4. The aortic valve is heavily calcified. Severe aortic valve stenosis. Aortic valve mean gradient measures 48.0 mmHg. Aortic valve Vmax measures 4.49 m/s.  5. Rhythm suggestive of atrial fibrillation FINDINGS  Left Ventricle: Left ventricular ejection fraction, by estimation, is 60 to 65%. The left ventricle has normal function. The left ventricle has no regional wall motion abnormalities. The left ventricular internal cavity size was normal in size. There is  moderate left ventricular hypertrophy. Left ventricular diastolic parameters are indeterminate. Right Ventricle: The right ventricular size is normal. No increase in right ventricular wall thickness. Right ventricular systolic function is normal. Tricuspid regurgitation signal is inadequate for assessing PA pressure. The tricuspid regurgitant velocity is 2.47 m/s, and with an assumed right atrial pressure of 10 mmHg, the estimated right ventricular systolic pressure is 22.0 mmHg. Left Atrium: Left atrial size was normal in size. Right Atrium: Right atrial size was normal in size. Pericardium: A small pericardial effusion is present. Mitral Valve: The mitral valve is rheumatic. Normal mobility of the mitral valve leaflets. Moderate mitral valve regurgitation. No evidence of mitral valve stenosis. MV peak gradient, 13.4 mmHg. The mean mitral valve gradient is 5.0 mmHg. Tricuspid Valve: The tricuspid valve is normal in structure. Tricuspid valve regurgitation is mild . No evidence of tricuspid stenosis. Aortic Valve: The aortic valve is normal in structure. Aortic valve regurgitation is not visualized. Severe aortic stenosis is present. Aortic valve mean  gradient measures 48.0 mmHg. Aortic valve peak gradient measures 80.6 mmHg. Aortic valve area, by VTI measures 0.31 cm. Pulmonic Valve:  The pulmonic valve was normal in structure. Pulmonic valve regurgitation is not visualized. No evidence of pulmonic stenosis. Aorta: The aortic root is normal in size and structure. Venous: The inferior vena cava is normal in size with greater than 50% respiratory variability, suggesting right atrial pressure of 3 mmHg. IAS/Shunts: No atrial level shunt detected by color flow Doppler.  LEFT VENTRICLE PLAX 2D LVIDd:         3.36 cm  Diastology LVIDs:         2.17 cm  LV e' lateral:   6.26 cm/s LV PW:         1.37 cm  LV E/e' lateral: 13.8 LV IVS:        2.61 cm  LV e' medial:    6.35 cm/s LVOT diam:     1.90 cm  LV E/e' medial:  13.6 LV SV:         28 LV SV Index:   11 LVOT Area:     2.84 cm  RIGHT VENTRICLE RV S prime:     7.45 cm/s TAPSE (M-mode): 1.9 cm LEFT ATRIUM             Index LA diam:        3.40 cm 1.29 cm/m LA Vol (A2C):   54.9 ml 20.89 ml/m LA Vol (A4C):   88.7 ml 33.75 ml/m LA Biplane Vol: 74.7 ml 28.42 ml/m  AORTIC VALVE AV Area (Vmax):    0.37 cm AV Area (Vmean):   0.37 cm AV Area (VTI):     0.31 cm AV Vmax:           449.00 cm/s AV Vmean:          317.000 cm/s AV VTI:            0.926 m AV Peak Grad:      80.6 mmHg AV Mean Grad:      48.0 mmHg LVOT Vmax:         58.70 cm/s LVOT Vmean:        40.900 cm/s LVOT VTI:          0.100 m LVOT/AV VTI ratio: 0.11  AORTA Ao Root diam: 2.80 cm MITRAL VALVE                TRICUSPID VALVE MV Area (PHT): 3.24 cm     TR Peak grad:   24.4 mmHg MV Peak grad:  13.4 mmHg    TR Vmax:        247.00 cm/s MV Mean grad:  5.0 mmHg MV Vmax:       1.83 m/s     SHUNTS MV Vmean:      96.6 cm/s    Systemic VTI:  0.10 m MV E velocity: 86.50 cm/s   Systemic Diam: 1.90 cm MV A velocity: 135.00 cm/s MV E/A ratio:  0.64 Ida Rogue MD Electronically signed by Ida Rogue MD Signature Date/Time: 08/24/2019/2:00:51 PM    Final          Scheduled Meds: . atorvastatin  20 mg Oral QHS  . benzonatate  100 mg Oral TID  . calcium-vitamin D  1 tablet Oral QPC lunch  . clotrimazole  1 application Topical BID  . furosemide  40 mg Intravenous Q12H  . ipratropium-albuterol  3 mL Nebulization QID  . levothyroxine  125 mcg Oral QAC breakfast  . nystatin cream   Topical BID  . potassium chloride SA  20 mEq Oral BID  . sodium chloride  flush  3 mL Intravenous Q12H   Continuous Infusions: . sodium chloride    . heparin 1,500 Units/hr (08/25/19 2150)     LOS: 4 days    Time spent: 35 mins     Wyvonnia Dusky, MD [facility] Pager [phone]  If 7PM-7AM, please contact night-coverage [email or web address] 08/26/2019, 8:06 AM

## 2019-08-26 NOTE — Progress Notes (Signed)
Patient refusing to have heart cath after discussion with cardiology concerning prognosis. States she and her son has agreed to hospice.

## 2019-08-26 NOTE — Consult Note (Signed)
Consultation Note Date: 08/26/2019   Patient Name: [name] [facility]  DOB: Feb 14, 1941  MRN: [id number]  Age / Sex: 79 y.o., female  PCP: [facility] Referring Physician: Wyvonnia Dusky, MD  Reason for Consultation: Establishing goals of care  HPI/Patient Profile: [name]  is a 79 y.o. morbidly obese Caucasian female with a known history of asthma, COPD, hypertension, hypothyroidism, dyslipidemia and sleep apnea, who presented to the emergency room with acute onset of worsening dyspnea with associated orthopnea and lower extremity edema over the last several days.  Clinical Assessment and Goals of Care: Patient is resting in bed with chaplain at bedside. She states she has 3 children. She states son Sharen Heck helps she and her husband the most. She states her husband has dementia and really needs facility placement himself. She states he cooks and cleans. She states he still drives. She tells me she has a nurse that comes in 3 days per week 3 hours per day.  She states at times in the past "he has been verbally abusive" due to the progressing dementia.   We discussed her diagnosis, prognosis, GOC, EOL wishes disposition and options.  A detailed discussion was had today regarding advanced directives.  Concepts specific to code status, artifical feeding and hydration, IV antibiotics and rehospitalization were discussed.  The difference between an aggressive medical intervention path and a comfort care path was discussed.  Values and goals of care important to patient and family were attempted to be elicited.  Discussed limitations of medical interventions to prolong quality of life in some situations and discussed the concept of human mortality.  She states she has discussed her status and plans with the specialists and attending team. She states she wants to go home with hospice and focus on  comfort based care for what time she has left. She is not interested in coming back to the hospital.    SUMMARY OF RECOMMENDATIONS   Patient is appropriate for home with hospice as she has < 6 months prognosis.    Prognosis:   < 6 months      Primary Diagnoses: Present on Admission: **None**   I have reviewed the medical record, interviewed the patient and family, and examined the patient. The following aspects are pertinent.  Past Medical History:  Diagnosis Date  . Asthma   . COPD (chronic obstructive pulmonary disease) ([place])   . Depression   . GERD (gastroesophageal reflux disease)   . Hyperlipidemia   . Hypertension   . Hypothyroidism   . Morbid obesity ([place])   . Osteoarthritis   . PMB (postmenopausal bleeding)   . Sleep apnea   . Vaginal atrophy    Social History   Socioeconomic History  . Marital status: Married    Spouse name: Not on file  . Number of children: 3  . Years of education: Not on file  . Highest education level: Not on file  Occupational History  . Not on file  Tobacco Use  . Smoking status: Never Smoker  .  Smokeless tobacco: Never Used  Substance and Sexual Activity  . Alcohol use: No  . Drug use: No  . Sexual activity: Never  Other Topics Concern  . Not on file  Social History Narrative  . Not on file   Social Determinants of Health   Financial Resource Strain:   . Difficulty of Paying Living Expenses:   Food Insecurity:   . Worried About Charity fundraiser in the Last Year:   . Arboriculturist in the Last Year:   Transportation Needs:   . Film/video editor (Medical):   Marland Kitchen Lack of Transportation (Non-Medical):   Physical Activity:   . Days of Exercise per Week:   . Minutes of Exercise per Session:   Stress:   . Feeling of Stress :   Social Connections:   . Frequency of Communication with Friends and Family:   . Frequency of Social Gatherings with Friends and Family:   . Attends Religious Services:   . Active  Member of Clubs or Organizations:   . Attends Archivist Meetings:   Marland Kitchen Marital Status:    Family History  Problem Relation Age of Onset  . Alzheimer's disease Mother   . Leukemia Mother   . Brain cancer Brother   . Heart disease Neg Hx   . Diabetes Neg Hx   . Breast cancer Neg Hx   . Ovarian cancer Neg Hx    Scheduled Meds: . apixaban  5 mg Oral BID  . atorvastatin  20 mg Oral QHS  . benzonatate  100 mg Oral TID  . calcium-vitamin D  1 tablet Oral QPC lunch  . clotrimazole  1 application Topical BID  . furosemide  40 mg Oral BID  . ipratropium-albuterol  3 mL Nebulization QID  . levothyroxine  125 mcg Oral QAC breakfast  . nystatin cream   Topical BID  . potassium chloride SA  20 mEq Oral BID  . sodium chloride flush  3 mL Intravenous Q12H   Continuous Infusions: . sodium chloride     PRN Meds:.sodium chloride, acetaminophen, ALPRAZolam, cyclobenzaprine, docusate sodium, ondansetron (ZOFRAN) IV, sodium chloride flush Medications Prior to Admission:  Prior to Admission medications   Medication Sig Start Date End Date Taking? Authorizing Provider  acetaminophen (TYLENOL) 500 MG tablet Take 500-1,000 mg by mouth every 6 (six) hours as needed for mild pain or fever.    Yes [provider]  albuterol (PROVENTIL HFA;VENTOLIN HFA) 108 (90 BASE) MCG/ACT inhaler Inhale 2 puffs into the lungs every 6 (six) hours as needed for wheezing or shortness of breath.    Yes [provider]  amLODipine (NORVASC) 5 MG tablet Take 5 mg by mouth daily. 08/04/19  Yes [provider]  aspirin 81 MG chewable tablet Chew 81 mg by mouth daily.   Yes [provider]  Calcium Carbonate-Vitamin D (CALCIUM 600+D) 600-400 MG-UNIT tablet Take 1 tablet by mouth daily after lunch.    Yes [provider]  furosemide (LASIX) 20 MG tablet Take 40 mg by mouth daily as needed for fluid or edema.   Yes [provider]  guaiFENesin (MUCINEX) 600 MG 12 hr  tablet Take 600 mg by mouth 2 (two) times daily as needed for to loosen phlegm.   Yes [provider]  levothyroxine (SYNTHROID, LEVOTHROID) 125 MCG tablet Take 125 mcg by mouth daily before breakfast.   Yes [provider]  loratadine (CLARITIN) 10 MG tablet Take 10 mg by mouth daily.  Yes [provider]  nystatin cream (MYCOSTATIN) Apply 1 application topically 2 (two) times daily.  08/05/19  Yes [provider]  polyethylene glycol (MIRALAX / GLYCOLAX) 17 g packet Take 17 g by mouth daily as needed for moderate constipation.   Yes [provider]  Polyvinyl Alcohol-Povidone (REFRESH OP) Place 1 drop into both eyes as needed (for dry eyes).    Yes [provider]  pravastatin (PRAVACHOL) 20 MG tablet Take 20 mg by mouth at bedtime. 06/03/19  Yes [provider]  vitamin B-12 (CYANOCOBALAMIN) 1000 MCG tablet Take 1,000 mcg by mouth daily.   Yes [provider]   Allergies  Allergen Reactions  . Morphine And Related   . Vicodin [Hydrocodone-Acetaminophen] Other (See Comments)    Reaction:  Raises pts BP    Review of Systems  Respiratory: Positive for shortness of breath.     Physical Exam Pulmonary:     Effort: Pulmonary effort is normal.  Musculoskeletal:     Right lower leg: Edema present.     Left lower leg: Edema present.  Neurological:     Mental Status: She is alert.     Vital Signs: BP (!) 102/57 (BP Location: Right Wrist)   Pulse 73   Temp 99 F (37.2 C) (Oral)   Resp 18   Ht 5\' 4"  (1.626 m)   Wt (!) 171.4 kg   LMP 04/16/1993   SpO2 100%   BMI 64.87 kg/m  Pain Scale: 0-10 POSS *See Group Information*: 1-Acceptable,Awake and alert Pain Score: 0-No pain   SpO2: SpO2: 100 % O2 Device:SpO2: 100 % O2 Flow Rate: .   IO: Intake/output summary:   Intake/Output Summary (Last 24 hours) at 08/26/2019 1618 Last data filed at 08/26/2019 1342 Gross per 24 hour  Intake 540.72 ml  Output 1900 ml  Net  -1359.28 ml    LBM: Last BM Date: 08/23/19 Baseline Weight: Weight: (!) 181.4 kg Most recent weight: Weight: (!) 171.4 kg     Palliative Assessment/Data:     Time In: 4:05 Time Out: ::4:40 Time Total: 35 min Greater than 50%  of this time was spent counseling and coordinating care related to the above assessment and plan.  Signed by: Asencion Gowda, NP   Please contact Palliative Medicine Team phone at [phone] for questions and concerns.  For individual provider: See Shea Evans

## 2019-08-26 NOTE — Progress Notes (Signed)
Progress Note  Patient Name: [name] [facility] Date of Encounter: 08/26/2019  Primary Cardiologist: New to [facility] - consult by [place]  Subjective   No chest pain, palpitations, or dyspnea.  She has discussed her findings this admission with her son and they have agreed upon a palliative approach.  She declines further cardiac evaluation including diagnostic cardiac cath as well as intervention of her severe aortic stenosis.  She indicates she is "ready to meet my maker."  Awaiting palliative care consult to help assist with her transition to home.  Inpatient Medications    Scheduled Meds: . apixaban  5 mg Oral BID  . atorvastatin  20 mg Oral QHS  . benzonatate  100 mg Oral TID  . calcium-vitamin D  1 tablet Oral QPC lunch  . clotrimazole  1 application Topical BID  . furosemide  40 mg Oral BID  . ipratropium-albuterol  3 mL Nebulization QID  . levothyroxine  125 mcg Oral QAC breakfast  . nystatin cream   Topical BID  . potassium chloride SA  20 mEq Oral BID  . sodium chloride flush  3 mL Intravenous Q12H   Continuous Infusions: . sodium chloride     PRN Meds: sodium chloride, acetaminophen, ALPRAZolam, cyclobenzaprine, docusate sodium, ondansetron (ZOFRAN) IV, sodium chloride flush   Vital Signs    Vitals:   08/25/19 2000 08/26/19 0542 08/26/19 0746 08/26/19 1117  BP:  (!) 106/57 (!) 103/47 (!) 102/57  Pulse:  89 62 73  Resp: 17 18 16 18   Temp:  98.3 F (36.8 C) 97.7 F (36.5 C) 99 F (37.2 C)  TempSrc:  Oral  Oral  SpO2:  93% 94% 100%  Weight:  (!) 171.4 kg    Height:        Intake/Output Summary (Last 24 hours) at 08/26/2019 1154 Last data filed at 08/26/2019 0950 Gross per 24 hour  Intake 540.72 ml  Output 2600 ml  Net -2059.28 ml   Filed Weights   08/24/19 0301 08/25/19 0524 08/26/19 0542  Weight: (!) 172.6 kg (!) 170.5 kg (!) 171.4 kg    Telemetry    A. fib with aberrancy with ventricular rates in the 80s to low 100s bpm with rare PVC -  Personally Reviewed  ECG    No new tracings - Personally Reviewed  Physical Exam   GEN: No acute distress.   Neck: JVD difficult to assess secondary to body habitus. Cardiac:  Irregularly irregular, III/VI systolic murmur at the RUSB, no rubs, or gallops.  Respiratory: Clear to auscultation bilaterally.  GI: Soft, nontender, non-distended.   MS:  Bilateral lymphedema; No deformity. Neuro:  Alert and oriented x 3; Nonfocal.  Psych: Normal affect.  Labs    Chemistry Recent Labs  Lab 08/24/19 0531 08/25/19 0620 08/26/19 0601  NA 137 138 138  K 3.7 3.8 3.8  CL 99 99 99  CO2 29 30 30   GLUCOSE 110* 102* 106*  BUN 11 14 17   CREATININE 0.95 0.93 0.96  CALCIUM 8.6* 8.8* 9.0  GFRNONAA 57* 59* 57*  GFRAA >60 >60 >60  ANIONGAP 9 9 9      Hematology Recent Labs  Lab 08/24/19 0531 08/25/19 0620 08/26/19 0601  WBC 9.2 8.2 8.9  RBC 4.05 4.21 4.42  HGB 12.6 13.3 14.0  HCT 36.6 39.5 40.0  MCV 90.4 93.8 90.5  MCH 31.1 31.6 31.7  MCHC 34.4 33.7 35.0  RDW 13.2 13.1 13.1  PLT 217 236 228    Cardiac EnzymesNo results  for input(s): TROPONINI in the last 168 hours. No results for input(s): TROPIPOC in the last 168 hours.   BNP Recent Labs  Lab 08/22/19 1254  BNP 490.4*     DDimer No results for input(s): DDIMER in the last 168 hours.   Radiology    CXR 08/22/2019: IMPRESSION: 1. Very mild, hazy right basilar atelectasis and/or infiltrate. 2. Small left pleural effusion. __________  CTA chest 08/22/2019: IMPRESSION: 1. No pulmonary emboli. 2. Cardiomegaly with mild interstitial edema and small bilateral pleural effusions, right greater than left. Findings are consistent with congestive heart failure. 3. Aortic atherosclerosis.  Cardiac Studies   2D echo 08/24/2019: 1. Left ventricular ejection fraction, by estimation, is 60 to 65%. The  left ventricle has normal function. The left ventricle has no regional  wall motion abnormalities. There is moderate left  ventricular hypertrophy.  Left ventricular diastolic parameters are indeterminate.  2. Right ventricular systolic function is normal. The right ventricular  size is normal. Tricuspid regurgitation signal is inadequate for assessing  PA pressure.  3. 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 No  evidence of mitral stenosis.  4. The aortic valve is heavily calcified. Severe aortic valve stenosis.  Aortic valve mean gradient measures 48.0 mmHg. Aortic valve Vmax measures 4.49 m/s.  5. Rhythm suggestive of atrial fibrillation  Patient Profile     79 y.o. female with history of HTN, HLD, COPD, lymphedema, hypothyroidism, OSA, morbid obesity, OA, and GERD who we are seeing for new onset Afib and severe aortic stenosis.  Assessment & Plan    1. New onset Afib: -She remains in Afib with ventricular rates are well controlled in the 80s to low 100s bpm -Not currently on rate controlling medications, relative hypotension precludes addition of beta blocker  -CHADS2VASc at least 6 (CHF, HTN, age x 2, vascular disease, gender) -With patient and family preference for palliative approach, heparin drip will be discontinued and she will be placed back on Eliquis  -TSH normal -Lytes at goal  2. Acute diastolic CHF: -Volume status is difficult to assess secondary to body habitus  -Transition IV Lasix to p.o. Lasix 40 mg twice daily -Patient prefers palliative approach  3. Severe aortic stenosis/moderate mitral regurgitation: -After discussion with interventional cardiology and her son the patient and son have agreed for a palliative approach and declined further cardiac evaluation/intervention -In this setting, R/LHC for 6/23 has been canceled, and heparin has been transitioned back to Eliquis as no invasive procedures will be performed this admission -From a cardiac perspective she is clear for discharge though she will still need to be seen by palliative medicine to  help establish care with her transition to home and likely hospice which will be coordinated by the primary service  4. Elevated HS-Tn: -Mildly elevated with an initial value of 30 with a delta of 29 and peaking at 35 -Not consistent with ACS -Likely supply demand ischemia in the setting of the above -R/LHC has been declined by patient as outlined above -Eliquis in place of aspirin  5. HLD: -PTA Lipitor for now   For questions or updates, please contact [place] Please consult [email or web address] for contact info under Cardiology/STEMI.    Signed, Christell Faith, PA-C [place] Pager: [phone] 08/26/2019, 11:54 AM

## 2019-08-26 NOTE — Progress Notes (Signed)
PT Cancellation Note  Patient Details Name: [name] MRN: [id number] DOB: May 21, 1940   Cancelled Treatment:    Reason Eval/Treat Not Completed:  (Consult received and chart reviewed. Patient noted with pending palliative care consult to establish goals of care.  Will defer PT consult until consult complete and goals established, revisiting next date.)   [place] Vandiver H. Owens Shark, PT, DPT, NCS 08/26/19, 5:05 PM [phone]
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
## 2019-08-27 ENCOUNTER — Encounter
Admission: EM | Disposition: A | Discharge status: Hospice | Payer: Self-pay | Source: Home / Self Care | Attending: Internal Medicine

## 2019-08-27 LAB — CBC
HCT: 39.4 % (ref 36.0–46.0)
Hemoglobin: 13.8 g/dL (ref 12.0–15.0)
MCH: 32.2 pg (ref 26.0–34.0)
MCHC: 35 g/dL (ref 30.0–36.0)
MCV: 91.8 fL (ref 80.0–100.0)
Platelets: 229 10*3/uL (ref 150–400)
RBC: 4.29 MIL/uL (ref 3.87–5.11)
RDW: 13.2 % (ref 11.5–15.5)
WBC: 9 10*3/uL (ref 4.0–10.5)
nRBC: 0 % (ref 0.0–0.2)

## 2019-08-27 LAB — BASIC METABOLIC PANEL
Anion gap: 10 (ref 5–15)
BUN: 16 mg/dL (ref 8–23)
CO2: 30 mmol/L (ref 22–32)
Calcium: 9.1 mg/dL (ref 8.9–10.3)
Chloride: 100 mmol/L (ref 98–111)
Creatinine, Ser: 0.95 mg/dL (ref 0.44–1.00)
GFR calc Af Amer: >60 mL/min (ref >60)
GFR calc non Af Amer: 57 mL/min — ABNORMAL LOW (ref >60)
Glucose, Bld: 108 mg/dL — ABNORMAL HIGH (ref 70–99)
Potassium: 3.9 mmol/L (ref 3.5–5.1)
Sodium: 140 mmol/L (ref 135–145)

## 2019-08-27 SURGERY — RIGHT/LEFT HEART CATH AND CORONARY ANGIOGRAPHY
Anesthesia: Moderate Sedation

## 2019-08-27 MED ORDER — LEVOTHYROXINE SODIUM 25 MCG PO TABS
125.0000 ug | ORAL_TABLET | Freq: Every day | ORAL | Status: DC
  Administered 2019-08-27 – 2019-08-28 (×2): 125 ug via ORAL
  Filled 2019-08-27 (×2): qty 1

## 2019-08-27 MED ORDER — NAPHAZOLINE-GLYCERIN 0.012-0.2 % OP SOLN
1.0000 [drp] | Freq: Four times a day (QID) | OPHTHALMIC | Status: DC | PRN
  Filled 2019-08-27: qty 15

## 2019-08-27 MED ORDER — HYDROCORTISONE 1 % EX CREA
TOPICAL_CREAM | Freq: Two times a day (BID) | CUTANEOUS | Status: DC
  Filled 2019-08-27: qty 28

## 2019-08-27 NOTE — Progress Notes (Signed)
[facility] Liaison note:  New referral for [facility] hospice services at home received form TOC Bridget Cobb. Patient information sent to referral. Hospice eligibility pending.  Writer met in the room with patient to initiate education regarding hospice services, philosophy and team approach to care with understanding voiced.  DME needs discussed, patient does need a nebulizer machine. This has been ordered for delivery to her room on 6/24. TOC Bridget Cobb updated. Patient will require EMS transport at discharge with signed out of facility DNR in place.  Will continue to follow through discharge.  Flo Shanks BSN, RN, [place] [phone]

## 2019-08-27 NOTE — Progress Notes (Signed)
[place] paged to pt.'s rm. to assist pt. in completing AD.  When [facility] arrived, pt.'s youngest son sitting in chair by window; pt. in bed talking w/son.  'I've changed my mind --I think it would be a good idea to fill out the Living Will,' she said.  [place] left emergent situation in ED to bring document and told pt. he'd leave paperwork for her to peruse and then return later this evening to help pt. complete it.     08/27/19 1612  Clinical Encounter Type  Visited With Patient and family together  Visit Type Follow-up;Other (Comment) (Advanced care planning assistance)  Referral From Nurse;Patient
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
## 2019-08-27 NOTE — Care Management Important Message (Signed)
Important Message  Patient Details  Name: [name] MRN: [id number] Date of Birth: 07-26-1940   Medicare Important Message Given:  Yes     Dannette Barbara 08/27/2019, 11:05 AM
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
## 2019-08-27 NOTE — Progress Notes (Signed)
PT Cancellation Note  Patient Details Name: [name] [facility] MRN: [id number] DOB: 15-Feb-1941   Cancelled Treatment:    Reason Eval/Treat Not Completed:  (Per chart review, patient planning to discharge home with hospice, wishing to pursue focus on comfort at this time.  Will complete PT order at this time; please re-consult should goals of care or patient care needs change.)  Jeweldean Drohan H. Owens Shark, PT, DPT, NCS 08/27/19, 2:43 PM [phone]
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
## 2019-08-28 MED ORDER — POTASSIUM CHLORIDE CRYS ER 20 MEQ PO TBCR: 20.0000 meq | EXTENDED_RELEASE_TABLET | Freq: Every day | ORAL | 0 refills | Status: DC

## 2019-08-28 MED ORDER — APIXABAN 5 MG PO TABS: 5.0000 mg | ORAL_TABLET | Freq: Two times a day (BID) | ORAL | 0 refills | Status: DC

## 2019-08-28 MED ORDER — ATORVASTATIN CALCIUM 20 MG PO TABS: 20.0000 mg | ORAL_TABLET | Freq: Every day | ORAL | 0 refills | Status: DC

## 2019-08-28 MED ORDER — DOCUSATE SODIUM 100 MG PO CAPS: 100.0000 mg | ORAL_CAPSULE | Freq: Two times a day (BID) | ORAL | 0 refills | Status: DC | PRN

## 2019-08-28 MED ORDER — IPRATROPIUM-ALBUTEROL 0.5-2.5 (3) MG/3ML IN SOLN: 3.0000 mL | Freq: Two times a day (BID) | RESPIRATORY_TRACT | Status: DC

## 2019-08-28 MED ORDER — FUROSEMIDE 40 MG PO TABS: 40.0000 mg | ORAL_TABLET | Freq: Two times a day (BID) | ORAL | 0 refills | Status: DC

## 2019-08-28 MED ORDER — ALPRAZOLAM 0.25 MG PO TABS: 0.2500 mg | ORAL_TABLET | Freq: Two times a day (BID) | ORAL | 0 refills | Status: DC | PRN

## 2019-08-28 MED ORDER — IPRATROPIUM-ALBUTEROL 0.5-2.5 (3) MG/3ML IN SOLN: 3.0000 mL | Freq: Two times a day (BID) | RESPIRATORY_TRACT | 1 refills | Status: AC

## 2019-08-28 MED ORDER — BENZONATATE 100 MG PO CAPS: 100.0000 mg | ORAL_CAPSULE | Freq: Three times a day (TID) | ORAL | 0 refills | Status: DC

## 2019-08-28 MED ORDER — CLOTRIMAZOLE 1 % EX SOLN: 1.0000 "application " | Freq: Two times a day (BID) | CUTANEOUS | 0 refills | Status: DC

## 2019-08-28 MED ORDER — CYCLOBENZAPRINE HCL 10 MG PO TABS: 10.0000 mg | ORAL_TABLET | Freq: Three times a day (TID) | ORAL | 0 refills | Status: DC | PRN

## 2019-08-28 NOTE — TOC Transition Note (Signed)
Transition of Care [facility]) - CM/SW Discharge Note   Patient Details  Name: [name] MRN: [id number] Date of Birth: Jan 24, 1941  [facility] CM/SW Contact:  Eileen Stanford, LCSW Phone Number: 08/28/2019, 1:28 PM   Clinical Narrative: [facility] has been arranged.       Final next level of care: Home w Hospice Care Barriers to Discharge: No Barriers Identified   Patient Goals and CMS Choice Patient states their goals for this hospitalization and ongoing recovery are:: to go home   Choice offered to / list presented to : Patient  Discharge Placement                Patient to be transferred to facility by: [facility] Name of family member notified: Pt Patient and family notified of of transfer: 08/28/19  Discharge Plan and Services In-house Referral: Clinical Social Work   Post Acute Care Choice: NA                               Social Determinants of Health (SDOH) Interventions     Readmission Risk Interventions No flowsheet data found.

## 2019-08-28 NOTE — Progress Notes (Signed)
OT Cancellation Note  Patient Details Name: [name] [facility] MRN: [id number] DOB: April 29, 1940   Cancelled Treatment:    Reason Eval/Treat Not Completed: Other (comment). Chart reviewed. Per Palliative consult, pt wants to go home with hospice and focus on comfort based care for what time she has left. Will complete current OT order.   Jeni Salles, MPH, MS, OTR/L ascom [phone] 08/28/19, 11:40 AM

## 2019-08-28 NOTE — Progress Notes (Signed)
[facility] Liaison note:  Follow up visit made to new referral for [facility] hospice services at home. Patient seen sitting up in bed, alert and interactive. Nebulizer machine has been delivered and Probation officer provided education on use. Staff RN Anderson Malta and TOC Loletha Grayer made aware. Plan is for discharge home today via EMS with signed out of facility DNR in place. Discharge summary sent to referral. Hospice admission has been set for 1pm on 6/25, patient and her caregiver [facility] aware. Patient requests that her son Sharen Heck be contacted when EMS arrives for transport. Thank you. Flo Shanks BSN, RN, [place] [phone]
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
## 2019-08-28 NOTE — Progress Notes (Signed)
Discussed discharge instruction with patient, including medications and follow up appointments.   Sent a copy of instruction with patient and EMS.   Patient Nebulizer machine also sent with EMS.    IV removed, no further questions.

## 2019-08-28 NOTE — Discharge Summary (Signed)
Physician Discharge Summary  [name] [facility] PYK:[id number] DOB: 1940/04/06 DOA: 08/22/2019  PCP: Orvis Brill, [facility]  Admit date: 08/22/2019 Discharge date: 08/28/2019  Time spent: 45 minutes  Recommendations for Outpatient Follow-up:  Patient will be discharged to home with hospice.  Patient will need to follow up with primary care provider within one week of discharge.  Follow up with cardiology as needed.Patient should continue medications as prescribed.  Patient should follow a heart healthy diet.    Discharge Diagnoses:  Acute diastolic CHF exacerbation Severe critical aortic stenosis Atrial fibrillation, new onset Chest pain/elevated troponin COPD Dyslipidemia Hypothyroidism Morbid obesity Questionable foot fungal infection Goals of care  Discharge Condition: Stable  Diet recommendation: heart healthy  Filed Weights   08/25/19 0524 08/26/19 0542 08/28/19 0522  Weight: (!) 170.5 kg (!) 171.4 kg (!) 168.9 kg    History of present illness:  On 08/22/2019 by Dr. Eugenie Norrie [name] [id number] y.o.morbidly obese Caucasian femalewith a known history of asthma, COPD, hypertension, hypothyroidism, dyslipidemia and sleep apnea, who presented to the emergency room with acute onset of worsening dyspnea with associated orthopnea and lower extremity edema over the last several days. She admitted to cough productive of whitish sputum and occasional wheezing. She also admits to occasional chest pain that feels like mild tightness with ambulation. She has been having dyspnea on exertion. She denies any palpitations. No fever or chills. No nausea or vomiting or abdominal pain  Hospital Course:  Acute diastolic CHF exacerbation -New onset -Echocardiogram shows an EF of 60 to 76%, diastolic functions are indeterminate -Patient now placed on oral Lasix -Cardiology consulted and appreciated-recommend continuing Eliquis, statin, Lasix, potassium supplementation and  possibly following up in the office on an as-needed basis -Continue to monitor intake and output, daily weights  Severe critical aortic stenosis -Patient is in need of left and right heart cath however refuses and would like to proceed with palliative care/hospice -Unlikely to be an aortic valve replacement candidate -Palliative care consulted and appreciated  Atrial fibrillation -New onset -CHA2DS2-VASc 5 -Continue Eliquis -Currently rate controlled  Chest pain/elevated troponin -Suspect secondary to the above, no longer having chest pain at this time -Troponins were minimally elevated-consistent with ACS, likely demand -As above, cardiology was consulted  COPD -Without current exacerbation -Continue Advair and Breo  Dyslipidemia -Continue statin  Hypothyroidism -Continue Synthroid  Morbid obesity -BMI of 64.87  Questionable foot fungal infection -Patient states that her right foot has been itching -Continue nystatin and clotrimazole cream -Will order hydrocortisone cream as well  Goals of care -Palliative care consulted and appreciate -Patient currently DNR -Plan for home with hospice as patient has less than 6 months prognosis per palliative care  Code status: DNR  Consultants Cardiology Palliative care  Procedures  Echocardiogram  Discharge Exam: Vitals:   08/28/19 0804 08/28/19 1145  BP: (!) 112/58 (!) 96/53  Pulse: 73 71  Resp: 19 18  Temp: 97.9 F (36.6 C) 98 F (36.7 C)  SpO2: 96% 95%     General: Well developed, chronically ill-appearing, NAD  HEENT: NCAT, mucous membranes moist.  Cardiovascular: S1 S2 auscultated, 2/6 SEM, irregular  Respiratory: Diminished breath sounds however clear  Abdomen: Soft, obese, nontender, nondistended, + bowel sounds  Extremities: warm dry without cyanosis clubbing.  Bilateral LE edema.  Area of erythema on dorsum of right foot  Neuro: AAOx3, nonfocal nonfocal  Psych: Pleasant, appropriate  mood and affect  Discharge Instructions Discharge Instructions    Amb referral to [facility]   Complete  by: As directed    Discharge instructions   Complete by: As directed    Patient will be discharged to home with hospice.  Patient will need to follow up with primary care provider within one week of discharge.  Follow up with cardiology as needed.Patient should continue medications as prescribed.  Patient should follow a heart healthy diet.   Discharge wound care:   Complete by: As directed    Keep wound care clean and dry.     Allergies as of 08/28/2019      Reactions   Morphine And Related    Vicodin [hydrocodone-acetaminophen] Other (See Comments)   Reaction:  Raises pts BP       Medication List    STOP taking these medications   pravastatin 20 MG tablet Commonly known as: PRAVACHOL     TAKE these medications   acetaminophen 500 MG tablet Commonly known as: TYLENOL Take 500-1,000 mg by mouth every 6 (six) hours as needed for mild pain or fever.   albuterol 108 (90 Base) MCG/ACT inhaler Commonly known as: VENTOLIN HFA Inhale 2 puffs into the lungs every 6 (six) hours as needed for wheezing or shortness of breath.   ALPRAZolam 0.25 MG tablet Commonly known as: XANAX Take 1 tablet (0.25 mg total) by mouth 2 (two) times daily as needed for anxiety.   amLODipine 5 MG tablet Commonly known as: NORVASC Take 5 mg by mouth daily.   apixaban 5 MG Tabs tablet Commonly known as: ELIQUIS Take 1 tablet (5 mg total) by mouth 2 (two) times daily.   aspirin 81 MG chewable tablet Chew 81 mg by mouth daily.   atorvastatin 20 MG tablet Commonly known as: LIPITOR Take 1 tablet (20 mg total) by mouth at bedtime.   benzonatate 100 MG capsule Commonly known as: TESSALON Take 1 capsule (100 mg total) by mouth 3 (three) times daily.   Calcium 600+D 600-400 MG-UNIT tablet Generic drug: Calcium Carbonate-Vitamin D Take 1 tablet by mouth daily after lunch.   clotrimazole 1 %  external solution Commonly known as: LOTRIMIN Apply 1 application topically 2 (two) times daily.   cyclobenzaprine 10 MG tablet Commonly known as: FLEXERIL Take 1 tablet (10 mg total) by mouth 3 (three) times daily as needed for muscle spasms.   docusate sodium 100 MG capsule Commonly known as: COLACE Take 1 capsule (100 mg total) by mouth 2 (two) times daily as needed for mild constipation.   furosemide 40 MG tablet Commonly known as: LASIX Take 1 tablet (40 mg total) by mouth 2 (two) times daily. What changed:   medication strength  when to take this  reasons to take this   guaiFENesin 600 MG 12 hr tablet Commonly known as: MUCINEX Take 600 mg by mouth 2 (two) times daily as needed for to loosen phlegm.   ipratropium-albuterol 0.5-2.5 (3) MG/3ML Soln Commonly known as: DUONEB Take 3 mLs by nebulization 2 (two) times daily.   levothyroxine 125 MCG tablet Commonly known as: SYNTHROID Take 125 mcg by mouth daily before breakfast.   loratadine 10 MG tablet Commonly known as: CLARITIN Take 10 mg by mouth daily.   nystatin cream Commonly known as: MYCOSTATIN Apply 1 application topically 2 (two) times daily.   polyethylene glycol 17 g packet Commonly known as: MIRALAX / GLYCOLAX Take 17 g by mouth daily as needed for moderate constipation.   potassium chloride SA 20 MEQ tablet Commonly known as: KLOR-CON Take 1 tablet (20 mEq total) by mouth daily.  REFRESH OP Place 1 drop into both eyes as needed (for dry eyes).   vitamin B-12 1000 MCG tablet Commonly known as: CYANOCOBALAMIN Take 1,000 mcg by mouth daily.            Discharge Care Instructions  (From admission, onward)         Start     Ordered   08/28/19 0000  Discharge wound care:       Comments: Keep wound care clean and dry.   08/28/19 1203         Allergies  Allergen Reactions  . Morphine And Related   . Vicodin [Hydrocodone-Acetaminophen] Other (See Comments)    Reaction:  Raises  pts BP     Follow-up Information    [facility] Follow up on 09/16/2019.   Specialty: Cardiology Why: at 11:00am. Enter through the [facility] entrance Contact information: [place] [phone]               The results of significant diagnostics from this hospitalization (including imaging, microbiology, ancillary and laboratory) are listed below for reference.    Significant Diagnostic Studies: DG Chest 2 View  Result Date: 08/22/2019 CLINICAL DATA:  Dyspnea. EXAM: CHEST - 2 VIEW COMPARISON:  None. FINDINGS: Very mild, hazy right basilar atelectasis and/or infiltrate is noted. There is a small left pleural effusion. No pneumothorax is identified. The cardiac silhouette is mildly enlarged and unchanged in size. There is mild calcification of the aortic arch. Multilevel degenerative changes seen throughout the thoracic spine. IMPRESSION: 1. Very mild, hazy right basilar atelectasis and/or infiltrate. 2. Small left pleural effusion. Electronically Signed   By: Virgina Norfolk M.D.   On: 08/22/2019 19:05   CT Angio Chest PE W and/or Wo Contrast  Result Date: 08/22/2019 CLINICAL DATA:  Shortness of breath. EXAM: CT ANGIOGRAPHY CHEST WITH CONTRAST TECHNIQUE: Multidetector CT imaging of the chest was performed using the standard protocol during bolus administration of intravenous contrast. Multiplanar CT image reconstructions and MIPs were obtained to evaluate the vascular anatomy. CONTRAST:  148mL OMNIPAQUE IOHEXOL 350 MG/ML SOLN COMPARISON:  Chest x-ray dated 08/22/2019 FINDINGS: Cardiovascular: No pulmonary emboli. Aortic atherosclerosis. Coronary artery calcifications. Calcification in the aortic valve and mitral valve annulus. Or lung cardiomegaly. Trace pericardial effusion. Mediastinum/Nodes: Thyroid gland is normal. No hilar or mediastinal adenopathy. Tiny hiatal hernia. Lungs/Pleura:  Slight bilateral interstitial edema. Small bilateral pleural effusions, right greater than left. Upper Abdomen: Normal. Musculoskeletal: No chest wall abnormality. No acute or significant osseous findings. Review of the MIP images confirms the above findings. IMPRESSION: 1. No pulmonary emboli. 2. Cardiomegaly with mild interstitial edema and small bilateral pleural effusions, right greater than left. Findings are consistent with congestive heart failure. 3. Aortic atherosclerosis. Aortic Atherosclerosis (ICD10-I70.0). Electronically Signed   By: Lorriane Shire M.D.   On: 08/22/2019 19:52   ECHOCARDIOGRAM COMPLETE  Result Date: 08/24/2019    ECHOCARDIOGRAM REPORT   Patient Name:   [name] Date of Exam: 08/24/2019 Medical Rec #:  [id number]          Height:       64.0 in Accession #:    [id number]         Weight:       400.6 lb Date of Birth:  12/16/1940          BSA:          2.628 m Patient Age:  78 years           BP:           110/70 mmHg Patient Gender: F                  HR:           78 bpm. Exam Location:  [facility] Procedure: 2D Echo Indications:     Acute Diastolic CHF  History:         Patient has no prior history of Echocardiogram examinations.                  Signs/Symptoms:Murmur; Risk Factors:Hypertension and                  Dyslipidemia.  Sonographer:     L Thornton-Maynard Referring Phys:  [id number] [place] Diagnosing Phys: Ida Rogue MD IMPRESSIONS  1. Left ventricular ejection fraction, by estimation, is 60 to 65%. The left ventricle has normal function. The left ventricle has no regional wall motion abnormalities. There is moderate left ventricular hypertrophy. Left ventricular diastolic parameters are indeterminate.  2. Right ventricular systolic function is normal. The right ventricular size is normal. Tricuspid regurgitation signal is inadequate for assessing PA pressure.  3. 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 No evidence of mitral stenosis.   4. The aortic valve is heavily calcified. Severe aortic valve stenosis. Aortic valve mean gradient measures 48.0 mmHg. Aortic valve Vmax measures 4.49 m/s.  5. Rhythm suggestive of atrial fibrillation FINDINGS  Left Ventricle: Left ventricular ejection fraction, by estimation, is 60 to 65%. The left ventricle has normal function. The left ventricle has no regional wall motion abnormalities. The left ventricular internal cavity size was normal in size. There is  moderate left ventricular hypertrophy. Left ventricular diastolic parameters are indeterminate. Right Ventricle: The right ventricular size is normal. No increase in right ventricular wall thickness. Right ventricular systolic function is normal. Tricuspid regurgitation signal is inadequate for assessing PA pressure. The tricuspid regurgitant velocity is 2.47 m/s, and with an assumed right atrial pressure of 10 mmHg, the estimated right ventricular systolic pressure is 68.0 mmHg. Left Atrium: Left atrial size was normal in size. Right Atrium: Right atrial size was normal in size. Pericardium: A small pericardial effusion is present. Mitral Valve: The mitral valve is rheumatic. Normal mobility of the mitral valve leaflets. Moderate mitral valve regurgitation. No evidence of mitral valve stenosis. MV peak gradient, 13.4 mmHg. The mean mitral valve gradient is 5.0 mmHg. Tricuspid Valve: The tricuspid valve is normal in structure. Tricuspid valve regurgitation is mild . No evidence of tricuspid stenosis. Aortic Valve: The aortic valve is normal in structure. Aortic valve regurgitation is not visualized. Severe aortic stenosis is present. Aortic valve mean gradient measures 48.0 mmHg. Aortic valve peak gradient measures 80.6 mmHg. Aortic valve area, by VTI measures 0.31 cm. Pulmonic Valve: The pulmonic valve was normal in structure. Pulmonic valve regurgitation is not visualized. No evidence of pulmonic stenosis. Aorta: The aortic root is normal in size and  structure. Venous: The inferior vena cava is normal in size with greater than 50% respiratory variability, suggesting right atrial pressure of 3 mmHg. IAS/Shunts: No atrial level shunt detected by color flow Doppler.  LEFT VENTRICLE PLAX 2D LVIDd:         3.36 cm  Diastology LVIDs:         2.17 cm  LV e' lateral:   6.26 cm/s LV PW:  1.37 cm  LV E/e' lateral: 13.8 LV IVS:        2.61 cm  LV e' medial:    6.35 cm/s LVOT diam:     1.90 cm  LV E/e' medial:  13.6 LV SV:         28 LV SV Index:   11 LVOT Area:     2.84 cm  RIGHT VENTRICLE RV S prime:     7.45 cm/s TAPSE (M-mode): 1.9 cm LEFT ATRIUM             Index LA diam:        3.40 cm 1.29 cm/m LA Vol (A2C):   54.9 ml 20.89 ml/m LA Vol (A4C):   88.7 ml 33.75 ml/m LA Biplane Vol: 74.7 ml 28.42 ml/m  AORTIC VALVE AV Area (Vmax):    0.37 cm AV Area (Vmean):   0.37 cm AV Area (VTI):     0.31 cm AV Vmax:           449.00 cm/s AV Vmean:          317.000 cm/s AV VTI:            0.926 m AV Peak Grad:      80.6 mmHg AV Mean Grad:      48.0 mmHg LVOT Vmax:         58.70 cm/s LVOT Vmean:        40.900 cm/s LVOT VTI:          0.100 m LVOT/AV VTI ratio: 0.11  AORTA Ao Root diam: 2.80 cm MITRAL VALVE                TRICUSPID VALVE MV Area (PHT): 3.24 cm     TR Peak grad:   24.4 mmHg MV Peak grad:  13.4 mmHg    TR Vmax:        247.00 cm/s MV Mean grad:  5.0 mmHg MV Vmax:       1.83 m/s     SHUNTS MV Vmean:      96.6 cm/s    Systemic VTI:  0.10 m MV E velocity: 86.50 cm/s   Systemic Diam: 1.90 cm MV A velocity: 135.00 cm/s MV E/A ratio:  0.64 Ida Rogue MD Electronically signed by Ida Rogue MD Signature Date/Time: 08/24/2019/2:00:51 PM    Final     Microbiology: Recent Results (from the past 240 hour(s))  SARS Coronavirus 2 by RT PCR (hospital order, performed in [place] hospital lab) Nasopharyngeal Nasopharyngeal Swab     Status: None   Collection Time: 08/22/19  8:47 PM   Specimen: Nasopharyngeal Swab  Result Value Ref Range Status   SARS  Coronavirus 2 NEGATIVE NEGATIVE Final    Comment: (NOTE) SARS-CoV-2 target nucleic acids are NOT DETECTED.  The SARS-CoV-2 RNA is generally detectable in upper and lower respiratory specimens during the acute phase of infection. The lowest concentration of SARS-CoV-2 viral copies this assay can detect is 250 copies / mL. A negative result does not preclude SARS-CoV-2 infection and should not be used as the sole basis for treatment or other patient management decisions.  A negative result may occur with improper specimen collection / handling, submission of specimen other than nasopharyngeal swab, presence of viral mutation(s) within the areas targeted by this assay, and inadequate number of viral copies (<250 copies / mL). A negative result must be combined with clinical observations, patient history, and epidemiological information.  Fact Sheet for Patients:   StrictlyIdeas.no  Fact Sheet for Healthcare Providers: BankingDealers.co.za  This test is not yet approved or  cleared by the [place] FDA and has been authorized for detection and/or diagnosis of SARS-CoV-2 by FDA under an Emergency Use Authorization (EUA).  This EUA will remain in effect (meaning this test can be used) for the duration of the COVID-19 declaration under Section 564(b)(1) of the Act, 21 U.S.C. section 360bbb-3(b)(1), unless the authorization is terminated or revoked sooner.  Performed at [facility], [address] [place], [place] [id number]      Labs: Basic Metabolic Panel: Recent Labs  Lab 08/22/19 1254 08/22/19 1850 08/23/19 0541 08/24/19 0531 08/25/19 0620 08/26/19 0601 08/27/19 0417  NA   < >  --  137 137 138 138 140  K   < >  --  3.7 3.7 3.8 3.8 3.9  CL   < >  --  99 99 99 99 100  CO2   < >  --  29 29 30 30 30   GLUCOSE   < >  --  112* 110* 102* 106* 108*  BUN   < >  --  11 11 14 17 16   CREATININE   < >  --  0.93 0.95 0.93  0.96 0.95  CALCIUM   < >  --  8.8* 8.6* 8.8* 9.0 9.1  MG  --  1.7  --   --   --   --   --    < > = values in this interval not displayed.   Liver Function Tests: No results for input(s): AST, ALT, ALKPHOS, BILITOT, PROT, ALBUMIN in the last 168 hours. No results for input(s): LIPASE, AMYLASE in the last 168 hours. No results for input(s): AMMONIA in the last 168 hours. CBC: Recent Labs  Lab 08/23/19 1121 08/24/19 0531 08/25/19 0620 08/26/19 0601 08/27/19 0417  WBC 9.7 9.2 8.2 8.9 9.0  HGB 13.8 12.6 13.3 14.0 13.8  HCT 40.5 36.6 39.5 40.0 39.4  MCV 92.5 90.4 93.8 90.5 91.8  PLT 231 217 236 228 229   Cardiac Enzymes: No results for input(s): CKTOTAL, CKMB, CKMBINDEX, TROPONINI in the last 168 hours. BNP: BNP (last 3 results) Recent Labs    08/22/19 1254  BNP 490.4*    ProBNP (last 3 results) No results for input(s): PROBNP in the last 8760 hours.  CBG: No results for input(s): GLUCAP in the last 168 hours.     Signed:  Cristal Ford  [facility] 08/28/2019, 12:04 PM

## 2019-08-28 NOTE — Progress Notes (Addendum)
   08/28/19 1045  Clinical Encounter Type  Visited With Patient;Family  Visit Type Follow-up;Spiritual support;Social support  Referral From Nurse  Consult/Referral To Chaplain  Bland Span and Berneda Rose responded to Ad follow-up. Ch's received AD.  Ch's will Follow-up with Pt.

## 2019-09-16 ENCOUNTER — Ambulatory Visit: Payer: Medicare HMO | Admitting: Family

## 2020-05-05 ENCOUNTER — Inpatient Hospital Stay
Admission: EM | Admit: 2020-05-05 | Discharge: 2020-05-10 | DRG: 291 | Disposition: A | Discharge status: Skilled Nursing Facility | Attending: Internal Medicine | Admitting: Internal Medicine

## 2020-05-05 ENCOUNTER — Emergency Department

## 2020-05-05 ENCOUNTER — Other Ambulatory Visit: Payer: Self-pay

## 2020-05-05 DIAGNOSIS — Z79899 Other long term (current) drug therapy: Secondary | ICD-10-CM

## 2020-05-05 DIAGNOSIS — Z20822 Contact with and (suspected) exposure to covid-19: Secondary | ICD-10-CM | POA: Diagnosis present

## 2020-05-05 DIAGNOSIS — E7800 Pure hypercholesterolemia, unspecified: Secondary | ICD-10-CM | POA: Diagnosis present

## 2020-05-05 DIAGNOSIS — J449 Chronic obstructive pulmonary disease, unspecified: Secondary | ICD-10-CM | POA: Diagnosis present

## 2020-05-05 DIAGNOSIS — K5900 Constipation, unspecified: Secondary | ICD-10-CM | POA: Diagnosis present

## 2020-05-05 DIAGNOSIS — I509 Heart failure, unspecified: Secondary | ICD-10-CM

## 2020-05-05 DIAGNOSIS — K219 Gastro-esophageal reflux disease without esophagitis: Secondary | ICD-10-CM | POA: Diagnosis present

## 2020-05-05 DIAGNOSIS — F32A Depression, unspecified: Secondary | ICD-10-CM | POA: Diagnosis present

## 2020-05-05 DIAGNOSIS — Z7901 Long term (current) use of anticoagulants: Secondary | ICD-10-CM

## 2020-05-05 DIAGNOSIS — Z66 Do not resuscitate: Secondary | ICD-10-CM | POA: Diagnosis present

## 2020-05-05 DIAGNOSIS — Z515 Encounter for palliative care: Secondary | ICD-10-CM

## 2020-05-05 DIAGNOSIS — L89319 Pressure ulcer of right buttock, unspecified stage: Secondary | ICD-10-CM | POA: Diagnosis not present

## 2020-05-05 DIAGNOSIS — F419 Anxiety disorder, unspecified: Secondary | ICD-10-CM | POA: Diagnosis present

## 2020-05-05 DIAGNOSIS — Z7902 Long term (current) use of antithrombotics/antiplatelets: Secondary | ICD-10-CM

## 2020-05-05 DIAGNOSIS — L03116 Cellulitis of left lower limb: Secondary | ICD-10-CM | POA: Diagnosis present

## 2020-05-05 DIAGNOSIS — Z885 Allergy status to narcotic agent status: Secondary | ICD-10-CM

## 2020-05-05 DIAGNOSIS — J45909 Unspecified asthma, uncomplicated: Secondary | ICD-10-CM | POA: Diagnosis present

## 2020-05-05 DIAGNOSIS — Z7982 Long term (current) use of aspirin: Secondary | ICD-10-CM

## 2020-05-05 DIAGNOSIS — I110 Hypertensive heart disease with heart failure: Secondary | ICD-10-CM | POA: Diagnosis not present

## 2020-05-05 DIAGNOSIS — L89329 Pressure ulcer of left buttock, unspecified stage: Secondary | ICD-10-CM | POA: Diagnosis not present

## 2020-05-05 DIAGNOSIS — I10 Essential (primary) hypertension: Secondary | ICD-10-CM | POA: Diagnosis present

## 2020-05-05 DIAGNOSIS — I482 Chronic atrial fibrillation, unspecified: Secondary | ICD-10-CM | POA: Diagnosis present

## 2020-05-05 DIAGNOSIS — G4730 Sleep apnea, unspecified: Secondary | ICD-10-CM | POA: Diagnosis present

## 2020-05-05 DIAGNOSIS — I5033 Acute on chronic diastolic (congestive) heart failure: Secondary | ICD-10-CM | POA: Diagnosis present

## 2020-05-05 DIAGNOSIS — R600 Localized edema: Secondary | ICD-10-CM

## 2020-05-05 DIAGNOSIS — I350 Nonrheumatic aortic (valve) stenosis: Secondary | ICD-10-CM | POA: Diagnosis present

## 2020-05-05 DIAGNOSIS — Z599 Problem related to housing and economic circumstances, unspecified: Secondary | ICD-10-CM

## 2020-05-05 DIAGNOSIS — E039 Hypothyroidism, unspecified: Secondary | ICD-10-CM | POA: Diagnosis present

## 2020-05-05 DIAGNOSIS — I5032 Chronic diastolic (congestive) heart failure: Secondary | ICD-10-CM | POA: Diagnosis present

## 2020-05-05 DIAGNOSIS — Z79890 Hormone replacement therapy (postmenopausal): Secondary | ICD-10-CM

## 2020-05-05 DIAGNOSIS — I4891 Unspecified atrial fibrillation: Secondary | ICD-10-CM | POA: Diagnosis present

## 2020-05-05 LAB — BASIC METABOLIC PANEL
Anion gap: 10 (ref 5–15)
BUN: 12 mg/dL (ref 8–23)
CO2: 28 mmol/L (ref 22–32)
Calcium: 9.3 mg/dL (ref 8.9–10.3)
Chloride: 97 mmol/L — ABNORMAL LOW (ref 98–111)
Creatinine, Ser: 0.8 mg/dL (ref 0.44–1.00)
GFR, Estimated: >60 mL/min (ref >60)
Glucose, Bld: 118 mg/dL — ABNORMAL HIGH (ref 70–99)
Potassium: 3.9 mmol/L (ref 3.5–5.1)
Sodium: 135 mmol/L (ref 135–145)

## 2020-05-05 LAB — CBC
HCT: 39.5 % (ref 36.0–46.0)
Hemoglobin: 13.2 g/dL (ref 12.0–15.0)
MCH: 29.9 pg (ref 26.0–34.0)
MCHC: 33.4 g/dL (ref 30.0–36.0)
MCV: 89.6 fL (ref 80.0–100.0)
Platelets: 262 10*3/uL (ref 150–400)
RBC: 4.41 MIL/uL (ref 3.87–5.11)
RDW: 14.8 % (ref 11.5–15.5)
WBC: 10.3 10*3/uL (ref 4.0–10.5)
nRBC: 0 % (ref 0.0–0.2)

## 2020-05-05 LAB — TROPONIN I (HIGH SENSITIVITY): Troponin I (High Sensitivity): 20 ng/L — ABNORMAL HIGH (ref <18)

## 2020-05-05 LAB — LACTIC ACID, PLASMA: Lactic Acid, Venous: 1.4 mmol/L (ref 0.5–1.9)

## 2020-05-05 LAB — BRAIN NATRIURETIC PEPTIDE: B Natriuretic Peptide: 465.1 pg/mL — ABNORMAL HIGH (ref 0.0–100.0)

## 2020-05-05 MED ORDER — FUROSEMIDE 10 MG/ML IJ SOLN
40.0000 mg | Freq: Once | INTRAMUSCULAR | Status: AC
  Administered 2020-05-05: 40 mg via INTRAVENOUS
  Filled 2020-05-05: qty 4

## 2020-05-05 MED ORDER — ONDANSETRON HCL 4 MG PO TABS: 4.0000 mg | ORAL_TABLET | Freq: Four times a day (QID) | ORAL | Status: DC | PRN

## 2020-05-05 MED ORDER — ATORVASTATIN CALCIUM 20 MG PO TABS
20.0000 mg | ORAL_TABLET | Freq: Every day | ORAL | Status: DC
  Administered 2020-05-06 – 2020-05-09 (×4): 20 mg via ORAL
  Filled 2020-05-05 (×5): qty 1

## 2020-05-05 MED ORDER — POTASSIUM CHLORIDE CRYS ER 20 MEQ PO TBCR
20.0000 meq | EXTENDED_RELEASE_TABLET | Freq: Every day | ORAL | Status: DC
  Administered 2020-05-06 – 2020-05-09 (×4): 20 meq via ORAL
  Filled 2020-05-05 (×6): qty 1

## 2020-05-05 MED ORDER — FUROSEMIDE 10 MG/ML IJ SOLN
40.0000 mg | Freq: Two times a day (BID) | INTRAMUSCULAR | Status: DC
  Administered 2020-05-06 – 2020-05-09 (×6): 40 mg via INTRAVENOUS
  Filled 2020-05-05 (×6): qty 4

## 2020-05-05 MED ORDER — APIXABAN 5 MG PO TABS
5.0000 mg | ORAL_TABLET | Freq: Two times a day (BID) | ORAL | Status: DC
  Administered 2020-05-05 – 2020-05-10 (×10): 5 mg via ORAL
  Filled 2020-05-05 (×11): qty 1

## 2020-05-05 MED ORDER — ACETAMINOPHEN 325 MG PO TABS
650.0000 mg | ORAL_TABLET | Freq: Four times a day (QID) | ORAL | Status: DC | PRN
  Administered 2020-05-07: 650 mg via ORAL
  Filled 2020-05-05: qty 2

## 2020-05-05 MED ORDER — ALBUTEROL SULFATE HFA 108 (90 BASE) MCG/ACT IN AERS
2.0000 | INHALATION_SPRAY | Freq: Four times a day (QID) | RESPIRATORY_TRACT | Status: DC | PRN
  Administered 2020-05-06 – 2020-05-10 (×5): 2 via RESPIRATORY_TRACT
  Filled 2020-05-05 (×2): qty 6.7

## 2020-05-05 MED ORDER — ONDANSETRON HCL 4 MG/2ML IJ SOLN: 4.0000 mg | Freq: Four times a day (QID) | INTRAMUSCULAR | Status: DC | PRN

## 2020-05-05 MED ORDER — SODIUM CHLORIDE 0.9% FLUSH
3.0000 mL | Freq: Two times a day (BID) | INTRAVENOUS | Status: DC
  Administered 2020-05-06 – 2020-05-10 (×10): 3 mL via INTRAVENOUS

## 2020-05-05 MED ORDER — LEVOTHYROXINE SODIUM 50 MCG PO TABS
125.0000 ug | ORAL_TABLET | Freq: Every day | ORAL | Status: DC
  Administered 2020-05-06 – 2020-05-10 (×4): 125 ug via ORAL
  Filled 2020-05-05: qty 1
  Filled 2020-05-05: qty 0.5
  Filled 2020-05-05 (×3): qty 1

## 2020-05-05 MED ORDER — IPRATROPIUM-ALBUTEROL 0.5-2.5 (3) MG/3ML IN SOLN
3.0000 mL | Freq: Two times a day (BID) | RESPIRATORY_TRACT | Status: DC
  Administered 2020-05-05 – 2020-05-07 (×4): 3 mL via RESPIRATORY_TRACT
  Filled 2020-05-05 (×4): qty 3

## 2020-05-05 MED ORDER — ACETAMINOPHEN 650 MG RE SUPP: 650.0000 mg | Freq: Four times a day (QID) | RECTAL | Status: DC | PRN

## 2020-05-05 NOTE — ED Triage Notes (Signed)
Pt to ED via [facility] from home with c/o lower leg cellulitis x 4-5 months, per EMS pt now unable to stand, currently in the care of hospice. Per EMS pt currently under the care of hospice for heart failure.    126/70 80HR 98% RA

## 2020-05-05 NOTE — ED Notes (Signed)
Pt had soaked linens and chux. Pt cleaned up and clean chux linens and brief placed. Pt pulled up in bed and repositioned at this time. Pt given warm blankets and another pillow for feet.

## 2020-05-05 NOTE — ED Notes (Signed)
Pt at XRAY

## 2020-05-05 NOTE — ED Triage Notes (Signed)
Pt comes via EMS from home with c/o bilateral lower leg cellulitis for 4-5 months. Pt unable to bear weight and is now bedbound.  Pt has redness noted to lower extremities. Pt states pain and burning. Pt has severe swelling.

## 2020-05-05 NOTE — ED Notes (Signed)
Pt presents to ED with c/o of redness and swelling to L lower extremity. Pt states she is unsure of when she noticed it but states she has not been able to walk since it started. Pt denies fevers or chills. LLE is red, erythemic, and warm to touch. Pt states she can usually use a walker but states there is pan to LLE which is preventing her from walking. Pt states she is on hospice for heart failure but denies chest pain or SOB.

## 2020-05-05 NOTE — ED Provider Notes (Signed)
[facility] Emergency Department Provider Note   ____________________________________________   Event Date/Time   First MD Initiated Contact with Patient 05/05/20 1720     (approximate)  I have reviewed the triage vital signs and the nursing notes.   HISTORY  Chief Complaint Cellulitis    HPI [name] is a 80 y.o. female with past medical history of hypertension, hyperlipidemia, asthma, CHF, and atrial fibrillation who presents to the ED complaining of leg swelling.  Patient reports that she has had increasing swelling to both of her legs over the past couple of months.  She denies any focal areas of pain but states her legs have become increasingly uncomfortable, making it difficult for her to walk.  She is now unable to take more than a couple of steps on her own and is having difficulty caring for herself at home.  She states she also gets very out of breath whenever she has to exert herself.  She denies any fevers, cough, or chest pain.  She does report compliance with her usual Lasix.  She currently follows with [facility] hospice, who suggested she seek care in the ED due to inability to care for herself at home.        Past Medical History:  Diagnosis Date  . Asthma   . COPD (chronic obstructive pulmonary disease) ([place])   . Depression   . GERD (gastroesophageal reflux disease)   . Hyperlipidemia   . Hypertension   . Hypothyroidism   . Morbid obesity ([place])   . Osteoarthritis   . PMB (postmenopausal bleeding)   . Sleep apnea   . Vaginal atrophy     Patient Active Problem List   Diagnosis Date Noted  . Acute on chronic diastolic (congestive) heart failure ([place]) 05/05/2020  . Atrial fibrillation ([place])   . Acute CHF (congestive heart failure) ([place]) 08/22/2019  . Community acquired pneumonia 05/26/2016  . Pneumonia 05/26/2016  . Pressure injury of skin 05/26/2016  . Pressure ulcer 03/24/2015  . Cellulitis 03/23/2015  . Vaginal  atrophy 08/19/2014  . Asthma, chronic 04/18/2012  . Obstructive sleep apnea 04/18/2012  . Essential hypertension, benign 04/18/2012  . Hypercholesterolemia 04/18/2012  . Critical aortic valve stenosis 04/18/2012  . Endometrial cancer ([place]) 04/18/2012  . Diverticulosis of colon without hemorrhage 04/18/2012  . Unspecified hypothyroidism 04/18/2012  . Overactive bladder 04/18/2012    Past Surgical History:  Procedure Laterality Date  . ABDOMINAL HYSTERECTOMY  2014   [facility]  . CHOLECYSTECTOMY    . DILATION AND CURETTAGE OF UTERUS    . UMBILICAL HERNIA REPAIR      Prior to Admission medications   Medication Sig Start Date End Date Taking? Authorizing Provider  acetaminophen (TYLENOL) 500 MG tablet Take 500-1,000 mg by mouth every 6 (six) hours as needed for mild pain or fever.     [provider]  albuterol (PROVENTIL HFA;VENTOLIN HFA) 108 (90 BASE) MCG/ACT inhaler Inhale 2 puffs into the lungs every 6 (six) hours as needed for wheezing or shortness of breath.     [provider]  ALPRAZolam Duanne Moron) 0.25 MG tablet Take 1 tablet (0.25 mg total) by mouth 2 (two) times daily as needed for anxiety. 08/28/19   Mikhail, Velta Addison, DO  amLODipine (NORVASC) 5 MG tablet Take 5 mg by mouth daily. 08/04/19   [provider]  apixaban (ELIQUIS) 5 MG TABS tablet Take 1 tablet (5 mg total) by mouth 2 (two) times daily. 08/28/19   Cristal Ford, DO  aspirin 81 MG chewable tablet Chew 81 mg by mouth daily.    [provider]  atorvastatin (LIPITOR) 20 MG tablet Take 1 tablet (20 mg total) by mouth at bedtime. 08/28/19   Mikhail, Velta Addison, DO  benzonatate (TESSALON) 100 MG capsule Take 1 capsule (100 mg total) by mouth 3 (three) times daily. 08/28/19   Mikhail, Velta Addison, DO  Calcium Carbonate-Vitamin D (CALCIUM 600+D) 600-400 MG-UNIT tablet Take 1 tablet by mouth daily after lunch.     [provider]  clotrimazole (LOTRIMIN) 1 % external solution Apply 1 application  topically 2 (two) times daily. 08/28/19   Mikhail, Velta Addison, DO  cyclobenzaprine (FLEXERIL) 10 MG tablet Take 1 tablet (10 mg total) by mouth 3 (three) times daily as needed for muscle spasms. 08/28/19   Mikhail, Velta Addison, DO  docusate sodium (COLACE) 100 MG capsule Take 1 capsule (100 mg total) by mouth 2 (two) times daily as needed for mild constipation. 08/28/19   Mikhail, Velta Addison, DO  furosemide (LASIX) 40 MG tablet Take 1 tablet (40 mg total) by mouth 2 (two) times daily. 08/28/19   Mikhail, Velta Addison, DO  guaiFENesin (MUCINEX) 600 MG 12 hr tablet Take 600 mg by mouth 2 (two) times daily as needed for to loosen phlegm.    [provider]  ipratropium-albuterol (DUONEB) 0.5-2.5 (3) MG/3ML SOLN Take 3 mLs by nebulization 2 (two) times daily. 08/28/19   Mikhail, Velta Addison, DO  levothyroxine (SYNTHROID, LEVOTHROID) 125 MCG tablet Take 125 mcg by mouth daily before breakfast.    [provider]  loratadine (CLARITIN) 10 MG tablet Take 10 mg by mouth daily.    [provider]  nystatin cream (MYCOSTATIN) Apply 1 application topically 2 (two) times daily.  08/05/19   [provider]  polyethylene glycol (MIRALAX / GLYCOLAX) 17 g packet Take 17 g by mouth daily as needed for moderate constipation.    [provider]  Polyvinyl Alcohol-Povidone (REFRESH OP) Place 1 drop into both eyes as needed (for dry eyes).     [provider]  potassium chloride SA (KLOR-CON) 20 MEQ tablet Take 1 tablet (20 mEq total) by mouth daily. 08/28/19   Mikhail, Velta Addison, DO  vitamin B-12 (CYANOCOBALAMIN) 1000 MCG tablet Take 1,000 mcg by mouth daily.    [provider]    Allergies Morphine and related and Vicodin [hydrocodone-acetaminophen]  Family History  Problem Relation Age of Onset  . Alzheimer's disease Mother   . Leukemia Mother   . Brain cancer Brother   . Heart disease Neg Hx   . Diabetes Neg Hx   . Breast cancer Neg Hx   . Ovarian cancer Neg Hx      Social History Social History   Tobacco Use  . Smoking status: Never Smoker  . Smokeless tobacco: Never Used  Substance Use Topics  . Alcohol use: No  . Drug use: No    Review of Systems  Constitutional: No fever/chills Eyes: No visual changes. ENT: No sore throat. Cardiovascular: Denies chest pain. Respiratory: Positive for shortness of breath. Gastrointestinal: No abdominal pain.  No nausea, no vomiting.  No diarrhea.  No constipation. Genitourinary: Negative for dysuria. Musculoskeletal: Negative for back pain.  Positive for leg swelling. Skin: Negative for rash. Neurological: Negative for headaches, focal weakness or numbness.  ____________________________________________   PHYSICAL EXAM:  VITAL SIGNS: ED Triage Vitals  Enc Vitals Group     BP 05/05/20 1340 (!) 120/54     Pulse Rate 05/05/20 1340 88  Resp 05/05/20 1340 (!) 24     Temp 05/05/20 1340 98.1 F (36.7 C)     Temp Source 05/05/20 1340 Oral     SpO2 05/05/20 1340 99 %     Weight --      Height --      Head Circumference --      Peak Flow --      Pain Score 05/05/20 1404 7     Pain Loc --      Pain Edu? --      Excl. in [place]? --     Constitutional: Alert and oriented. Eyes: Conjunctivae are normal. Head: Atraumatic. Nose: No congestion/rhinnorhea. Mouth/Throat: Mucous membranes are moist. Neck: Normal ROM Cardiovascular: Normal rate, regular rhythm. Grossly normal heart sounds. Respiratory: Normal respiratory effort.  No retractions. Lungs CTAB. Gastrointestinal: Soft and nontender. No distention. Genitourinary: deferred Musculoskeletal: No lower extremity tenderness, 2+ pitting edema to mid thighs bilaterally.  Small area of erythema to medial portion of left lower leg with no tenderness, warmth, or purulent drainage. Neurologic:  Normal speech and language. No gross focal neurologic deficits are appreciated. Skin:  Skin is warm, dry and intact. No rash noted. Psychiatric: Mood and  affect are normal. Speech and behavior are normal.  ____________________________________________   LABS (all labs ordered are listed, but only abnormal results are displayed)  Labs Reviewed  BASIC METABOLIC PANEL - Abnormal; Notable for the following components:      Result Value   Chloride 97 (*)    Glucose, Bld 118 (*)    All other components within normal limits  BRAIN NATRIURETIC PEPTIDE - Abnormal; Notable for the following components:   B Natriuretic Peptide 465.1 (*)    All other components within normal limits  TROPONIN I (HIGH SENSITIVITY) - Abnormal; Notable for the following components:   Troponin I (High Sensitivity) 20 (*)    All other components within normal limits  SARS CORONAVIRUS 2 (TAT 6-24 HRS)  CBC  LACTIC ACID, PLASMA   ____________________________________________   PROCEDURES  Procedure(s) performed (including Critical Care):  Procedures   ____________________________________________   INITIAL IMPRESSION / ASSESSMENT AND PLAN / ED COURSE       80 year old female with past medical history of hypertension, hyperlipidemia, asthma, CHF, and atrial fibrillation who presents to the ED for increasing lower extremity swelling and discomfort causing her to be unable to walk.  Patient reports some dyspnea on exertion but is not in any respiratory distress at rest, maintaining O2 sats on room air.  She has significant edema to her bilateral lower extremities that appears most consistent with fluid.  We will treat with IV Lasix but also assess for DVT with ultrasound.  She has a small area of erythema but does not appear consistent with cellulitis but more likely related to skin changes related to chronic fluid overload.  We will hold off on antibiotics for now, especially given normal white blood cell count and lactic acid.  Ultrasound is negative for DVT, additional labs remarkable for mildly elevated BNP.  Given patient's fluid overload with inability to  ambulate, case discussed with hospitalist for admission.      ____________________________________________   FINAL CLINICAL IMPRESSION(S) / ED DIAGNOSES  Final diagnoses:  Acute on chronic congestive heart failure, unspecified heart failure type [facility])  Lower extremity edema     ED Discharge Orders    None       Note:  This document was prepared using Dragon voice recognition software and may include  unintentional dictation errors.   Blake Divine, MD 05/05/20 [phone]

## 2020-05-05 NOTE — H&P (Signed)
History and Physical    [name] [facility] POE:[id number] DOB: 02-21-1941 DOA: 05/05/2020  PCP: System, Provider Not In  Patient coming from: Home via EMS  I have personally briefly reviewed patient's old medical records in [place]  Chief Complaint: Lower extremity swelling  HPI: [name] is a 80 y.o. female with medical history significant for chronic diastolic CHF (EF 31-54%), severe aortic stenosis, chronic atrial fibrillation on Eliquis, asthma, hypertension, hyperlipidemia, hypothyroidism, depression/anxiety, who is currently active with hospice and presents to the ED for evaluation of progressive lower extremity edema.  Patient states she has been having progressive lower extremity edema in both of her legs over the last several weeks, significantly worse than her baseline.  She has seen some redness of the skin at the lower part of both legs.  She has been having shortness of breath with minimal exertion as well as orthopnea.  She says she normally ambulates with the use of a walker but now requires assistance to transfer and that her mobility is significantly impaired.  She denies any chest pain or palpitations.  She reports good urine output.  She says she takes Lasix 40 mg twice a day.  She says she was previously taking Eliquis however has not had it over the last few months due to cost issues.  ED Course:  Initial vitals showed BP 120/54, pulse 88, RR 24, temp 98.1 F, SPO2 99% on room air.  Labs show sodium 135, potassium 3.9, bicarb 28, BUN 12, creatinine 0.80, serum glucose 118, WBC 10.3, hemoglobin 13.2, platelets 262,000, high-sensitivity troponin I 20, BNP 465.1, lactic acid 1.4.  SARS-CoV-2 PCR is collected and pending.  2 view chest x-ray shows stable small pleural effusion and/or atelectasis at the left lung base, no acute focal consolidation, edema.  Bilateral lower extremity ultrasounds are negative for evidence of DVT.  Patient was given IV Lasix  40 mg once and the hospitalist service was consulted to admit for further evaluation and management.  Review of Systems: All systems reviewed and are negative except as documented in history of present illness above.   Past Medical History:  Diagnosis Date  . Asthma   . COPD (chronic obstructive pulmonary disease) ([place])   . Depression   . GERD (gastroesophageal reflux disease)   . Hyperlipidemia   . Hypertension   . Hypothyroidism   . Morbid obesity ([place])   . Osteoarthritis   . PMB (postmenopausal bleeding)   . Sleep apnea   . Vaginal atrophy     Past Surgical History:  Procedure Laterality Date  . ABDOMINAL HYSTERECTOMY  2014   [facility]  . CHOLECYSTECTOMY    . DILATION AND CURETTAGE OF UTERUS    . UMBILICAL HERNIA REPAIR      Social History:  reports that she has never smoked. She has never used smokeless tobacco. She reports that she does not drink alcohol and does not use drugs.  Allergies  Allergen Reactions  . Morphine And Related   . Vicodin [Hydrocodone-Acetaminophen] Other (See Comments)    Reaction:  Raises pts BP     Family History  Problem Relation Age of Onset  . Alzheimer's disease Mother   . Leukemia Mother   . Brain cancer Brother   . Heart disease Neg Hx   . Diabetes Neg Hx   . Breast cancer Neg Hx   . Ovarian cancer Neg Hx      Prior to Admission medications   Medication Sig Start Date End Date  Taking? Authorizing Provider  acetaminophen (TYLENOL) 500 MG tablet Take 500-1,000 mg by mouth every 6 (six) hours as needed for mild pain or fever.     [provider]  albuterol (PROVENTIL HFA;VENTOLIN HFA) 108 (90 BASE) MCG/ACT inhaler Inhale 2 puffs into the lungs every 6 (six) hours as needed for wheezing or shortness of breath.     [provider]  ALPRAZolam Duanne Moron) 0.25 MG tablet Take 1 tablet (0.25 mg total) by mouth 2 (two) times daily as needed for anxiety. 08/28/19   Mikhail, Velta Addison, DO  amLODipine (NORVASC) 5 MG tablet Take 5  mg by mouth daily. 08/04/19   [provider]  apixaban (ELIQUIS) 5 MG TABS tablet Take 1 tablet (5 mg total) by mouth 2 (two) times daily. 08/28/19   Cristal Ford, DO  aspirin 81 MG chewable tablet Chew 81 mg by mouth daily.    [provider]  atorvastatin (LIPITOR) 20 MG tablet Take 1 tablet (20 mg total) by mouth at bedtime. 08/28/19   Mikhail, Velta Addison, DO  benzonatate (TESSALON) 100 MG capsule Take 1 capsule (100 mg total) by mouth 3 (three) times daily. 08/28/19   Mikhail, Velta Addison, DO  Calcium Carbonate-Vitamin D (CALCIUM 600+D) 600-400 MG-UNIT tablet Take 1 tablet by mouth daily after lunch.     [provider]  clotrimazole (LOTRIMIN) 1 % external solution Apply 1 application topically 2 (two) times daily. 08/28/19   Mikhail, Velta Addison, DO  cyclobenzaprine (FLEXERIL) 10 MG tablet Take 1 tablet (10 mg total) by mouth 3 (three) times daily as needed for muscle spasms. 08/28/19   Mikhail, Velta Addison, DO  docusate sodium (COLACE) 100 MG capsule Take 1 capsule (100 mg total) by mouth 2 (two) times daily as needed for mild constipation. 08/28/19   Mikhail, Velta Addison, DO  furosemide (LASIX) 40 MG tablet Take 1 tablet (40 mg total) by mouth 2 (two) times daily. 08/28/19   Mikhail, Velta Addison, DO  guaiFENesin (MUCINEX) 600 MG 12 hr tablet Take 600 mg by mouth 2 (two) times daily as needed for to loosen phlegm.    [provider]  ipratropium-albuterol (DUONEB) 0.5-2.5 (3) MG/3ML SOLN Take 3 mLs by nebulization 2 (two) times daily. 08/28/19   Mikhail, Velta Addison, DO  levothyroxine (SYNTHROID, LEVOTHROID) 125 MCG tablet Take 125 mcg by mouth daily before breakfast.    [provider]  loratadine (CLARITIN) 10 MG tablet Take 10 mg by mouth daily.    [provider]  nystatin cream (MYCOSTATIN) Apply 1 application topically 2 (two) times daily.  08/05/19   [provider]  polyethylene glycol (MIRALAX / GLYCOLAX) 17 g packet Take 17 g by mouth daily as needed for  moderate constipation.    [provider]  Polyvinyl Alcohol-Povidone (REFRESH OP) Place 1 drop into both eyes as needed (for dry eyes).     [provider]  potassium chloride SA (KLOR-CON) 20 MEQ tablet Take 1 tablet (20 mEq total) by mouth daily. 08/28/19   Mikhail, Velta Addison, DO  vitamin B-12 (CYANOCOBALAMIN) 1000 MCG tablet Take 1,000 mcg by mouth daily.    [provider]    Physical Exam: Vitals:   05/05/20 1340 05/05/20 1702 05/05/20 1937  BP: (!) 120/54 110/74   Pulse: 88 82 85  Resp: (!) 24 20 18   Temp: 98.1 F (36.7 C)    TempSrc: Oral    SpO2: 99% 96%    Constitutional: Morbidly obese elderly woman resting in bed with head elevated, NAD, calm, comfortable Eyes: PERRL, lids  and conjunctivae normal ENMT: Mucous membranes are moist. Posterior pharynx clear of any exudate or lesions.Normal dentition.  Neck: normal, supple, no masses. Respiratory: Faint bibasilar inspiratory crackles. Normal respiratory effort. No accessory muscle use.  Cardiovascular: Regular rate and rhythm, 2/6 systolic murmur.  Massive bilateral lower extremity edema. Abdomen: no tenderness, no masses palpated. No hepatosplenomegaly. Bowel sounds positive.  Musculoskeletal: no clubbing / cyanosis. No joint deformity upper and lower extremities. ROM diminished bilateral lower extremities due to body habitus, no contractures. Normal muscle tone.  Skin: no rashes, lesions, ulcers. No induration Neurologic: CN 2-12 grossly intact. Sensation intact. Strength equal throughout. Psychiatric: Normal judgment and insight. Alert and oriented x 3. Normal mood.   Labs on Admission: I have personally reviewed following labs and imaging studies  CBC: Recent Labs  Lab 05/05/20 1400  WBC 10.3  HGB 13.2  HCT 39.5  MCV 89.6  PLT 937   Basic Metabolic Panel: Recent Labs  Lab 05/05/20 1400  NA 135  K 3.9  CL 97*  CO2 28  GLUCOSE 118*  BUN 12  CREATININE 0.80  CALCIUM 9.3    GFR: CrCl cannot be calculated (Unknown ideal weight.). Liver Function Tests: No results for input(s): AST, ALT, ALKPHOS, BILITOT, PROT, ALBUMIN in the last 168 hours. No results for input(s): LIPASE, AMYLASE in the last 168 hours. No results for input(s): AMMONIA in the last 168 hours. Coagulation Profile: No results for input(s): INR, PROTIME in the last 168 hours. Cardiac Enzymes: No results for input(s): CKTOTAL, CKMB, CKMBINDEX, TROPONINI in the last 168 hours. BNP (last 3 results) No results for input(s): PROBNP in the last 8760 hours. HbA1C: No results for input(s): HGBA1C in the last 72 hours. CBG: No results for input(s): GLUCAP in the last 168 hours. Lipid Profile: No results for input(s): CHOL, HDL, LDLCALC, TRIG, CHOLHDL, LDLDIRECT in the last 72 hours. Thyroid Function Tests: No results for input(s): TSH, T4TOTAL, FREET4, T3FREE, THYROIDAB in the last 72 hours. Anemia Panel: No results for input(s): VITAMINB12, FOLATE, FERRITIN, TIBC, IRON, RETICCTPCT in the last 72 hours. Urine analysis:    Component Value Date/Time   COLORURINE STRAW (A) 09/01/2016 2350   APPEARANCEUR CLEAR (A) 09/01/2016 2350   APPEARANCEUR Clear 10/12/2013 0500   LABSPEC 1.004 (L) 09/01/2016 2350   LABSPEC 1.003 10/12/2013 0500   PHURINE 6.0 09/01/2016 2350   GLUCOSEU NEGATIVE 09/01/2016 2350   GLUCOSEU Negative 10/12/2013 0500   GLUCOSEU NEGATIVE 04/25/2012 0949   HGBUR SMALL (A) 09/01/2016 2350   BILIRUBINUR NEGATIVE 09/01/2016 2350   BILIRUBINUR Negative 10/12/2013 0500   KETONESUR NEGATIVE 09/01/2016 2350   PROTEINUR NEGATIVE 09/01/2016 2350   UROBILINOGEN 0.2 04/25/2012 0949   NITRITE NEGATIVE 09/01/2016 2350   LEUKOCYTESUR NEGATIVE 09/01/2016 2350   LEUKOCYTESUR 1+ 10/12/2013 0500    Radiological Exams on Admission: DG Chest 2 View  Result Date: 05/05/2020 CLINICAL DATA:  Shortness of breath, redness and swelling to LEFT lower extremity. EXAM: CHEST - 2 VIEW COMPARISON:  Chest  x-rays dated 08/22/2019 09/01/2016. FINDINGS: Heart size and mediastinal contours are stable. Small pleural effusion and/or atelectasis at the LEFT lung base, stable and likely chronic. No new lung findings. No pneumothorax. No acute appearing osseous abnormality. IMPRESSION: No active cardiopulmonary disease. No evidence of pneumonia or pulmonary edema. Electronically Signed   By: Franki Cabot M.D.   On: 05/05/2020 18:29   US Venous Img Lower Bilateral  Result Date: 05/05/2020 CLINICAL DATA:  Shortness of breath, lower extremity pain and edema EXAM: BILATERAL LOWER  EXTREMITY VENOUS DOPPLER ULTRASOUND TECHNIQUE: Gray-scale sonography with compression, as well as color and duplex ultrasound, were performed to evaluate the deep venous system(s) from the level of the common femoral vein through the popliteal and proximal calf veins. COMPARISON:  06/25/2014 FINDINGS: VENOUS Normal compressibility of the common femoral, superficial femoral, and popliteal veins, as well as the visualized calf veins. Visualized portions of profunda femoral vein and great saphenous vein unremarkable. No filling defects to suggest DVT on grayscale or color Doppler imaging. Doppler waveforms show normal direction of venous flow, normal respiratory plasticity and response to augmentation. OTHER None. Limitations: none IMPRESSION: 1. No evidence of deep venous thrombosis within either lower extremity. Electronically Signed   By: Randa Ngo M.D.   On: 05/05/2020 19:03    EKG: Personally reviewed. Atrial fibrillation with PVC.  Not significantly changed when compared to prior.  Assessment/Plan Principal Problem:   Acute on chronic diastolic (congestive) heart failure (HCC) Active Problems:   Asthma, chronic   Essential hypertension, benign   Hypercholesterolemia   Hypothyroidism   Atrial fibrillation ([place])  [name] is a 80 y.o. female with medical history significant for chronic diastolic CHF (EF 65-68%), severe  aortic stenosis, chronic atrial fibrillation on Eliquis, asthma, hypertension, hyperlipidemia, hypothyroidism, depression/anxiety, who is admitted with acute on chronic diastolic CHF.  Acute on chronic diastolic CHF exacerbation: Progressive over several weeks with increased lower extremity edema, orthopnea, DOE. Last EF 60-65% by TTE 08/24/2019. -Continue IV Lasix 40 mg twice daily -Supplement potassium -Monitor strict I/O's and daily weights  Chronic atrial fibrillation: Remains in atrial fibrillation with controlled rate. Patient states she stopped Eliquis several months ago due to financial issues. CHA2DS2-VASc score is at least 5. -Resume Eliquis 5 mg twice daily -TOC consult for medication needs  Asthma: Stable without acute exacerbation. Continue home DuoNeb and as needed albuterol.  Hypertension: Holding home amlodipine with soft blood pressure on admission.  Hypothyroidism: Continue Synthroid.  Hyperlipidemia: Continue atorvastatin.  Severe aortic stenosis: Noted on TTE 08/24/2019.  Per documentation, at that time patient refused left and right heart cath and elected for palliative/hospice care.  Deconditioning/diminished mobility: Request PT/OT eval. Anticipate will need placement on discharge.  DVT prophylaxis: Eliquis Code Status: DNR, confirmed with patient Family Communication: Discussed with patient, she has discussed with family Disposition Plan: From home, likely discharge to ALF/SNF Consults called: None Level of care: Med-Surg Admission status:  Status is: Observation  The patient remains OBS appropriate and will d/c before 2 midnights.  Dispo: The patient is from: Home              Anticipated d/c is to: SNF              Patient currently is not medically stable to d/c.  Zada Finders MD [facility]  If 7PM-7AM, please contact night-coverage [email or web address]  05/05/2020, 7:43 PM

## 2020-05-05 NOTE — ED Notes (Signed)
Pt cleaned and placed on purewick.   Pt noted to have bed sores on buttock, barrier cream placed.

## 2020-05-06 ENCOUNTER — Encounter: Payer: Self-pay | Admitting: Internal Medicine

## 2020-05-06 DIAGNOSIS — I5033 Acute on chronic diastolic (congestive) heart failure: Secondary | ICD-10-CM | POA: Diagnosis not present

## 2020-05-06 LAB — BASIC METABOLIC PANEL
Anion gap: 11 (ref 5–15)
BUN: 12 mg/dL (ref 8–23)
CO2: 27 mmol/L (ref 22–32)
Calcium: 9.1 mg/dL (ref 8.9–10.3)
Chloride: 98 mmol/L (ref 98–111)
Creatinine, Ser: 0.91 mg/dL (ref 0.44–1.00)
GFR, Estimated: >60 mL/min (ref >60)
Glucose, Bld: 149 mg/dL — ABNORMAL HIGH (ref 70–99)
Potassium: 4.1 mmol/L (ref 3.5–5.1)
Sodium: 136 mmol/L (ref 135–145)

## 2020-05-06 LAB — CBC
HCT: 37.9 % (ref 36.0–46.0)
Hemoglobin: 12.5 g/dL (ref 12.0–15.0)
MCH: 29.5 pg (ref 26.0–34.0)
MCHC: 33 g/dL (ref 30.0–36.0)
MCV: 89.4 fL (ref 80.0–100.0)
Platelets: 267 10*3/uL (ref 150–400)
RBC: 4.24 MIL/uL (ref 3.87–5.11)
RDW: 15.2 % (ref 11.5–15.5)
WBC: 9.3 10*3/uL (ref 4.0–10.5)
nRBC: 0 % (ref 0.0–0.2)

## 2020-05-06 LAB — SARS CORONAVIRUS 2 (TAT 6-24 HRS): SARS Coronavirus 2: NEGATIVE

## 2020-05-06 LAB — MAGNESIUM: Magnesium: 1.9 mg/dL (ref 1.7–2.4)

## 2020-05-06 LAB — TSH: TSH: 3.333 u[IU]/mL (ref 0.350–4.500)

## 2020-05-06 MED ORDER — ZINC OXIDE 40 % EX OINT
TOPICAL_OINTMENT | CUTANEOUS | Status: DC | PRN
  Administered 2020-05-10: 1 via TOPICAL
  Filled 2020-05-06 (×3): qty 113

## 2020-05-06 MED ORDER — NYSTATIN 100000 UNIT/GM EX POWD
Freq: Three times a day (TID) | CUTANEOUS | Status: DC
  Filled 2020-05-06: qty 15

## 2020-05-06 MED ORDER — HYDROCORTISONE 0.5 % EX CREA
TOPICAL_CREAM | CUTANEOUS | Status: DC | PRN
  Administered 2020-05-09: 1 via TOPICAL
  Filled 2020-05-06 (×2): qty 28.35

## 2020-05-06 NOTE — ED Notes (Signed)
Assisted pt to reposition in bed and given blanket

## 2020-05-06 NOTE — Progress Notes (Signed)
[place] Room [address] 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 She activated EMS 03.02 after contacting her hospice RN for complaints of lower extremity edema and redness for several weeks and shortness of breath with exertion more than usual. She was admitted to [facility] on 03.02 at 1942 with at diagnosis of CHF exacerbation. Per Dr. Gilford Rile with [facility], this is a related admission. Patient is a DNR.  Visited patient at bedside. She was sitting up in bed. Alert and oriented. Denied any complaints of pain or SOB. Lower extremities are 4+ edematous and red, shiny and warm to touch. Discussed patient's plans and goals moving forward. She states she is not sure but knows that she cannot go back home like she is. PT and OT to eval today.  Vital Signs: BP 125/73, HR 80, Resp 22, Temp 97.6, O2 sat 94% on RA  I&O: 125ml/none charted  Abnormal labs: Glucose: 149 (H)  Diagnostics: CXR IMPRESSION: No active cardiopulmonary disease. No evidence of pneumonia or pulmonary edema.  US IMPRESSION: 1. No evidence of deep venous thrombosis within either lower Extremity.  IV/PRN Meds: furosemide (LASIX) injection 40 mg Dose: 40 mg Freq: Every 12 hours Route: IV Start: 05/06/20 0500  ipratropium-albuterol (DUONEB) 0.5-2.5 (3) MG/3ML nebulizer solution 3 mL Dose: 3 mL Freq: 2 times daily Route: NEBULIZATION Start: 05/05/20 2200  Problem List: Principal Problem:   Acute on chronic diastolic (congestive) heart failure (HCC) Active Problems:   Asthma, chronic   Essential hypertension, benign   Hypercholesterolemia   Hypothyroidism   Atrial fibrillation ([place])  Acute on chronic diastolic CHF exacerbation: Progressive over several weeks with increased lower extremity edema, orthopnea, DOE. Last EF 60-65% by  TTE 08/24/2019. -3/3-still with quite LE edema. Continue Lasix 40 mg IV twice daily  monitor renal function and electrolytes Strict I's and O's Daily weight  Chronic atrial fibrillation: Remains in atrial fibrillation with controlled rate. Patient states she stopped Eliquis several months ago due to financial issues. CHA2DS2-VASc score is at least 5. -3/3-Eliquis  TOC consult for medication needs   Asthma: Stable without acute exacerbation  Continue MDIs and duo nebs   Hypertension: Hold amlodipine as BP on low side   Hypothyroidism: Continue Synthroid.  Hyperlipidemia: Continue atorvastatin.  Severe aortic stenosis: Noted on TTE 08/24/2019. Per documentation, at that time patient refused left and right heart cath and elected for palliative/hospice care.  Deconditioning/diminished mobility: PT OT recommend SNF  DVT prophylaxis: Eliquis  Discharge Planning: Ongoing  Family contact: Spoke with patient in room. She requested that [facility] RN not call her sons at this time.  IDG: Updated  Goals of Care: Clear  Medication list and Transfer Summary placed on Shadow Chart.  Please call with any hospice related questions or concerns.  Zandra Abts, RN [facility] Liaison [phone]

## 2020-05-06 NOTE — Evaluation (Signed)
Physical Therapy Evaluation Patient Details Name: [name] [facility] MRN: [id number] DOB: [birth year] Today's Date: 05/06/2020   History of Present Illness  [name] is a 80 y.o. female with medical history significant for chronic diastolic CHF (EF 37-85%), severe aortic stenosis, chronic atrial fibrillation on Eliquis, asthma, hypertension, hyperlipidemia, hypothyroidism, depression/anxiety, who is currently active with hospice and presents to the ED for evaluation of progressive lower extremity edema.  Clinical Impression  Patient received in bed, reports she was taking a nap. She reports she did not sleep at all last night. Patient requires +2-3 max assist for rolling in bed. She declines further mobility at this time. Patient has had a functional decline in the last few months and has been unable to walk at all requiring a good deal of assist from her husband who has alzheimer's. Patient will continue to benefit from skilled PT while here to improve strength, functional independence and functional mobility.     Follow Up Recommendations SNF    Equipment Recommendations  Other (comment);None recommended by PT (TBD)    Recommendations for Other Services       Precautions / Restrictions Precautions Precautions: Fall Restrictions Weight Bearing Restrictions: No      Mobility  Bed Mobility Overal bed mobility: Needs Assistance Bed Mobility: Rolling Rolling: Max assist;+2 for physical assistance         General bed mobility comments: Patient requires +2-3 for rolling in bed, patient would not attempt sitting up on side of bed.    Transfers                 General transfer comment: unable  Ambulation/Gait             General Gait Details: unable  Stairs            Wheelchair Mobility    Modified Rankin (Stroke Patients Only)       Balance                                             Pertinent Vitals/Pain Pain  Assessment: No/denies pain    Home Living Family/patient expects to be discharged to:: Skilled nursing facility Living Arrangements: Spouse/significant other Available Help at Discharge: Family;Available PRN/intermittently Type of Home: House Home Access: Ramped entrance     Home Layout: One level Home Equipment: Walker - 2 wheels;Bedside commode      Prior Function Level of Independence: Needs assistance   Gait / Transfers Assistance Needed: ambulates w/ RW,  needs assist from spouse to get out of lift recliner; has power w/c but too big for in home use  ADL's / Homemaking Assistance Needed: sleeps in recliner, uses pads & towels between legs to urinate from recliner, uses BSC over toilet for BMs. Eats meals out of cans typically, manages meds for herself and spouse; niece helps prep meals, calls groceries in and delivered, re-organizes clutter that pt's spouse leaves, aide comes 3d/wk, 3 hrs/day (M, W, F) to prep pre-prepared meals, assists w/ showering; spouse does some cleaning and laundry  Comments: Pt reports having been homebound for past 12years with no access to transportation with w/c lift; no falls in past 12 months     Hand Dominance   Dominant Hand: Right    Extremity/Trunk Assessment   Upper Extremity Assessment Upper Extremity Assessment: Defer to OT evaluation    Lower Extremity  Assessment Lower Extremity Assessment: Generalized weakness       Communication   Communication: No difficulties  Cognition Arousal/Alertness: Awake/alert Behavior During Therapy: WFL for tasks assessed/performed Overall Cognitive Status: Within Functional Limits for tasks assessed                                        General Comments      Exercises     Assessment/Plan    PT Assessment Patient needs continued PT services  PT Problem List Decreased strength;Decreased activity tolerance;Decreased mobility;Cardiopulmonary status limiting activity        PT Treatment Interventions Therapeutic exercise;Functional mobility training;Therapeutic activities;Patient/family education    PT Goals (Current goals can be found in the Care Plan section)  Acute Rehab PT Goals Patient Stated Goal: to improve mobility PT Goal Formulation: With patient Time For Goal Achievement: 05/20/20 Potential to Achieve Goals: Fair    Frequency Min 2X/week   Barriers to discharge Decreased caregiver support      Co-evaluation PT/OT/SLP Co-Evaluation/Treatment: Yes Reason for Co-Treatment: For patient/therapist safety PT goals addressed during session: Mobility/safety with mobility         AM-PAC PT "6 Clicks" Mobility  Outcome Measure Help needed turning from your back to your side while in a flat bed without using bedrails?: Total Help needed moving from lying on your back to sitting on the side of a flat bed without using bedrails?: Total Help needed moving to and from a bed to a chair (including a wheelchair)?: Total Help needed standing up from a chair using your arms (e.g., wheelchair or bedside chair)?: Total Help needed to walk in hospital room?: Total Help needed climbing 3-5 steps with a railing? : Total 6 Click Score: 6    End of Session   Activity Tolerance: Patient limited by fatigue Patient left: in bed;with call bell/phone within reach Nurse Communication: Mobility status PT Visit Diagnosis: Other abnormalities of gait and mobility (R26.89);Muscle weakness (generalized) (M62.81)    Time: 8786-7672 PT Time Calculation (min) (ACUTE ONLY): 15 min   Charges:   PT Evaluation $PT Eval Moderate Complexity: 1 Mod          Domanique Luckett, PT, GCS 05/06/20,1:22 PM

## 2020-05-06 NOTE — Evaluation (Signed)
Occupational Therapy Evaluation Patient Details Name: [name] [facility] MRN: [id number] DOB: 23-May-1940 Today's Date: 05/06/2020    History of Present Illness [name] is a 80 y.o. female with medical history significant for chronic diastolic CHF (EF 38-88%), severe aortic stenosis, chronic atrial fibrillation on Eliquis, asthma, hypertension, hyperlipidemia, hypothyroidism, depression/anxiety, who is currently active with hospice and presents to the ED for evaluation of progressive lower extremity edema.   Clinical Impression   Pt was seen for OT and PT co-evaluations this date. Prior to hospital admission, pt was primarily homebound, sleeping and sitting in lift recliner for the majority of her day. Pt reports living with her spouse who has Alzheimer's and requires his assist to physically get out of the lift recliner to/from [facility] but has been increasingly unable to do this over the past few months. Currently pt demonstrates impairments as described below (See OT problem list) which functionally limit her ability to perform ADL/self-care tasks. Pt currently requires MAX A x2-3 for any ADL mobility attempts, MAX A for LB ADL, and MIN A for UB ADL tasks. Despite encouragement, pt declined OOB/EOB attempts 2/2 fatigue and not having had food in past 24hrs. Pt agreeable to trying next session. Given more recent functional decline requiring increased caregiver burden and risk of falls, Pt would benefit from skilled OT services to address noted impairments and functional limitations (see below for any additional details) in order to maximize safety and independence while minimizing falls risk and caregiver burden. Upon hospital discharge, recommend STR to maximize pt safety and return to PLOF.     Follow Up Recommendations  SNF    Equipment Recommendations  Other (comment) (bari RW, bari [facility])    Recommendations for Other Services       Precautions / Restrictions Precautions Precautions:  Fall Restrictions Weight Bearing Restrictions: No      Mobility Bed Mobility Overal bed mobility: Needs Assistance Bed Mobility: Rolling Rolling: Max assist;+2 for physical assistance         General bed mobility comments: Patient requires +2-3 for rolling in bed, patient would not attempt sitting up on side of bed.    Transfers                 General transfer comment: unable/declined    Balance                                           ADL either performed or assessed with clinical judgement   ADL Overall ADL's : Needs assistance/impaired                                       General ADL Comments: MAX A for LB ADL from bed level, set up and MIN A for UB ADL tasks, MAX A +2 for bed level toileting     Vision Patient Visual Report: No change from baseline       Perception     Praxis      Pertinent Vitals/Pain Pain Assessment: No/denies pain     Hand Dominance Right   Extremity/Trunk Assessment Upper Extremity Assessment Upper Extremity Assessment: Generalized weakness   Lower Extremity Assessment Lower Extremity Assessment: Generalized weakness       Communication Communication Communication: No difficulties   Cognition Arousal/Alertness: Awake/alert Behavior During Therapy:  WFL for tasks assessed/performed Overall Cognitive Status: Within Functional Limits for tasks assessed                                     General Comments       Exercises Other Exercises Other Exercises: pt instructed in role of therapy and benefit of bed level movement to support strengthening and activity tolerance   Shoulder Instructions      Home Living Family/patient expects to be discharged to:: Skilled nursing facility Living Arrangements: Spouse/significant other Available Help at Discharge: Family;Available PRN/intermittently (spouse has Alzheimer's per pt report) Type of Home: House Home Access: Ramped  entrance     Home Layout: One level     Bathroom Shower/Tub: Occupational psychologist: Handicapped height     Home Equipment: Environmental consultant - 2 wheels;Bedside commode          Prior Functioning/Environment Level of Independence: Needs assistance  Gait / Transfers Assistance Needed: ambulates w/ RW,  needs assist from spouse to get out of lift recliner; has power w/c but too big for in home use ADL's / Homemaking Assistance Needed: sleeps in recliner, uses pads & towels between legs to urinate from recliner, uses BSC over toilet for BMs. Eats meals out of cans typically, manages meds for herself and spouse; niece helps prep meals, calls groceries in and delivered, re-organizes clutter that pt's spouse leaves, aide previously used to come 3d/wk, 3 hrs/day (M, W, F) to prep pre-prepared meals, assists w/ showering; spouse does some cleaning and laundry. Pt reports previous PCA passed away and hospice RN has been coming daily to assist with bathing and other ADL/IADL   Comments: Pt reports having been homebound for past 12years with no access to transportation with w/c lift; no falls in past 12 months        OT Problem List: Decreased strength;Increased edema;Decreased activity tolerance;Impaired balance (sitting and/or standing);Decreased knowledge of use of DME or AE;Obesity      OT Treatment/Interventions: Self-care/ADL training;Therapeutic exercise;Therapeutic activities;DME and/or AE instruction;Patient/family education;Balance training    OT Goals(Current goals can be found in the care plan section) Acute Rehab OT Goals Patient Stated Goal: get stronger and be able to transfer again OT Goal Formulation: With patient Time For Goal Achievement: 05/20/20 Potential to Achieve Goals: Fair ADL Goals Pt Will Perform Lower Body Dressing: sit to/from stand;with adaptive equipment;with min assist Pt Will Transfer to Toilet: with mod assist;bedside commode;stand pivot transfer  (LRAD) Additional ADL Goal #1: Pt will verbalize plan to implement at least 1 learned falls prevention strategy to maximize safety  OT Frequency: Min 1X/week   Barriers to D/C:            Co-evaluation PT/OT/SLP Co-Evaluation/Treatment: Yes Reason for Co-Treatment: For patient/therapist safety PT goals addressed during session: Mobility/safety with mobility OT goals addressed during session: ADL's and self-care      AM-PAC OT "6 Clicks" Daily Activity     Outcome Measure Help from another person eating meals?: None Help from another person taking care of personal grooming?: A Little Help from another person toileting, which includes using toliet, bedpan, or urinal?: Total Help from another person bathing (including washing, rinsing, drying)?: A Lot Help from another person to put on and taking off regular upper body clothing?: A Little Help from another person to put on and taking off regular lower body clothing?: A Lot 6 Click Score: 15  End of Session    Activity Tolerance: Patient limited by fatigue (pt reports fatigue from not eating in 24hr) Patient left: in bed;with call bell/phone within reach;with bed alarm set  OT Visit Diagnosis: Other abnormalities of gait and mobility (R26.89);History of falling (Z91.81);Muscle weakness (generalized) (M62.81)                Time: 1884-1660 OT Time Calculation (min): 14 min Charges:  OT General Charges $OT Visit: 1 Visit OT Evaluation $OT Eval Moderate Complexity: 1 Mod  Hanley Hays, MPH, MS, OTR/L ascom [phone] 05/06/20, 1:46 PM

## 2020-05-06 NOTE — Progress Notes (Signed)
OT Cancellation Note  Patient Details Name: [name] MRN: [id number] DOB: [birth year]   Cancelled Treatment:    Reason Eval/Treat Not Completed: Patient at procedure or test/ unavailable. Consult received, chart reviewed. Pt moved from ED to [facility] upon attempt. Currently being assessed by nursing with admission to floor. Per chart for previous admission, pt was hospice. Secure chat sent to case mgt. Will re-attempt OT evaluation at later time as appropriate.   Hanley Hays, MPH, MS, OTR/L ascom [phone] 05/06/20, 10:15 AM

## 2020-05-06 NOTE — NC FL2 (Signed)
[place] LEVEL OF CARE SCREENING TOOL     IDENTIFICATION  Patient Name: [name] [facility] Birthdate: Jun 10, 1940 Sex: female Admission Date (Current Location): 05/05/2020  [facility] and [place] Number:  Engineering geologist and Address:         Provider Number: [id number]  Attending Physician Name and Address:  Nolberto Hanlon, MD  Relative Name and Phone Number:       Current Level of Care: Hospital Recommended Level of Care: [employer] Prior Approval Number:    Date Approved/Denied:   PASRR Number: [id number]  Discharge Plan: SNF    Current Diagnoses: Patient Active Problem List   Diagnosis Date Noted  . Acute on chronic diastolic (congestive) heart failure ([place]) 05/05/2020  . Atrial fibrillation ([place])   . Acute CHF (congestive heart failure) ([place]) 08/22/2019  . Community acquired pneumonia 05/26/2016  . Pneumonia 05/26/2016  . Pressure injury of skin 05/26/2016  . Pressure ulcer 03/24/2015  . Cellulitis 03/23/2015  . Vaginal atrophy 08/19/2014  . Asthma, chronic 04/18/2012  . Obstructive sleep apnea 04/18/2012  . Essential hypertension, benign 04/18/2012  . Hypercholesterolemia 04/18/2012  . Critical aortic valve stenosis 04/18/2012  . Endometrial cancer ([place]) 04/18/2012  . Diverticulosis of colon without hemorrhage 04/18/2012  . Hypothyroidism 04/18/2012  . Overactive bladder 04/18/2012    Orientation RESPIRATION BLADDER Height & Weight     Time,Situation,Place,Self  Normal Continent Weight: (!) 165.2 kg Height:  5\' 5"  (165.1 cm)  BEHAVIORAL SYMPTOMS/MOOD NEUROLOGICAL BOWEL NUTRITION STATUS      Continent Diet (Heart Healthy)  AMBULATORY STATUS COMMUNICATION OF NEEDS Skin   Extensive Assist Verbally Other (Comment) (Cellulitis)                       Personal Care Assistance Level of Assistance              Functional Limitations Info             SPECIAL CARE FACTORS FREQUENCY  OT (By licensed OT),PT (By  licensed PT)                    Contractures Contractures Info: Not present    Additional Factors Info  Code Status,Allergies Code Status Info: DNR Allergies Info: Morphine and related, vicodin           Current Medications (05/06/2020):  This is the current hospital active medication list Current Facility-Administered Medications  Medication Dose Route Frequency Provider Last Rate Last Admin  . acetaminophen (TYLENOL) tablet 650 mg  650 mg Oral Q6H PRN Lenore Cordia, MD       Or  . acetaminophen (TYLENOL) suppository 650 mg  650 mg Rectal Q6H PRN Zada Finders R, MD      . albuterol (VENTOLIN HFA) 108 (90 Base) MCG/ACT inhaler 2 puff  2 puff Inhalation Q6H PRN Zada Finders R, MD      . apixaban (ELIQUIS) tablet 5 mg  5 mg Oral BID Zada Finders R, MD   5 mg at 05/06/20 1030  . atorvastatin (LIPITOR) tablet 20 mg  20 mg Oral QHS Patel, Vishal R, MD      . furosemide (LASIX) injection 40 mg  40 mg Intravenous Q12H Mansy, Jan A, MD      . ipratropium-albuterol (DUONEB) 0.5-2.5 (3) MG/3ML nebulizer solution 3 mL  3 mL Nebulization BID Zada Finders R, MD   3 mL at 05/06/20 1111  . levothyroxine (SYNTHROID) tablet 125 mcg  125  mcg Oral QAC breakfast Lenore Cordia, MD   125 mcg at 05/06/20 1761  . ondansetron (ZOFRAN) tablet 4 mg  4 mg Oral Q6H PRN Lenore Cordia, MD       Or  . ondansetron (ZOFRAN) injection 4 mg  4 mg Intravenous Q6H PRN Zada Finders R, MD      . potassium chloride SA (KLOR-CON) CR tablet 20 mEq  20 mEq Oral Daily Zada Finders R, MD   20 mEq at 05/06/20 1030  . sodium chloride flush (NS) 0.9 % injection 3 mL  3 mL Intravenous Q12H Lenore Cordia, MD   3 mL at 05/06/20 1030     Discharge Medications: Please see discharge summary for a list of discharge medications.  Relevant Imaging Results:  Relevant Lab Results:   Additional Information ss [id number]  Beverly Sessions, RN

## 2020-05-06 NOTE — TOC Initial Note (Signed)
Transition of Care [facility]) - Initial/Assessment Note    Patient Details  Name: [name] MRN: [id number] Date of Birth: 01-Jan-1941  Transition [facility]) CM/SW Contact:    Beverly Sessions, RN Phone Number: 05/06/2020, 2:48 PM  Clinical Narrative:                  Patient admitted from home with cellulitis Patient lives at home with husband 2 adult sons live locally Patient open with Manufacturing engineer in the home  PT has recommended SNF.  Patient wishes to revoke her hospice services and go to SNF for rehab.  She request that I call her son Sharen Heck, and let him make the decisions.   Sharen Heck is in agreement.  Not interested in [place] sent for signature Bed search initiated  MD and Kieth Brightly from [employer] updated Expected Discharge Plan: [place] Barriers to Discharge: Continued Medical Work up   Patient Goals and CMS Choice        Expected Discharge Plan and Services Expected Discharge Plan: [place]   Discharge Planning Services: CM Consult   Living arrangements for the past 2 months: Single Family Home                                      Prior Living Arrangements/Services Living arrangements for the past 2 months: Single Family Home Lives with:: Spouse Patient language and need for interpreter reviewed:: Yes        Need for Family Participation in Patient Care: Yes (Comment) Care giver support system in place?: Yes (comment) Current home services: DME Criminal Activity/Legal Involvement Pertinent to Current Situation/Hospitalization: No - Comment as needed  Activities of Daily Living Home Assistive Devices/Equipment: Bedside commode/3-in-1,Walker (specify type) ADL Screening (condition at time of admission) Patient's cognitive ability adequate to safely complete daily activities?: Yes Is the patient deaf or have difficulty hearing?: No Does the patient have  difficulty seeing, even when wearing glasses/contacts?: No Does the patient have difficulty concentrating, remembering, or making decisions?: No Patient able to express need for assistance with ADLs?: Yes Does the patient have difficulty dressing or bathing?: Yes Independently performs ADLs?: No Communication: Independent Dressing (OT): Needs assistance Is this a change from baseline?: Pre-admission baseline Grooming: Needs assistance Is this a change from baseline?: Pre-admission baseline Feeding: Independent Bathing: Needs assistance Is this a change from baseline?: Pre-admission baseline Toileting: Needs assistance Is this a change from baseline?: Change from baseline, expected to last >3days In/Out Bed: Needs assistance Is this a change from baseline?: Change from baseline, expected to last >3 days Walks in Home: Needs assistance Is this a change from baseline?: Pre-admission baseline Does the patient have difficulty walking or climbing stairs?: Yes Weakness of Legs: Both Weakness of Arms/Hands: Both  Permission Sought/Granted                  Emotional Assessment       Orientation: : Oriented to Self,Oriented to Place,Oriented to  Time,Oriented to Situation Alcohol / Substance Use: Not Applicable Psych Involvement: No (comment)  Admission diagnosis:  Lower extremity edema [R60.0] Acute on chronic diastolic (congestive) heart failure (HCC) [I50.33] Acute on chronic congestive heart failure, unspecified heart failure type [facility]) [I50.9] Patient Active Problem List   Diagnosis Date Noted  . Acute on chronic diastolic (congestive) heart failure ([place]) 05/05/2020  . Atrial fibrillation ([place])   .  Acute CHF (congestive heart failure) ([place]) 08/22/2019  . Community acquired pneumonia 05/26/2016  . Pneumonia 05/26/2016  . Pressure injury of skin 05/26/2016  . Pressure ulcer 03/24/2015  . Cellulitis 03/23/2015  . Vaginal atrophy 08/19/2014  . Asthma, chronic 04/18/2012   . Obstructive sleep apnea 04/18/2012  . Essential hypertension, benign 04/18/2012  . Hypercholesterolemia 04/18/2012  . Critical aortic valve stenosis 04/18/2012  . Endometrial cancer ([place]) 04/18/2012  . Diverticulosis of colon without hemorrhage 04/18/2012  . Hypothyroidism 04/18/2012  . Overactive bladder 04/18/2012   PCP:  System, Provider Not In Pharmacy:   [id number] - [place], [address] [place] [address] Phone: [phone] Fax: [phone]     Social Determinants of Health (SDOH) Interventions    Readmission Risk Interventions No flowsheet data found.

## 2020-05-06 NOTE — ED Notes (Signed)
Late entry -- Pt purewick displaced; this nurse and two other staff members provided pericare, linen change and replacement with new purewick.  Barrier cream reapplied to skin tears to perineal region and to buttock region showing erythema.  (Note- pt reports burning/itching sensation to tear sites)

## 2020-05-06 NOTE — ED Notes (Signed)
Dr Argie Ramming notified of current bp 94/47 with MAP 47 - awaiting reply

## 2020-05-06 NOTE — Care Management Obs Status (Signed)
[place] NOTIFICATION   Patient Details  Name: [name] MRN: [id number] Date of Birth: 1940/12/28   Medicare Observation Status Notification Given:  Yes    Beverly Sessions, RN 05/06/2020, 2:44 PM

## 2020-05-06 NOTE — Progress Notes (Signed)
PT Cancellation Note  Patient Details Name: [name] MRN: [id number] DOB: 19-Jan-1941   Cancelled Treatment:    Reason Eval/Treat Not Completed: Other (comment). Patient just transferred to floor from ED. Will re-attempt later as appropriate.    Juliah Scadden 05/06/2020, 10:16 AM

## 2020-05-06 NOTE — Progress Notes (Signed)
PROGRESS NOTE    [name] [facility]  FUX:[id number] DOB: Mar 13, 1940 DOA: 05/05/2020 PCP: System, Provider Not In    Brief Narrative:  [name] is a 80 y.o. female with medical history significant for chronic diastolic CHF (EF 02-54%), severe aortic stenosis, chronic atrial fibrillation on Eliquis, asthma, hypertension, hyperlipidemia, hypothyroidism, depression/anxiety, who is currently active with hospice and presents to the ED for evaluation of progressive lower extremity edema. Patient states she has been having progressive lower extremity edema in both of her legs over the last several weeks, significantly worse than her baseline.  She has seen some redness of the skin at the lower part of both legs.  She has been having shortness of breath with minimal exertion as well as orthopnea. 2 view chest x-ray shows stable small pleural effusion and/or atelectasis at the left lung base, no acute focal consolidation, edema.  Bilateral lower extremity ultrasounds are negative for evidence of DVT.   3/3-PT rec. SNF.  covid negative   Consultants:     Procedures:   Antimicrobials:       Subjective: Still with LE edema, sob about the same. Lying in bed.  Objective: Vitals:   05/06/20 0300 05/06/20 0645 05/06/20 0800 05/06/20 1021  BP: (!) 93/57 109/68 101/74 125/73  Pulse: 88 96 76 80  Resp: 16 (!) 22 19 (!) 22  Temp:   97.8 F (36.6 C) 97.6 F (36.4 C)  TempSrc:   Oral Oral  SpO2: 92% 93% 95% 94%  Weight:    (!) 165.2 kg  Height:    5\' 5"  (1.651 m)    Intake/Output Summary (Last 24 hours) at 05/06/2020 1409 Last data filed at 05/06/2020 1401 Gross per 24 hour  Intake 120 ml  Output --  Net 120 ml   Filed Weights   05/06/20 1021  Weight: (!) 165.2 kg    Examination:  General exam: Appears calm and comfortable  Respiratory system: Clear to auscultation. Respiratory effort normal. Cardiovascular system: S1 & S2 heard, RRR. No JVD, murmurs, rubs, gallops or  clicks.  Gastrointestinal system: Abdomen is nondistended, soft and nontender. No organomegaly or masses felt. Normal bowel sounds heard. Central nervous system: Alert and oriented. No focal neurological deficits. Extremities: 4+ edema b/l up to over shins. Chronic skin changes , by shin warm to touch, mildly erythema appears to be chronic of LLE Skin: warm, dry Psychiatry: Judgement and insight appear normal. Mood & affect appropriate.     Data Reviewed: I have personally reviewed following labs and imaging studies  CBC: Recent Labs  Lab 05/05/20 1400 05/06/20 0406  WBC 10.3 9.3  HGB 13.2 12.5  HCT 39.5 37.9  MCV 89.6 89.4  PLT 262 270   Basic Metabolic Panel: Recent Labs  Lab 05/05/20 1400 05/06/20 1028  NA 135 136  K 3.9 4.1  CL 97* 98  CO2 28 27  GLUCOSE 118* 149*  BUN 12 12  CREATININE 0.80 0.91  CALCIUM 9.3 9.1  MG  --  1.9   GFR: Estimated Creatinine Clearance: 79.4 mL/min (by C-G formula based on SCr of 0.91 mg/dL). Liver Function Tests: No results for input(s): AST, ALT, ALKPHOS, BILITOT, PROT, ALBUMIN in the last 168 hours. No results for input(s): LIPASE, AMYLASE in the last 168 hours. No results for input(s): AMMONIA in the last 168 hours. Coagulation Profile: No results for input(s): INR, PROTIME in the last 168 hours. Cardiac Enzymes: No results for input(s): CKTOTAL, CKMB, CKMBINDEX, TROPONINI in the last 168 hours.  BNP (last 3 results) No results for input(s): PROBNP in the last 8760 hours. HbA1C: No results for input(s): HGBA1C in the last 72 hours. CBG: No results for input(s): GLUCAP in the last 168 hours. Lipid Profile: No results for input(s): CHOL, HDL, LDLCALC, TRIG, CHOLHDL, LDLDIRECT in the last 72 hours. Thyroid Function Tests: Recent Labs    05/06/20 1028  TSH 3.333   Anemia Panel: No results for input(s): VITAMINB12, FOLATE, FERRITIN, TIBC, IRON, RETICCTPCT in the last 72 hours. Sepsis Labs: Recent Labs  Lab 05/05/20 1406   LATICACIDVEN 1.4    Recent Results (from the past 240 hour(s))  SARS CORONAVIRUS 2 (TAT 6-24 HRS) Nasopharyngeal Nasopharyngeal Swab     Status: None   Collection Time: 05/05/20  6:07 PM   Specimen: Nasopharyngeal Swab  Result Value Ref Range Status   SARS Coronavirus 2 NEGATIVE NEGATIVE Final    Comment: (NOTE) SARS-CoV-2 target nucleic acids are NOT DETECTED.  The SARS-CoV-2 RNA is generally detectable in upper and lower respiratory specimens during the acute phase of infection. Negative results do not preclude SARS-CoV-2 infection, do not rule out co-infections with other pathogens, and should not be used as the sole basis for treatment or other patient management decisions. Negative results must be combined with clinical observations, patient history, and epidemiological information. The expected result is Negative.  Fact Sheet for Patients: SugarRoll.be  Fact Sheet for Healthcare Providers: https://www.woods-mathews.com/  This test is not yet approved or cleared by the [place] FDA and  has been authorized for detection and/or diagnosis of SARS-CoV-2 by FDA under an Emergency Use Authorization (EUA). This EUA will remain  in effect (meaning this test can be used) for the duration of the COVID-19 declaration under Se ction 564(b)(1) of the Act, 21 U.S.C. section 360bbb-3(b)(1), unless the authorization is terminated or revoked sooner.  Performed at [facility], [place] [address] [place], [place] [address]          Radiology Studies: DG Chest 2 View  Result Date: 05/05/2020 CLINICAL DATA:  Shortness of breath, redness and swelling to LEFT lower extremity. EXAM: CHEST - 2 VIEW COMPARISON:  Chest x-rays dated 08/22/2019 09/01/2016. FINDINGS: Heart size and mediastinal contours are stable. Small pleural effusion and/or atelectasis at the LEFT lung base, stable and likely chronic. No new lung findings. No pneumothorax.  No acute appearing osseous abnormality. IMPRESSION: No active cardiopulmonary disease. No evidence of pneumonia or pulmonary edema. Electronically Signed   By: Franki Cabot M.D.   On: 05/05/2020 18:29   US Venous Img Lower Bilateral  Result Date: 05/05/2020 CLINICAL DATA:  Shortness of breath, lower extremity pain and edema EXAM: BILATERAL LOWER EXTREMITY VENOUS DOPPLER ULTRASOUND TECHNIQUE: Gray-scale sonography with compression, as well as color and duplex ultrasound, were performed to evaluate the deep venous system(s) from the level of the common femoral vein through the popliteal and proximal calf veins. COMPARISON:  06/25/2014 FINDINGS: VENOUS Normal compressibility of the common femoral, superficial femoral, and popliteal veins, as well as the visualized calf veins. Visualized portions of profunda femoral vein and great saphenous vein unremarkable. No filling defects to suggest DVT on grayscale or color Doppler imaging. Doppler waveforms show normal direction of venous flow, normal respiratory plasticity and response to augmentation. OTHER None. Limitations: none IMPRESSION: 1. No evidence of deep venous thrombosis within either lower extremity. Electronically Signed   By: Randa Ngo M.D.   On: 05/05/2020 19:03        Scheduled Meds: . apixaban  5 mg Oral BID  . atorvastatin  20 mg Oral QHS  . furosemide  40 mg Intravenous Q12H  . ipratropium-albuterol  3 mL Nebulization BID  . levothyroxine  125 mcg Oral QAC breakfast  . potassium chloride SA  20 mEq Oral Daily  . sodium chloride flush  3 mL Intravenous Q12H   Continuous Infusions:  Assessment & Plan:   Principal Problem:   Acute on chronic diastolic (congestive) heart failure (HCC) Active Problems:   Asthma, chronic   Essential hypertension, benign   Hypercholesterolemia   Hypothyroidism   Atrial fibrillation ([place])  [name] is a 80 y.o. female with medical history significant for chronic diastolic CHF (EF  06-30%), severe aortic stenosis, chronic atrial fibrillation on Eliquis, asthma, hypertension, hyperlipidemia, hypothyroidism, depression/anxiety, who is admitted with acute on chronic diastolic CHF.  Acute on chronic diastolic CHF exacerbation: Progressive over several weeks with increased lower extremity edema, orthopnea, DOE. Last EF 60-65% by TTE 08/24/2019. -3/3-still with quite LE edema. Continue Lasix 40 mg IV twice daily  monitor renal function and electrolytes Strict I's and O's Daily weight   Chronic atrial fibrillation: Remains in atrial fibrillation with controlled rate. Patient states she stopped Eliquis several months ago due to financial issues. CHA2DS2-VASc score is at least 5. -3/3-Eliquis  TOC consult for medication needs   Asthma: Stable without acute exacerbation  Continue MDIs and duo nebs     Hypertension: Hold amlodipine as BP on low side   Hypothyroidism: Continue Synthroid.  Hyperlipidemia: Continue atorvastatin.  Severe aortic stenosis: Noted on TTE 08/24/2019.  Per documentation, at that time patient refused left and right heart cath and elected for palliative/hospice care.  Deconditioning/diminished mobility: PT OT recommend SNF  DVT prophylaxis: Eliquis Code Status: DNR Family Communication: Left voicemail for son   Status is: Observation  The patient remains OBS appropriate and will d/c before 2 midnights.  Dispo: The patient is from: Home              Anticipated d/c is to: SNF              Patient currently is not medically stable to d/c.   Difficult to place patient No            LOS: 0 days   Time spent: 35 min with >50% on coc    Nolberto Hanlon, MD [facility] Pager [phone]  If 7PM-7AM, please contact night-coverage 05/06/2020, 2:09 PM

## 2020-05-06 NOTE — Plan of Care (Signed)
Oriented to unit, discussed plan of care

## 2020-05-06 NOTE — ED Notes (Signed)
Per Dr Argie Ramming hold Lasix if SBP <95 and give 241ml NS bolus if pt symptomatic with dizziness

## 2020-05-07 DIAGNOSIS — L03116 Cellulitis of left lower limb: Secondary | ICD-10-CM | POA: Diagnosis present

## 2020-05-07 DIAGNOSIS — Z7901 Long term (current) use of anticoagulants: Secondary | ICD-10-CM | POA: Diagnosis not present

## 2020-05-07 DIAGNOSIS — G4730 Sleep apnea, unspecified: Secondary | ICD-10-CM | POA: Diagnosis present

## 2020-05-07 DIAGNOSIS — Z7982 Long term (current) use of aspirin: Secondary | ICD-10-CM | POA: Diagnosis not present

## 2020-05-07 DIAGNOSIS — Z79899 Other long term (current) drug therapy: Secondary | ICD-10-CM | POA: Diagnosis not present

## 2020-05-07 DIAGNOSIS — E7800 Pure hypercholesterolemia, unspecified: Secondary | ICD-10-CM | POA: Diagnosis present

## 2020-05-07 DIAGNOSIS — K219 Gastro-esophageal reflux disease without esophagitis: Secondary | ICD-10-CM | POA: Diagnosis present

## 2020-05-07 DIAGNOSIS — K5900 Constipation, unspecified: Secondary | ICD-10-CM | POA: Diagnosis present

## 2020-05-07 DIAGNOSIS — Z20822 Contact with and (suspected) exposure to covid-19: Secondary | ICD-10-CM | POA: Diagnosis present

## 2020-05-07 DIAGNOSIS — Z79890 Hormone replacement therapy (postmenopausal): Secondary | ICD-10-CM | POA: Diagnosis not present

## 2020-05-07 DIAGNOSIS — Z515 Encounter for palliative care: Secondary | ICD-10-CM | POA: Diagnosis not present

## 2020-05-07 DIAGNOSIS — J449 Chronic obstructive pulmonary disease, unspecified: Secondary | ICD-10-CM | POA: Diagnosis present

## 2020-05-07 DIAGNOSIS — I350 Nonrheumatic aortic (valve) stenosis: Secondary | ICD-10-CM | POA: Diagnosis present

## 2020-05-07 DIAGNOSIS — Z885 Allergy status to narcotic agent status: Secondary | ICD-10-CM | POA: Diagnosis not present

## 2020-05-07 DIAGNOSIS — Z7902 Long term (current) use of antithrombotics/antiplatelets: Secondary | ICD-10-CM | POA: Diagnosis not present

## 2020-05-07 DIAGNOSIS — Z66 Do not resuscitate: Secondary | ICD-10-CM | POA: Diagnosis present

## 2020-05-07 DIAGNOSIS — F419 Anxiety disorder, unspecified: Secondary | ICD-10-CM | POA: Diagnosis present

## 2020-05-07 DIAGNOSIS — I5033 Acute on chronic diastolic (congestive) heart failure: Secondary | ICD-10-CM | POA: Diagnosis present

## 2020-05-07 DIAGNOSIS — I482 Chronic atrial fibrillation, unspecified: Secondary | ICD-10-CM | POA: Diagnosis present

## 2020-05-07 DIAGNOSIS — I110 Hypertensive heart disease with heart failure: Secondary | ICD-10-CM | POA: Diagnosis present

## 2020-05-07 DIAGNOSIS — E039 Hypothyroidism, unspecified: Secondary | ICD-10-CM | POA: Diagnosis present

## 2020-05-07 DIAGNOSIS — Z599 Problem related to housing and economic circumstances, unspecified: Secondary | ICD-10-CM | POA: Diagnosis not present

## 2020-05-07 DIAGNOSIS — R600 Localized edema: Secondary | ICD-10-CM | POA: Diagnosis present

## 2020-05-07 DIAGNOSIS — F32A Depression, unspecified: Secondary | ICD-10-CM | POA: Diagnosis present

## 2020-05-07 LAB — BASIC METABOLIC PANEL
Anion gap: 9 (ref 5–15)
BUN: 12 mg/dL (ref 8–23)
CO2: 28 mmol/L (ref 22–32)
Calcium: 8.6 mg/dL — ABNORMAL LOW (ref 8.9–10.3)
Chloride: 101 mmol/L (ref 98–111)
Creatinine, Ser: 0.88 mg/dL (ref 0.44–1.00)
GFR, Estimated: >60 mL/min (ref >60)
Glucose, Bld: 103 mg/dL — ABNORMAL HIGH (ref 70–99)
Potassium: 3.7 mmol/L (ref 3.5–5.1)
Sodium: 138 mmol/L (ref 135–145)

## 2020-05-07 MED ORDER — IPRATROPIUM-ALBUTEROL 0.5-2.5 (3) MG/3ML IN SOLN
3.0000 mL | Freq: Two times a day (BID) | RESPIRATORY_TRACT | Status: DC
  Administered 2020-05-07 – 2020-05-10 (×6): 3 mL via RESPIRATORY_TRACT
  Filled 2020-05-07 (×7): qty 3

## 2020-05-07 MED ORDER — SENNA 8.6 MG PO TABS
1.0000 | ORAL_TABLET | Freq: Every day | ORAL | Status: DC
  Administered 2020-05-07 – 2020-05-09 (×3): 8.6 mg via ORAL
  Filled 2020-05-07 (×3): qty 1

## 2020-05-07 NOTE — Progress Notes (Signed)
Physical Therapy Treatment Patient Details Name: [name] [facility] MRN: [id number] DOB: 11/08/1940 Today's Date: 05/07/2020    History of Present Illness [name] is a 80 y.o. female with medical history significant for chronic diastolic CHF (EF 87-86%), severe aortic stenosis, chronic atrial fibrillation on Eliquis, asthma, hypertension, hyperlipidemia, hypothyroidism, depression/anxiety, who is currently active with hospice and presents to the ED for evaluation of progressive lower extremity edema.    PT Comments    Patient received in bed, second attempt to see patient. She is agreeable to bed exercises at this time. States she has been approved to go to [facility] for rehab. Patient is mostly independent with minimal LE bed exercises. Requires use of UEs to assist with LEs in moving into knee flexion. Patient will continue to benefit from skilled PT while here to improve strength and independence.        Follow Up Recommendations  SNF     Equipment Recommendations  Other (comment);None recommended by PT    Recommendations for Other Services       Precautions / Restrictions Precautions Precautions: Fall Restrictions Weight Bearing Restrictions: No    Mobility  Bed Mobility               General bed mobility comments: Patient only willing to do bed exercises this pm. Will need +2-3 for supine to sit attempt.    Transfers                    Ambulation/Gait                 Stairs             Wheelchair Mobility    Modified Rankin (Stroke Patients Only)       Balance                                            Cognition Arousal/Alertness: Awake/alert Behavior During Therapy: WFL for tasks assessed/performed Overall Cognitive Status: Within Functional Limits for tasks assessed                                        Exercises Other Exercises Other Exercises: B LE exercises: ap, heel slides, hip  abd/add, SLR x 5 reps each. Patient requires UE assist to perform heel slides. Fatigued with 5 reps of each.    General Comments        Pertinent Vitals/Pain Pain Assessment: No/denies pain    Home Living                      Prior Function            PT Goals (current goals can now be found in the care plan section) Acute Rehab PT Goals Patient Stated Goal: get stronger and be able to transfer again PT Goal Formulation: With patient Time For Goal Achievement: 05/20/20 Potential to Achieve Goals: Fair Progress towards PT goals: Progressing toward goals    Frequency    Min 2X/week      PT Plan Current plan remains appropriate    Co-evaluation              AM-PAC PT "6 Clicks" Mobility   Outcome Measure  Help needed turning from your back  to your side while in a flat bed without using bedrails?: Total Help needed moving from lying on your back to sitting on the side of a flat bed without using bedrails?: Total Help needed moving to and from a bed to a chair (including a wheelchair)?: Total Help needed standing up from a chair using your arms (e.g., wheelchair or bedside chair)?: Total Help needed to walk in hospital room?: Total Help needed climbing 3-5 steps with a railing? : Total 6 Click Score: 6    End of Session   Activity Tolerance: Patient limited by fatigue Patient left: in bed;with call bell/phone within reach Nurse Communication: Mobility status PT Visit Diagnosis: Other abnormalities of gait and mobility (R26.89);Muscle weakness (generalized) (M62.81)     Time: 0347-4259 PT Time Calculation (min) (ACUTE ONLY): 9 min  Charges:  $Therapeutic Exercise: 8-22 mins                     Toshiyuki Fredell, PT, GCS 05/07/20,3:54 PM

## 2020-05-07 NOTE — TOC Progression Note (Addendum)
Transition of Care [facility]) - Progression Note    Patient Details  Name: Shauntay Brunelli MRN: [id number] Date of Birth: [birth year]  Transition of Care [facility]) CM/SW [place], LCSW Phone Number: 05/07/2020, 12:01 PM  Clinical Narrative: [facility] is only bed offer at this time. Admissions coordinator confirming they can take her with weight and level of assistance needed.    2:38 pm: [facility] can offer a bed. Patient and son Sharen Heck is aware and agreeable. [facility] will not have a bed until Monday. She reports she has not received her COVID vaccines. Explained that she would be in 14-day quarantine.   4:12 pm: Uploaded clinicals into [facility] portal to start insurance authorization.  Expected Discharge Plan: [place] Barriers to Discharge: Continued Medical Work up  Expected Discharge Plan and Services Expected Discharge Plan: [place]   Discharge Planning Services: CM Consult   Living arrangements for the past 2 months: Single Family Home                                       Social Determinants of Health (SDOH) Interventions    Readmission Risk Interventions No flowsheet data found.

## 2020-05-07 NOTE — Progress Notes (Signed)
PROGRESS NOTE    [name] [facility]  ONG:[id number] DOB: 1940-11-12 DOA: 05/05/2020 PCP: System, Provider Not In    Brief Narrative:  [name] is a 80 y.o. female with medical history significant for chronic diastolic CHF (EF 44-01%), severe aortic stenosis, chronic atrial fibrillation on Eliquis, asthma, hypertension, hyperlipidemia, hypothyroidism, depression/anxiety, who is currently active with hospice and presents to the ED for evaluation of progressive lower extremity edema. Patient states she has been having progressive lower extremity edema in both of her legs over the last several weeks, significantly worse than her baseline.  She has seen some redness of the skin at the lower part of both legs.  She has been having shortness of breath with minimal exertion as well as orthopnea. 2 view chest x-ray shows stable small pleural effusion and/or atelectasis at the left lung base, no acute focal consolidation, edema.  Bilateral lower extremity ultrasounds are negative for evidence of DVT.   3/3-PT rec. SNF.  covid negative  3/4 no overnight issues. SNF bed ready on monday  Consultants:     Procedures:   Antimicrobials:       Subjective: Feeling better. Less sob. Slept better  Objective: Vitals:   05/06/20 2347 05/07/20 0500 05/07/20 0852 05/07/20 1143  BP: (!) 100/58 121/79 99/70 106/62  Pulse: 82 84  97  Resp: 18 16  20   Temp: 98.5 F (36.9 C) 98.9 F (37.2 C)  97.7 F (36.5 C)  TempSrc: Oral Oral    SpO2: 96% 94%  96%  Weight:      Height:        Intake/Output Summary (Last 24 hours) at 05/07/2020 1451 Last data filed at 05/07/2020 1421 Gross per 24 hour  Intake 243 ml  Output 1700 ml  Net -1457 ml   Filed Weights   05/06/20 1021  Weight: (!) 165.2 kg    Examination:  Nad, calm Scattered fine crackles at bases Regular s1/s2 no gallop  soft benign, +bs Decrease LE edema  Aaxox3, grossly intact   Data Reviewed: I have personally  reviewed following labs and imaging studies  CBC: Recent Labs  Lab 05/05/20 1400 05/06/20 0406  WBC 10.3 9.3  HGB 13.2 12.5  HCT 39.5 37.9  MCV 89.6 89.4  PLT 262 027   Basic Metabolic Panel: Recent Labs  Lab 05/05/20 1400 05/06/20 1028 05/07/20 0536  NA 135 136 138  K 3.9 4.1 3.7  CL 97* 98 101  CO2 28 27 28   GLUCOSE 118* 149* 103*  BUN 12 12 12   CREATININE 0.80 0.91 0.88  CALCIUM 9.3 9.1 8.6*  MG  --  1.9  --    GFR: Estimated Creatinine Clearance: 82.1 mL/min (by C-G formula based on SCr of 0.88 mg/dL). Liver Function Tests: No results for input(s): AST, ALT, ALKPHOS, BILITOT, PROT, ALBUMIN in the last 168 hours. No results for input(s): LIPASE, AMYLASE in the last 168 hours. No results for input(s): AMMONIA in the last 168 hours. Coagulation Profile: No results for input(s): INR, PROTIME in the last 168 hours. Cardiac Enzymes: No results for input(s): CKTOTAL, CKMB, CKMBINDEX, TROPONINI in the last 168 hours. BNP (last 3 results) No results for input(s): PROBNP in the last 8760 hours. HbA1C: No results for input(s): HGBA1C in the last 72 hours. CBG: No results for input(s): GLUCAP in the last 168 hours. Lipid Profile: No results for input(s): CHOL, HDL, LDLCALC, TRIG, CHOLHDL, LDLDIRECT in the last 72 hours. Thyroid Function Tests: Recent Labs  05/06/20 1028  TSH 3.333   Anemia Panel: No results for input(s): VITAMINB12, FOLATE, FERRITIN, TIBC, IRON, RETICCTPCT in the last 72 hours. Sepsis Labs: Recent Labs  Lab 05/05/20 1406  LATICACIDVEN 1.4    Recent Results (from the past 240 hour(s))  SARS CORONAVIRUS 2 (TAT 6-24 HRS) Nasopharyngeal Nasopharyngeal Swab     Status: None   Collection Time: 05/05/20  6:07 PM   Specimen: Nasopharyngeal Swab  Result Value Ref [place] Status   SARS Coronavirus 2 NEGATIVE NEGATIVE Final    Comment: (NOTE) SARS-CoV-2 target nucleic acids are NOT DETECTED.  The SARS-CoV-2 RNA is generally detectable in upper and  lower respiratory specimens during the acute phase of infection. Negative results do not preclude SARS-CoV-2 infection, do not rule out co-infections with other pathogens, and should not be used as the sole basis for treatment or other patient management decisions. Negative results must be combined with clinical observations, patient history, and epidemiological information. The expected result is Negative.  Fact Sheet for Patients: SugarRoll.be  Fact Sheet for Healthcare Providers: https://www.woods-mathews.com/  This test is not yet approved or cleared by the [place] FDA and  has been authorized for detection and/or diagnosis of SARS-CoV-2 by FDA under an Emergency Use Authorization (EUA). This EUA will remain  in effect (meaning this test can be used) for the duration of the COVID-19 declaration under Se ction 564(b)(1) of the Act, 21 U.S.C. section 360bbb-3(b)(1), unless the authorization is terminated or revoked sooner.  Performed at [facility], [place] [address] [place], [place] [address]          Radiology Studies: DG Chest 2 View  Result Date: 05/05/2020 CLINICAL DATA:  Shortness of breath, redness and swelling to LEFT lower extremity. EXAM: CHEST - 2 VIEW COMPARISON:  Chest x-rays dated 08/22/2019 09/01/2016. FINDINGS: Heart size and mediastinal contours are stable. Small pleural effusion and/or atelectasis at the LEFT lung base, stable and likely chronic. No new lung findings. No pneumothorax. No acute appearing osseous abnormality. IMPRESSION: No active cardiopulmonary disease. No evidence of pneumonia or pulmonary edema. Electronically Signed   By: Franki Cabot M.D.   On: 05/05/2020 18:29   US Venous Img Lower Bilateral  Result Date: 05/05/2020 CLINICAL DATA:  Shortness of breath, lower extremity pain and edema EXAM: BILATERAL LOWER EXTREMITY VENOUS DOPPLER ULTRASOUND TECHNIQUE: Gray-scale sonography with  compression, as well as color and duplex ultrasound, were performed to evaluate the deep venous system(s) from the level of the common femoral vein through the popliteal and proximal calf veins. COMPARISON:  06/25/2014 FINDINGS: VENOUS Normal compressibility of the common femoral, superficial femoral, and popliteal veins, as well as the visualized calf veins. Visualized portions of profunda femoral vein and great saphenous vein unremarkable. No filling defects to suggest DVT on grayscale or color Doppler imaging. Doppler waveforms show normal direction of venous flow, normal respiratory plasticity and response to augmentation. OTHER None. Limitations: none IMPRESSION: 1. No evidence of deep venous thrombosis within either lower extremity. Electronically Signed   By: Randa Ngo M.D.   On: 05/05/2020 19:03        Scheduled Meds: . apixaban  5 mg Oral BID  . atorvastatin  20 mg Oral QHS  . furosemide  40 mg Intravenous Q12H  . ipratropium-albuterol  3 mL Nebulization BID  . levothyroxine  125 mcg Oral QAC breakfast  . nystatin   Topical TID  . potassium chloride SA  20 mEq Oral Daily  . sodium chloride flush  3  mL Intravenous Q12H   Continuous Infusions:  Assessment & Plan:   Principal Problem:   Acute on chronic diastolic (congestive) heart failure (HCC) Active Problems:   Asthma, chronic   Essential hypertension, benign   Hypercholesterolemia   Hypothyroidism   Atrial fibrillation ([place])  Haven Pylant is a 80 y.o. female with medical history significant for chronic diastolic CHF (EF 05-11%), severe aortic stenosis, chronic atrial fibrillation on Eliquis, asthma, hypertension, hyperlipidemia, hypothyroidism, depression/anxiety, who is admitted with acute on chronic diastolic CHF.  Acute on chronic diastolic CHF exacerbation: Progressive over several weeks with increased lower extremity edema, orthopnea, DOE. Last EF 60-65% by TTE 08/24/2019. -3/4-improving . Will continue  with iv lasix  Monitor renal function and electrolytes  Check I's and O's and daily weight    Chronic atrial fibrillation: Remains in atrial fibrillation with controlled rate. Patient states she stopped Eliquis several months ago due to financial issues. CHA2DS2-VASc score is at least 5. 3/4-continue with Eliquis TOC consult for med needs    Asthma: Stable without acute exacerbation  Continue MDIs and DuoNeb      Hypertension: On low side with Lasix, will hold amlodipine   Hypothyroidism: Continue Synthroid.  Hyperlipidemia: Continue atorvastatin.  Severe aortic stenosis: Noted on TTE 08/24/2019.  Per documentation, at that time patient refused left and right heart cath and elected for palliative/hospice care.  Deconditioning/diminished mobility: PT OT recommend SNF  DVT prophylaxis: Eliquis Code Status: DNR Family Communication: none at bedside  Status is: Observation  The patient remains OBS appropriate and will d/c before 2 midnights.  Dispo: The patient is from: Home              Anticipated d/c is to: SNF on monday              Patient currently is not medically stable to d/c.   Difficult to place patient No            LOS: 0 days   Time spent: 35 min with >50% on coc    Nolberto Hanlon, MD [facility] Pager [phone]  If 7PM-7AM, please contact night-coverage 05/07/2020, 2:51 PM

## 2020-05-07 NOTE — Plan of Care (Signed)
Continuing with plan of care. 

## 2020-05-07 NOTE — Progress Notes (Signed)
[facility] 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 She activated EMS 03.02 after contacting her hospice RN for complaints of lower extremity edema and redness for several weeks and shortness of breath with exertion more than usual. She was admitted to [facility] on 03.02 at 1942 with at diagnosis of CHF exacerbation. Per Dr. Gilford Rile with [facility], this is a related admission. Patient is a DNR.  Visited with patient at bedside. She is resting in bed and in good spirits. States she is feeling much better and has finally gotten some sleep. Her feet and legs are less edematous and red. Discussion had about discharge plans. PT has recommended rehab for patient and she agreeable. Discussed revocation of hospice medicare benefits in order to pursue rehab. She verbalized understanding and agrees that she wants to choose to pursue rehab and then have hospice back afterwards. Revocation paperwork has been started. Hospital CM is doing a bedsearch for SNF's. [facility] Liaison will continue to follow for disposition. Called and spoke with son, Lowel over the phone to provide update and he is in agreement with plan.  Vital Signs: BP 106/62, HR 97, Resp 20, Temp 97.7, O2 sats 96% on RA  I&O: 268ml/700ml  Abnormal Labs: Glucose: 103 (H) Calcium: 8.6 (L)  Diagnostics: none new  IV/PRN Meds: furosemide (LASIX) injection 40 mg Dose: 40 mg Freq: Every 12 hours Route: IV Start: 05/06/20 0500  Problem List: Acute on chronic diastolic CHF exacerbation: Progressive over several weeks with increased lower extremity edema, orthopnea, DOE. Last EF 60-65% by TTE 08/24/2019. -3/3-still with quite LE edema. Continue Lasix 40 mg IV twice daily  monitor renal function and electrolytes Strict I's and O's Daily weight  Chronic  atrial fibrillation: Remains in atrial fibrillation with controlled rate. Patient states she stopped Eliquis several months ago due to financial issues. CHA2DS2-VASc score is at least 5. -3/3-Eliquis  TOC consult for medication needs   Asthma: Stable without acute exacerbation  Continue MDIs and duo nebs   Hypertension: Hold amlodipine as BP on low side   Hypothyroidism: Continue Synthroid.  Hyperlipidemia: Continue atorvastatin.  Severe aortic stenosis: Noted on TTE 08/24/2019. Per documentation, at that time patient refused left and right heart cath and elected for palliative/hospice care.  Deconditioning/diminished mobility: PT OT recommend SNF  DVT prophylaxis: Eliquis  Discharge Planning: Plan is for patient to go to [facility] for Rehab Monday.  IDG: Updated  Goals of Care: clear   Patient remains GIP for IV management of fluid overload.  Please call with any hospice related questions or concerns.  Zandra Abts, RN [facility] Liaison [phone]

## 2020-05-08 LAB — BASIC METABOLIC PANEL
Anion gap: 6 (ref 5–15)
BUN: 12 mg/dL (ref 8–23)
CO2: 30 mmol/L (ref 22–32)
Calcium: 8.5 mg/dL — ABNORMAL LOW (ref 8.9–10.3)
Chloride: 101 mmol/L (ref 98–111)
Creatinine, Ser: 0.82 mg/dL (ref 0.44–1.00)
GFR, Estimated: >60 mL/min (ref >60)
Glucose, Bld: 111 mg/dL — ABNORMAL HIGH (ref 70–99)
Potassium: 3.4 mmol/L — ABNORMAL LOW (ref 3.5–5.1)
Sodium: 137 mmol/L (ref 135–145)

## 2020-05-08 LAB — BRAIN NATRIURETIC PEPTIDE: B Natriuretic Peptide: 551.3 pg/mL — ABNORMAL HIGH (ref 0.0–100.0)

## 2020-05-08 MED ORDER — POLYETHYLENE GLYCOL 3350 17 G PO PACK
17.0000 g | PACK | Freq: Every day | ORAL | Status: DC
  Administered 2020-05-08 – 2020-05-09 (×2): 17 g via ORAL
  Filled 2020-05-08 (×2): qty 1

## 2020-05-08 NOTE — Plan of Care (Signed)
Continuing with plan of care. 

## 2020-05-08 NOTE — Progress Notes (Addendum)
PROGRESS NOTE    [name]  DGU:[id number] DOB: 08-26-1940 DOA: 05/05/2020 PCP: System, Provider Not In    Brief Narrative:  [name] is a 80 y.o. female with medical history significant for chronic diastolic CHF (EF 95-63%), severe aortic stenosis, chronic atrial fibrillation on Eliquis, asthma, hypertension, hyperlipidemia, hypothyroidism, depression/anxiety, who is currently active with hospice and presents to the ED for evaluation of progressive lower extremity edema. Patient states she has been having progressive lower extremity edema in both of her legs over the last several weeks, significantly worse than her baseline.  She has seen some redness of the skin at the lower part of both legs.  She has been having shortness of breath with minimal exertion as well as orthopnea. 2 view chest x-ray shows stable small pleural effusion and/or atelectasis at the left lung base, no acute focal consolidation, edema.  Bilateral lower extremity ultrasounds are negative for evidence of DVT.   3/3-PT rec. SNF.  covid negative  3/4 no overnight issues. SNF bed ready on monday 3/5- denies wosening sob, feels LE edema improving.discussed with pt avoiding salty foods.  Consultants:     Procedures:   Antimicrobials:       Subjective: Didn't sleep last night but breathing close to baseline   Objective: Vitals:   05/08/20 0427 05/08/20 0500 05/08/20 0742 05/08/20 0753  BP: 114/61   103/64  Pulse: 79   77  Resp: 18   17  Temp: 97.7 F (36.5 C)   98.7 F (37.1 C)  TempSrc:    Oral  SpO2: 97%  96% 96%  Weight:  (!) 162.9 kg    Height:        Intake/Output Summary (Last 24 hours) at 05/08/2020 8756 Last data filed at 05/07/2020 2042 Gross per 24 hour  Intake 243 ml  Output 3100 ml  Net -2857 ml   Filed Weights   05/06/20 1021 05/07/20 2044 05/08/20 0500  Weight: (!) 165.2 kg (!) 162.4 kg (!) 162.9 kg    Examination:  Calm, comfortable More cta no  wheezing Regular s1/s2 no gallop Soft benign +bs +edema, chronic, improved . LLE inner medial shin with erythema and warmth Aaxox3, grossly intact   Data Reviewed: I have personally reviewed following labs and imaging studies  CBC: Recent Labs  Lab 05/05/20 1400 05/06/20 0406  WBC 10.3 9.3  HGB 13.2 12.5  HCT 39.5 37.9  MCV 89.6 89.4  PLT 262 433   Basic Metabolic Panel: Recent Labs  Lab 05/05/20 1400 05/06/20 1028 05/07/20 0536 05/08/20 0459  NA 135 136 138 137  K 3.9 4.1 3.7 3.4*  CL 97* 98 101 101  CO2 28 27 28 30   GLUCOSE 118* 149* 103* 111*  BUN 12 12 12 12   CREATININE 0.80 0.91 0.88 0.82  CALCIUM 9.3 9.1 8.6* 8.5*  MG  --  1.9  --   --    GFR: Estimated Creatinine Clearance: 87.3 mL/min (by C-G formula based on SCr of 0.82 mg/dL). Liver Function Tests: No results for input(s): AST, ALT, ALKPHOS, BILITOT, PROT, ALBUMIN in the last 168 hours. No results for input(s): LIPASE, AMYLASE in the last 168 hours. No results for input(s): AMMONIA in the last 168 hours. Coagulation Profile: No results for input(s): INR, PROTIME in the last 168 hours. Cardiac Enzymes: No results for input(s): CKTOTAL, CKMB, CKMBINDEX, TROPONINI in the last 168 hours. BNP (last 3 results) No results for input(s): PROBNP in the last 8760 hours. HbA1C: No results  for input(s): HGBA1C in the last 72 hours. CBG: No results for input(s): GLUCAP in the last 168 hours. Lipid Profile: No results for input(s): CHOL, HDL, LDLCALC, TRIG, CHOLHDL, LDLDIRECT in the last 72 hours. Thyroid Function Tests: Recent Labs    05/06/20 1028  TSH 3.333   Anemia Panel: No results for input(s): VITAMINB12, FOLATE, FERRITIN, TIBC, IRON, RETICCTPCT in the last 72 hours. Sepsis Labs: Recent Labs  Lab 05/05/20 1406  LATICACIDVEN 1.4    Recent Results (from the past 240 hour(s))  SARS CORONAVIRUS 2 (TAT 6-24 HRS) Nasopharyngeal Nasopharyngeal Swab     Status: None   Collection Time: 05/05/20  6:07  PM   Specimen: Nasopharyngeal Swab  Result Value Ref [place] Status   SARS Coronavirus 2 NEGATIVE NEGATIVE Final    Comment: (NOTE) SARS-CoV-2 target nucleic acids are NOT DETECTED.  The SARS-CoV-2 RNA is generally detectable in upper and lower respiratory specimens during the acute phase of infection. Negative results do not preclude SARS-CoV-2 infection, do not rule out co-infections with other pathogens, and should not be used as the sole basis for treatment or other patient management decisions. Negative results must be combined with clinical observations, patient history, and epidemiological information. The expected result is Negative.  Fact Sheet for Patients: SugarRoll.be  Fact Sheet for Healthcare Providers: https://www.woods-mathews.com/  This test is not yet approved or cleared by the [place] FDA and  has been authorized for detection and/or diagnosis of SARS-CoV-2 by FDA under an Emergency Use Authorization (EUA). This EUA will remain  in effect (meaning this test can be used) for the duration of the COVID-19 declaration under Se ction 564(b)(1) of the Act, 21 U.S.C. section 360bbb-3(b)(1), unless the authorization is terminated or revoked sooner.  Performed at [facility], [place] [address], [place], [place] [address]          Radiology Studies: No results found.      Scheduled Meds: . apixaban  5 mg Oral BID  . atorvastatin  20 mg Oral QHS  . furosemide  40 mg Intravenous Q12H  . ipratropium-albuterol  3 mL Nebulization BID  . levothyroxine  125 mcg Oral QAC breakfast  . nystatin   Topical TID  . potassium chloride SA  20 mEq Oral Daily  . senna  1 tablet Oral Daily  . sodium chloride flush  3 mL Intravenous Q12H   Continuous Infusions:  Assessment & Plan:   Principal Problem:   Acute on chronic diastolic (congestive) heart failure (HCC) Active Problems:   Asthma, chronic   Essential  hypertension, benign   Hypercholesterolemia   Hypothyroidism   Atrial fibrillation ([place])  [name] is a 80 y.o. female with medical history significant for chronic diastolic CHF (EF 44-31%), severe aortic stenosis, chronic atrial fibrillation on Eliquis, asthma, hypertension, hyperlipidemia, hypothyroidism, depression/anxiety, who is admitted with acute on chronic diastolic CHF.  Acute on chronic diastolic CHF exacerbation: Progressive over several weeks with increased lower extremity edema, orthopnea, DOE. Last EF 60-65% by TTE 08/24/2019. 3/5-bnp elevated Clinically improving, but still volume overloaded Instructed her to avoid salty foods Continue Lasix IV 40 twice daily Check I's and O's Daily weight Monitor renal function and electrolytes    Chronic atrial fibrillation: Remains in atrial fibrillation with controlled rate. Patient states she stopped Eliquis several months ago due to financial issues. CHA2DS2-VASc score is at least 5. 3/5 continue with Eliquis  TOC consult for med needs      Asthma: Stable without acute exacerbation  Continue MDI and DuoNeb's    Hypertension: On low side with Lasix, will hold amlodipine for now  Continue to monitor      Hypothyroidism: Continue Synthroid.  Hyperlipidemia: Continue atorvastatin.  Severe aortic stenosis: Noted on TTE 08/24/2019.  Per documentation, at that time patient refused left and right heart cath and elected for palliative/hospice care. Patient reports that she was told she is not a candidate for intervention  Deconditioning/diminished mobility: PT OT recommend SNF  DVT prophylaxis: Eliquis Code Status: DNR Family Communication: left vm for son  Status is: inpatient  Patient remains inpatient due to: Unsafe discharge , and requiring iv lasix  Dispo: The patient is from: Home              Anticipated d/c is to: SNF on monday              Patient currently is not medically stable to  d/c.still with iv lasix    Difficult to place patient No            LOS: 1 day   Time spent: 35 min with >50% on coc    Nolberto Hanlon, MD [facility] Pager [phone]  If 7PM-7AM, please contact night-coverage 05/08/2020, 8:32 AM

## 2020-05-08 NOTE — Progress Notes (Signed)
Mobility Specialist - Progress Note   05/08/20 1200  Mobility  Range of Motion/Exercises Right leg;Left leg (AP, SLR, ABD, GS, ISO)  Level of Assistance Modified independent, requires aide device or extra time  Assistive Device None  Distance Ambulated (ft) 0 ft  Mobility Response Tolerated well  Mobility performed by Mobility specialist  $Mobility charge 1 Mobility    Pre-mobility: 83 HR, 94% SpO2 Post-mobility: 81 HR, 94% SpO2   Pt was lying in bed upon arrival utilizing room air. Pt agreed to session. Pt denied pain and nausea, but does report fatigue. Pt c/o not being able to have a BM in 4 days. Pt participated in bed-level therex for strengthening: ankle pumps x10, straight leg raises x10, abduction x10, and hip isometrics x8. While performing hip abductions, pt c/o R knee pain and describes it as "it feels like it's being pulled", pt states that this is something that has been going on awhile PTA. Pt declined participation in gluteal sets d/t wounds on buttocks making it painful and difficult. HR ranging between 77-96 bpm during session. Overall, pt tolerated session well. Pt was left in bed with all needs in reach and alarm set.    Kathee Delton Mobility Specialist 05/08/20, 12:54 PM

## 2020-05-09 LAB — BASIC METABOLIC PANEL
Anion gap: 7 (ref 5–15)
BUN: 12 mg/dL (ref 8–23)
CO2: 28 mmol/L (ref 22–32)
Calcium: 8.8 mg/dL — ABNORMAL LOW (ref 8.9–10.3)
Chloride: 102 mmol/L (ref 98–111)
Creatinine, Ser: 0.72 mg/dL (ref 0.44–1.00)
GFR, Estimated: >60 mL/min (ref >60)
Glucose, Bld: 102 mg/dL — ABNORMAL HIGH (ref 70–99)
Potassium: 3.7 mmol/L (ref 3.5–5.1)
Sodium: 137 mmol/L (ref 135–145)

## 2020-05-09 LAB — BRAIN NATRIURETIC PEPTIDE: B Natriuretic Peptide: 577.3 pg/mL — ABNORMAL HIGH (ref 0.0–100.0)

## 2020-05-09 MED ORDER — FLEET ENEMA 7-19 GM/118ML RE ENEM
1.0000 | ENEMA | Freq: Once | RECTAL | Status: AC
  Administered 2020-05-09: 1 via RECTAL

## 2020-05-09 MED ORDER — SENNA 8.6 MG PO TABS
1.0000 | ORAL_TABLET | Freq: Once | ORAL | Status: AC
  Administered 2020-05-09: 8.6 mg via ORAL
  Filled 2020-05-09: qty 1

## 2020-05-09 MED ORDER — RISAQUAD PO CAPS
1.0000 | ORAL_CAPSULE | Freq: Every day | ORAL | Status: DC
  Administered 2020-05-09 – 2020-05-10 (×2): 1 via ORAL
  Filled 2020-05-09 (×2): qty 1

## 2020-05-09 MED ORDER — FUROSEMIDE 20 MG PO TABS: 20.0000 mg | ORAL_TABLET | Freq: Every day | ORAL | Status: DC

## 2020-05-09 MED ORDER — SENNA 8.6 MG PO TABS: 2.0000 | ORAL_TABLET | Freq: Every day | ORAL | Status: DC

## 2020-05-09 MED ORDER — CEPHALEXIN 500 MG PO CAPS
500.0000 mg | ORAL_CAPSULE | Freq: Three times a day (TID) | ORAL | Status: DC
  Administered 2020-05-09 – 2020-05-10 (×4): 500 mg via ORAL
  Filled 2020-05-09 (×4): qty 1

## 2020-05-09 MED ORDER — POLYETHYLENE GLYCOL 3350 17 G PO PACK
17.0000 g | PACK | Freq: Two times a day (BID) | ORAL | Status: DC
  Filled 2020-05-09: qty 1

## 2020-05-09 NOTE — Plan of Care (Addendum)
  Problem: Clinical Measurements: Goal: Ability to maintain clinical measurements within normal limits will improve Outcome: Progressing Goal: Will remain free from infection Outcome: Progressing Goal: Diagnostic test results will improve Outcome: Progressing Goal: Respiratory complications will improve Outcome: Progressing Goal: Cardiovascular complication will be avoided Outcome: Progressing   Patient is alert and oriented. V/S stable. No complaints of pain. Voiding well. Refused to turn side to side.

## 2020-05-09 NOTE — Progress Notes (Signed)
PROGRESS NOTE    [name]  XAJ:[id number] DOB: 1940-07-18 DOA: 05/05/2020 PCP: Orvis Brill, [facility]    Brief Narrative:  [name] is a 80 y.o. female with medical history significant for chronic diastolic CHF (EF 09-47%), severe aortic stenosis, chronic atrial fibrillation on Eliquis, asthma, hypertension, hyperlipidemia, hypothyroidism, depression/anxiety, who is currently active with hospice and presents to the ED for evaluation of progressive lower extremity edema. Patient states she has been having progressive lower extremity edema in both of her legs over the last several weeks, significantly worse than her baseline.  She has seen some redness of the skin at the lower part of both legs.  She has been having shortness of breath with minimal exertion as well as orthopnea. 2 view chest x-ray shows stable small pleural effusion and/or atelectasis at the left lung base, no acute focal consolidation, edema.  Bilateral lower extremity ultrasounds are negative for evidence of DVT.   3/3-PT rec. SNF.  covid negative  3/4 no overnight issues. SNF bed ready on monday 3/5- denies wosening sob, feels LE edema improving.discussed with pt avoiding salty foods. No overnight issues. Still constipated. Good UO  Consultants:     Procedures:   Antimicrobials:       Subjective: Sob appears to be at baseline. Reports LE edema much better   Objective: Vitals:   05/08/20 2346 05/09/20 0500 05/09/20 0532 05/09/20 0738  BP: 109/67  104/65 96/65  Pulse: 74  79 70  Resp: 18  18 16   Temp: 98.1 F (36.7 C)  98.3 F (36.8 C) 98.5 F (36.9 C)  TempSrc:   Oral Oral  SpO2: 95%  96% 96%  Weight:  (!) 157.4 kg    Height:        Intake/Output Summary (Last 24 hours) at 05/09/2020 0816 Last data filed at 05/09/2020 0500 Gross per 24 hour  Intake 3 ml  Output 3400 ml  Net -3397 ml   Filed Weights   05/07/20 2044 05/08/20 0500 05/09/20 0500  Weight: (!) 162.4 kg (!)  162.9 kg (!) 157.4 kg    Examination: Nad, calm cta no w/r/r Regular s1/s2 no gallop Soft benign +bs Decrease LE edema. Mild erythema of LLE around inner medial shin aaxo3 grossly intact Mood and affect appropriate in current setting   Data Reviewed: I have personally reviewed following labs and imaging studies  CBC: Recent Labs  Lab 05/05/20 1400 05/06/20 0406  WBC 10.3 9.3  HGB 13.2 12.5  HCT 39.5 37.9  MCV 89.6 89.4  PLT 262 096   Basic Metabolic Panel: Recent Labs  Lab 05/05/20 1400 05/06/20 1028 05/07/20 0536 05/08/20 0459 05/09/20 0456  NA 135 136 138 137 137  K 3.9 4.1 3.7 3.4* 3.7  CL 97* 98 101 101 102  CO2 28 27 28 30 28   GLUCOSE 118* 149* 103* 111* 102*  BUN 12 12 12 12 12   CREATININE 0.80 0.91 0.88 0.82 0.72  CALCIUM 9.3 9.1 8.6* 8.5* 8.8*  MG  --  1.9  --   --   --    GFR: Estimated Creatinine Clearance: 87.5 mL/min (by C-G formula based on SCr of 0.72 mg/dL). Liver Function Tests: No results for input(s): AST, ALT, ALKPHOS, BILITOT, PROT, ALBUMIN in the last 168 hours. No results for input(s): LIPASE, AMYLASE in the last 168 hours. No results for input(s): AMMONIA in the last 168 hours. Coagulation Profile: No results for input(s): INR, PROTIME in the last 168 hours. Cardiac Enzymes: No results  for input(s): CKTOTAL, CKMB, CKMBINDEX, TROPONINI in the last 168 hours. BNP (last 3 results) No results for input(s): PROBNP in the last 8760 hours. HbA1C: No results for input(s): HGBA1C in the last 72 hours. CBG: No results for input(s): GLUCAP in the last 168 hours. Lipid Profile: No results for input(s): CHOL, HDL, LDLCALC, TRIG, CHOLHDL, LDLDIRECT in the last 72 hours. Thyroid Function Tests: Recent Labs    05/06/20 1028  TSH 3.333   Anemia Panel: No results for input(s): VITAMINB12, FOLATE, FERRITIN, TIBC, IRON, RETICCTPCT in the last 72 hours. Sepsis Labs: Recent Labs  Lab 05/05/20 1406  LATICACIDVEN 1.4    Recent Results (from  the past 240 hour(s))  SARS CORONAVIRUS 2 (TAT 6-24 HRS) Nasopharyngeal Nasopharyngeal Swab     Status: None   Collection Time: 05/05/20  6:07 PM   Specimen: Nasopharyngeal Swab  Result Value Ref Range Status   SARS Coronavirus 2 NEGATIVE NEGATIVE Final    Comment: (NOTE) SARS-CoV-2 target nucleic acids are NOT DETECTED.  The SARS-CoV-2 RNA is generally detectable in upper and lower respiratory specimens during the acute phase of infection. Negative results do not preclude SARS-CoV-2 infection, do not rule out co-infections with other pathogens, and should not be used as the sole basis for treatment or other patient management decisions. Negative results must be combined with clinical observations, patient history, and epidemiological information. The expected result is Negative.  Fact Sheet for Patients: SugarRoll.be  Fact Sheet for Healthcare Providers: https://www.woods-mathews.com/  This test is not yet approved or cleared by the [place] FDA and  has been authorized for detection and/or diagnosis of SARS-CoV-2 by FDA under an Emergency Use Authorization (EUA). This EUA will remain  in effect (meaning this test can be used) for the duration of the COVID-19 declaration under Se ction 564(b)(1) of the Act, 21 U.S.C. section 360bbb-3(b)(1), unless the authorization is terminated or revoked sooner.  Performed at [facility], [place] [address] [place], [place] [address]          Radiology Studies: No results found.      Scheduled Meds:  apixaban  5 mg Oral BID   atorvastatin  20 mg Oral QHS   ipratropium-albuterol  3 mL Nebulization BID   levothyroxine  125 mcg Oral QAC breakfast   nystatin   Topical TID   polyethylene glycol  17 g Oral Daily   potassium chloride SA  20 mEq Oral Daily   senna  1 tablet Oral Daily   sodium chloride flush  3 mL Intravenous Q12H   Continuous Infusions:  Assessment &  Plan:   Principal Problem:   Acute on chronic diastolic (congestive) heart failure (HCC) Active Problems:   Asthma, chronic   Essential hypertension, benign   Hypercholesterolemia   Hypothyroidism   Atrial fibrillation ([place])  [name] is a 80 y.o. female with medical history significant for chronic diastolic CHF (EF 81-85%), severe aortic stenosis, chronic atrial fibrillation on Eliquis, asthma, hypertension, hyperlipidemia, hypothyroidism, depression/anxiety, who is admitted with acute on chronic diastolic CHF.  Acute on chronic diastolic CHF exacerbation: Progressive over several weeks with increased lower extremity edema, orthopnea, DOE. Last EF 60-65% by TTE 08/24/2019. 3/6-bnp still elevated but pt clinically more euvolemic and asx. bp low, so will dc lasix today , avoiding over diuresis  Start lasix 20mg  qd in am, and needs 20mg  extra prn if water weight up by 3 lbs Continue I/o Daily weight     Chronic atrial fibrillation: Remains in  atrial fibrillation with controlled rate. Patient states she stopped Eliquis several months ago due to financial issues. CHA2DS2-VASc score is at least 5. 3/6-continue eliquis TOC consult for meds needs      Asthma: Stable without acute exacerbation  Continue MDI duo nebs    Hypertension: On low side continue to hold amlodipine  Lasix 40 IV twice daily to switch to 20 mg daily as above starting tomorrow      Hypothyroidism: Continue Synthroid.  Hyperlipidemia: Continue atorvastatin.  Severe aortic stenosis: Noted on TTE 08/24/2019.  Per documentation, at that time patient refused left and right heart cath and elected for palliative/hospice care. Patient reports that she was told she is not a candidate for intervention  Deconditioning/diminished mobility: PT OT recommend SNF  DVT prophylaxis: Eliquis Code Status: DNR Family Communication: left vm for son  Status is: inpatient  Patient remains inpatient due  to: Unsafe discharge   Dispo: The patient is from: Home              Anticipated d/c is to: SNF on monday              Patient currently is medically stable   Difficult to place patient No            LOS: 2 days   Time spent: 35 min with >50% on coc    Nolberto Hanlon, MD [facility] Pager [phone]  If 7PM-7AM, please contact night-coverage 05/09/2020, 8:16 AM

## 2020-05-10 LAB — CBC
HCT: 39.1 % (ref 36.0–46.0)
Hemoglobin: 12.7 g/dL (ref 12.0–15.0)
MCH: 29.7 pg (ref 26.0–34.0)
MCHC: 32.5 g/dL (ref 30.0–36.0)
MCV: 91.4 fL (ref 80.0–100.0)
Platelets: 271 10*3/uL (ref 150–400)
RBC: 4.28 MIL/uL (ref 3.87–5.11)
RDW: 15 % (ref 11.5–15.5)
WBC: 7.1 10*3/uL (ref 4.0–10.5)
nRBC: 0 % (ref 0.0–0.2)

## 2020-05-10 LAB — SARS CORONAVIRUS 2 (TAT 6-24 HRS): SARS Coronavirus 2: NEGATIVE

## 2020-05-10 MED ORDER — RISAQUAD PO CAPS: 1.0000 | ORAL_CAPSULE | Freq: Every day | ORAL | Status: AC

## 2020-05-10 MED ORDER — POLYETHYLENE GLYCOL 3350 17 G PO PACK: 17.0000 g | PACK | Freq: Every day | ORAL | 0 refills | Status: AC

## 2020-05-10 MED ORDER — CEPHALEXIN 500 MG PO CAPS: 500.0000 mg | ORAL_CAPSULE | Freq: Three times a day (TID) | ORAL | Status: DC

## 2020-05-10 MED ORDER — SENNA 8.6 MG PO TABS: 2.0000 | ORAL_TABLET | Freq: Every day | ORAL | 0 refills | Status: AC

## 2020-05-10 MED ORDER — FUROSEMIDE 20 MG PO TABS: 20.0000 mg | ORAL_TABLET | Freq: Every day | ORAL | Status: DC

## 2020-05-10 MED ORDER — FUROSEMIDE 20 MG PO TABS: 20.0000 mg | ORAL_TABLET | Freq: Every day | ORAL | Status: AC

## 2020-05-10 MED ORDER — APIXABAN 5 MG PO TABS: 5.0000 mg | ORAL_TABLET | Freq: Two times a day (BID) | ORAL | Status: DC

## 2020-05-10 NOTE — Care Management Important Message (Signed)
Important Message  Patient Details  Name: [name] MRN: [id number] Date of Birth: 22-Jun-1940   Medicare Important Message Given:  Yes     Dannette Barbara 05/10/2020, 12:18 PM

## 2020-05-10 NOTE — Treatment Plan (Signed)
LMOVM for Janett Billow to call writer back to give report. Was on hold for 12 minutes and transferred by [facility] 4 times with unavailability to speak to someone.

## 2020-05-10 NOTE — Progress Notes (Signed)
   05/10/20 1727  Vitals  Temp 97.7 F (36.5 C)  Temp Source Oral  BP 116/78  MAP (mmHg) 88  BP Location Left Arm  BP Method Automatic  Patient Position (if appropriate) Lying  Pulse Rate 72  Pulse Rate Source Dinamap  Resp 18  Oxygen Therapy  SpO2 98 %  O2 Device Room Air  EMS vitals

## 2020-05-10 NOTE — Care Plan (Signed)
Pt assisted to ambulance with 6 people assist. Pt belonging bags x 2 and small pillow sent with patient at this time. Large pillow with pillow case had to be thrown away as EMS not able to take it after patient has been educated x 2 days about her belongings and what could and couldn't go with EMS. Pt had 2 chuck pads placed between her legs per patient request for urine. Pt off unit in stable condition.

## 2020-05-10 NOTE — TOC Transition Note (Addendum)
Transition of Care [facility]) - CM/SW Discharge Note   Patient Details  Name: [name] MRN: [id number] Date of Birth: 1940/07/29  [facility] CM/SW Contact:  Beverly Sessions, RN Phone Number: 05/10/2020, 1:28 PM   Clinical Narrative:    Patient to discharge to [facility] today VM left for son to update.   Per [employer] due to patient revoking her hospice she will have to come in under her Medicare through March 31st.  Stephanie in the business office is aware.     DC info sent in the [place] [facility] packet and DNR on chart Kieth Brightly with Manufacturing engineer notified  Bedside RN to call report EMS transport called. Patient is 4th on the list  30 day eliquis coupon put in patient DC packet for her to use if still indicated after she discharges from SNF.  If son returns call Ill also offer to email him a copy of the coupon as well  Final next level of care: Skilled Nursing Facility Barriers to Discharge: No Barriers Identified   Patient Goals and CMS Choice        Discharge Placement              Patient chooses bed at: [facility] [place] Patient to be transferred to facility by: EMS Name of family member notified: Christin Bach Patient and family notified of of transfer: 05/10/20  Discharge Plan and Services   Discharge Planning Services: CM Consult                                 Social Determinants of Health (SDOH) Interventions     Readmission Risk Interventions No flowsheet data found.

## 2020-05-10 NOTE — Progress Notes (Signed)
[place] [facility] patient RN note:  Visited with patient in room. She is alert and oriented, resting in bed and in good spirits with no signs of distress.  Denies any concerns. Vital signs are stable. Swelling and redness to lower extremities have improved. Discussion had regarding Ms. Hammar choosing to revoke hospice medicare benefits in order to pursue rehab at [facility]. Carina is sure that this is the right choice and is excited about the prospect of improving with rehab. Revocation paperwork was completed and faxed to Medical records. I will notify Navina's hospice team and she is reassured knowing that she can call [facility] again once rehab is complete. EMS called and patient will transfer to [facility] today.   Thank you for the opportunity to participate in this patient's care.  Zandra Abts, RN [facility] Liaison [phone]

## 2020-05-10 NOTE — Care Plan (Signed)
Report called to Maudie Mercury, LPN at [facility].

## 2020-05-10 NOTE — Discharge Summary (Signed)
[name] [facility] SFK:[id number] DOB: [birth year] DOA: 05/05/2020  PCP: Orvis Brill[facility]  Admit date: 05/05/2020 Discharge date: 05/10/2020  Admitted From: home Disposition:  [facility]  Recommendations for Outpatient Follow-up:  1. Follow up with PCP in 1 week 2. Please obtain BMP/CBC in one week 3. Follow up with cardiology in one week     Discharge Condition:Stable CODE STATUS:DNR  Diet recommendation: Heart Healthy -low sodium  Brief/Interim Summary: Per HPI: [name] is a 80 y.o. female with medical history significant for chronic diastolic CHF (EF 17-49%), severe aortic stenosis, chronic atrial fibrillation on Eliquis, asthma, hypertension, hyperlipidemia, hypothyroidism, depression/anxiety, who is currently active with hospice and presents to the ED for evaluation of progressive lower extremity edema.She has been having shortness of breath with minimal exertion as well as orthopnea. She says she takes Lasix 40 mg twice a day.Covid test negative x2. She was admitted.Bilateral lower extremity ultrasounds are negative for evidence of DVT.chest x-ray shows stable , chronic small pleural effusion and/or atelectasis at the left lung base, no acute focal consolidation, edema. Patient was started on iv lasix for acute on chronic diastolic CHF. Marland Kitchen  Acute on chronic diastolic CHF exacerbation Last EF 60-65%  And severe aortic stenosis by TTE 08/24/2019. Her BNP was elevated. Treated with iv lasix 40mg  bid. Now clinically improved. Need monitoring I/'s Daily weight, if weight up by 3lbs , give extra dose of lasix 20mg  prn if bp tolerates She will be discharged on 20mg  daily as her bp on low side. Will need to monitor volume status closely. F/u with her cardiologist in one week Instructed her to avoid salty foods     Chronic atrial fibrillation: Remains in atrial fibrillation with controlled rate. Patient states she stopped Eliquis several months ago due to financial  issues. CHA2DS2-VASc score is at least 5. -Eliquis resumed here -TOC consulted for assistance   LLE cellulitis- had warmth and erythema. Started on Keflex 3/6, with clinical improvement. Keflex day 2/5 today.  Asthma: Stable without acute exacerbation  Continue inhaers   Hypertension: On low side. Hold bp meds, reevaluate bp .    Hypothyroidism: Continue Synthroid.  Hyperlipidemia: Continue atorvastatin.  Severe aortic stenosis: Noted on TTE 08/24/2019. Per documentation, at that time patient refused left and right heart cath and elected for palliative/hospice care. Patient reports that she was told she is not a candidate for intervention At this point , f/u with cardiology as outpatient  Deconditioning/diminished mobility: PT OT recommend SNF    Discharge Diagnoses:  Principal Problem:   Acute on chronic diastolic (congestive) heart failure (HCC) Active Problems:   Asthma, chronic   Essential hypertension, benign   Hypercholesterolemia   Hypothyroidism   Atrial fibrillation [facility])    Discharge Instructions  Discharge Instructions    Call MD for:  difficulty breathing, headache or visual disturbances   Complete by: As directed    Diet - low sodium heart healthy   Complete by: As directed    Discharge instructions   Complete by: As directed    Eat low sodium food   Discharge wound care:   Complete by: As directed    Clean area daily   Increase activity slowly   Complete by: As directed      Allergies as of 05/10/2020      Reactions   Morphine And Related    Vicodin [hydrocodone-acetaminophen] Other (See Comments)   Reaction:  Raises pts BP       Medication List    STOP  taking these medications   amLODipine 5 MG tablet Commonly known as: NORVASC   aspirin 81 MG chewable tablet   clopidogrel 75 MG tablet Commonly known as: PLAVIX   potassium chloride SA 20 MEQ tablet Commonly known as: KLOR-CON     TAKE these medications    acetaminophen 500 MG tablet Commonly known as: TYLENOL Take 500-1,000 mg by mouth every 6 (six) hours as needed for mild pain or fever.   acidophilus Caps capsule Take 1 capsule by mouth daily.   albuterol 108 (90 Base) MCG/ACT inhaler Commonly known as: VENTOLIN HFA Inhale 2 puffs into the lungs every 6 (six) hours as needed for wheezing or shortness of breath.   apixaban 5 MG Tabs tablet Commonly known as: ELIQUIS Take 1 tablet (5 mg total) by mouth 2 (two) times daily.   atorvastatin 20 MG tablet Commonly known as: LIPITOR Take 1 tablet (20 mg total) by mouth at bedtime.   Calcium Carbonate-Vitamin D 600-400 MG-UNIT tablet Take 1 tablet by mouth daily after lunch.   cephALEXin 500 MG capsule Commonly known as: KEFLEX Take 1 capsule (500 mg total) by mouth every 8 (eight) hours.   furosemide 20 MG tablet Commonly known as: LASIX Take 1 tablet (20 mg total) by mouth daily. What changed:   how much to take  when to take this  reasons to take this   ipratropium-albuterol 0.5-2.5 (3) MG/3ML Soln Commonly known as: DUONEB Take 3 mLs by nebulization 2 (two) times daily.   levothyroxine 125 MCG tablet Commonly known as: SYNTHROID Take 125 mcg by mouth daily before breakfast.   loratadine 10 MG tablet Commonly known as: CLARITIN Take 10 mg by mouth daily.   nystatin cream Commonly known as: MYCOSTATIN Apply 1 application topically 2 (two) times daily.   polyethylene glycol 17 g packet Commonly known as: MIRALAX / GLYCOLAX Take 17 g by mouth daily.   senna 8.6 MG Tabs tablet Commonly known as: SENOKOT Take 2 tablets (17.2 mg total) by mouth daily.   vitamin B-12 1000 MCG tablet Commonly known as: CYANOCOBALAMIN Take 1,000 mcg by mouth daily.            Discharge Care Instructions  (From admission, onward)         Start     Ordered   05/10/20 0000  Discharge wound care:       Comments: Clean area daily   05/10/20 0931          Contact  information for follow-up providers    [facility] Follow up in 1 week(s).   Specialty: Geriatric Medicine Contact information: [place] [address] [phone]        Minus Breeding, MD Follow up in 1 week(s).   Specialty: Cardiology Contact information: [address] [place] [address] [phone]            Contact information for after-discharge care    Destination    [facility] Preferred SNF .   Service: Skilled Nursing Contact information: [place] [phone]                 Allergies  Allergen Reactions  . Morphine And Related   . Vicodin [Hydrocodone-Acetaminophen] Other (See Comments)    Reaction:  Raises pts BP     Consultations:     Procedures/Studies: DG Chest 2 View  Result Date: 05/05/2020 CLINICAL DATA:  Shortness of breath,  redness and swelling to LEFT lower extremity. EXAM: CHEST - 2 VIEW COMPARISON:  Chest x-rays dated 08/22/2019 09/01/2016. FINDINGS: Heart size and mediastinal contours are stable. Small pleural effusion and/or atelectasis at the LEFT lung base, stable and likely chronic. No new lung findings. No pneumothorax. No acute appearing osseous abnormality. IMPRESSION: No active cardiopulmonary disease. No evidence of pneumonia or pulmonary edema. Electronically Signed   By: Franki Cabot M.D.   On: 05/05/2020 18:29   US Venous Img Lower Bilateral  Result Date: 05/05/2020 CLINICAL DATA:  Shortness of breath, lower extremity pain and edema EXAM: BILATERAL LOWER EXTREMITY VENOUS DOPPLER ULTRASOUND TECHNIQUE: Gray-scale sonography with compression, as well as color and duplex ultrasound, were performed to evaluate the deep venous system(s) from the level of the common femoral vein through the popliteal and proximal calf veins. COMPARISON:  06/25/2014 FINDINGS: VENOUS Normal compressibility of the common femoral,  superficial femoral, and popliteal veins, as well as the visualized calf veins. Visualized portions of profunda femoral vein and great saphenous vein unremarkable. No filling defects to suggest DVT on grayscale or color Doppler imaging. Doppler waveforms show normal direction of venous flow, normal respiratory plasticity and response to augmentation. OTHER None. Limitations: none IMPRESSION: 1. No evidence of deep venous thrombosis within either lower extremity. Electronically Signed   By: Randa Ngo M.D.   On: 05/05/2020 19:03      Subjective: No sob, lower extremity edema better. No issues  Discharge Exam: Vitals:   05/10/20 0744 05/10/20 0844  BP:  (!) 136/120  Pulse:  81  Resp:  16  Temp:  97.8 F (36.6 C)  SpO2: 94% 96%   Vitals:   05/09/20 2209 05/10/20 0501 05/10/20 0744 05/10/20 0844  BP: 109/61 107/62  (!) 136/120  Pulse: 74 71  81  Resp: 18 20  16   Temp: 98.6 F (37 C) 98 F (36.7 C)  97.8 F (36.6 C)  TempSrc:  Oral  Oral  SpO2: 93% 95% 94% 96%  Weight:  (!) 158.6 kg    Height:        General: Pt is alert, awake, not in acute distress Cardiovascular: RRR, S1/S2 +, no rubs, no gallops Respiratory: CTA bilaterally, no wheezing, no rhonchi Abdominal: Soft, NT, ND, bowel sounds + Extremities: overall decreased edema, decrease warmth and erythema of LLE    The results of significant diagnostics from this hospitalization (including imaging, microbiology, ancillary and laboratory) are listed below for reference.     Microbiology: Recent Results (from the past 240 hour(s))  SARS CORONAVIRUS 2 (TAT 6-24 HRS) Nasopharyngeal Nasopharyngeal Swab     Status: None   Collection Time: 05/05/20  6:07 PM   Specimen: Nasopharyngeal Swab  Result Value Ref Range Status   SARS Coronavirus 2 NEGATIVE NEGATIVE Final    Comment: (NOTE) SARS-CoV-2 target nucleic acids are NOT DETECTED.  The SARS-CoV-2 RNA is generally detectable in upper and lower respiratory specimens  during the acute phase of infection. Negative results do not preclude SARS-CoV-2 infection, do not rule out co-infections with other pathogens, and should not be used as the sole basis for treatment or other patient management decisions. Negative results must be combined with clinical observations, patient history, and epidemiological information. The expected result is Negative.  Fact Sheet for Patients: SugarRoll.be  Fact Sheet for Healthcare Providers: https://www.woods-mathews.com/  This test is not yet approved or cleared by the [place] FDA and  has been authorized for detection and/or diagnosis of SARS-CoV-2 by FDA under an Emergency  Use Authorization (EUA). This EUA will remain  in effect (meaning this test can be used) for the duration of the COVID-19 declaration under Se ction 564(b)(1) of the Act, 21 U.S.C. section 360bbb-3(b)(1), unless the authorization is terminated or revoked sooner.  Performed at [facility], [place] [address] [place], [place] [address]   SARS CORONAVIRUS 2 (TAT 6-24 HRS) Nasopharyngeal Nasopharyngeal Swab     Status: None   Collection Time: 05/09/20  3:54 PM   Specimen: Nasopharyngeal Swab  Result Value Ref Range Status   SARS Coronavirus 2 NEGATIVE NEGATIVE Final    Comment: (NOTE) SARS-CoV-2 target nucleic acids are NOT DETECTED.  The SARS-CoV-2 RNA is generally detectable in upper and lower respiratory specimens during the acute phase of infection. Negative results do not preclude SARS-CoV-2 infection, do not rule out co-infections with other pathogens, and should not be used as the sole basis for treatment or other patient management decisions. Negative results must be combined with clinical observations, patient history, and epidemiological information. The expected result is Negative.  Fact Sheet for Patients: SugarRoll.be  Fact Sheet for Healthcare  Providers: https://www.woods-mathews.com/  This test is not yet approved or cleared by the [place] FDA and  has been authorized for detection and/or diagnosis of SARS-CoV-2 by FDA under an Emergency Use Authorization (EUA). This EUA will remain  in effect (meaning this test can be used) for the duration of the COVID-19 declaration under Se ction 564(b)(1) of the Act, 21 U.S.C. section 360bbb-3(b)(1), unless the authorization is terminated or revoked sooner.  Performed at [facility], [place] [address], [place], [place] [address]      Labs: BNP (last 3 results) Recent Labs    05/05/20 1400 05/08/20 0459 05/09/20 0456  BNP 465.1* 551.3* 563.8*   Basic Metabolic Panel: Recent Labs  Lab 05/05/20 1400 05/06/20 1028 05/07/20 0536 05/08/20 0459 05/09/20 0456  NA 135 136 138 137 137  K 3.9 4.1 3.7 3.4* 3.7  CL 97* 98 101 101 102  CO2 28 27 28 30 28   GLUCOSE 118* 149* 103* 111* 102*  BUN 12 12 12 12 12   CREATININE 0.80 0.91 0.88 0.82 0.72  CALCIUM 9.3 9.1 8.6* 8.5* 8.8*  MG  --  1.9  --   --   --    Liver Function Tests: No results for input(s): AST, ALT, ALKPHOS, BILITOT, PROT, ALBUMIN in the last 168 hours. No results for input(s): LIPASE, AMYLASE in the last 168 hours. No results for input(s): AMMONIA in the last 168 hours. CBC: Recent Labs  Lab 05/05/20 1400 05/06/20 0406 05/10/20 0455  WBC 10.3 9.3 7.1  HGB 13.2 12.5 12.7  HCT 39.5 37.9 39.1  MCV 89.6 89.4 91.4  PLT 262 267 271   Cardiac Enzymes: No results for input(s): CKTOTAL, CKMB, CKMBINDEX, TROPONINI in the last 168 hours. BNP: Invalid input(s): POCBNP CBG: No results for input(s): GLUCAP in the last 168 hours. D-Dimer No results for input(s): DDIMER in the last 72 hours. Hgb A1c No results for input(s): HGBA1C in the last 72 hours. Lipid Profile No results for input(s): CHOL, HDL, LDLCALC, TRIG, CHOLHDL, LDLDIRECT in the last 72 hours. Thyroid function studies No results  for input(s): TSH, T4TOTAL, T3FREE, THYROIDAB in the last 72 hours.  Invalid input(s): FREET3 Anemia work up No results for input(s): VITAMINB12, FOLATE, FERRITIN, TIBC, IRON, RETICCTPCT in the last 72 hours. Urinalysis    Component Value Date/Time   COLORURINE STRAW (A) 09/01/2016 2350  APPEARANCEUR CLEAR (A) 09/01/2016 2350   APPEARANCEUR Clear 10/12/2013 0500   LABSPEC 1.004 (L) 09/01/2016 2350   LABSPEC 1.003 10/12/2013 0500   PHURINE 6.0 09/01/2016 2350   GLUCOSEU NEGATIVE 09/01/2016 2350   GLUCOSEU Negative 10/12/2013 0500   GLUCOSEU NEGATIVE 04/25/2012 0949   HGBUR SMALL (A) 09/01/2016 2350   BILIRUBINUR NEGATIVE 09/01/2016 2350   BILIRUBINUR Negative 10/12/2013 0500   KETONESUR NEGATIVE 09/01/2016 2350   PROTEINUR NEGATIVE 09/01/2016 2350   UROBILINOGEN 0.2 04/25/2012 0949   NITRITE NEGATIVE 09/01/2016 2350   LEUKOCYTESUR NEGATIVE 09/01/2016 2350   LEUKOCYTESUR 1+ 10/12/2013 0500   Sepsis Labs Invalid input(s): PROCALCITONIN,  WBC,  LACTICIDVEN Microbiology Recent Results (from the past 240 hour(s))  SARS CORONAVIRUS 2 (TAT 6-24 HRS) Nasopharyngeal Nasopharyngeal Swab     Status: None   Collection Time: 05/05/20  6:07 PM   Specimen: Nasopharyngeal Swab  Result Value Ref Range Status   SARS Coronavirus 2 NEGATIVE NEGATIVE Final    Comment: (NOTE) SARS-CoV-2 target nucleic acids are NOT DETECTED.  The SARS-CoV-2 RNA is generally detectable in upper and lower respiratory specimens during the acute phase of infection. Negative results do not preclude SARS-CoV-2 infection, do not rule out co-infections with other pathogens, and should not be used as the sole basis for treatment or other patient management decisions. Negative results must be combined with clinical observations, patient history, and epidemiological information. The expected result is Negative.  Fact Sheet for Patients: SugarRoll.be  Fact Sheet for Healthcare  Providers: https://www.woods-mathews.com/  This test is not yet approved or cleared by the [place] FDA and  has been authorized for detection and/or diagnosis of SARS-CoV-2 by FDA under an Emergency Use Authorization (EUA). This EUA will remain  in effect (meaning this test can be used) for the duration of the COVID-19 declaration under Se ction 564(b)(1) of the Act, 21 U.S.C. section 360bbb-3(b)(1), unless the authorization is terminated or revoked sooner.  Performed at [facility], [place] [address], [place], [place] [address]   SARS CORONAVIRUS 2 (TAT 6-24 HRS) Nasopharyngeal Nasopharyngeal Swab     Status: None   Collection Time: 05/09/20  3:54 PM   Specimen: Nasopharyngeal Swab  Result Value Ref Range Status   SARS Coronavirus 2 NEGATIVE NEGATIVE Final    Comment: (NOTE) SARS-CoV-2 target nucleic acids are NOT DETECTED.  The SARS-CoV-2 RNA is generally detectable in upper and lower respiratory specimens during the acute phase of infection. Negative results do not preclude SARS-CoV-2 infection, do not rule out co-infections with other pathogens, and should not be used as the sole basis for treatment or other patient management decisions. Negative results must be combined with clinical observations, patient history, and epidemiological information. The expected result is Negative.  Fact Sheet for Patients: SugarRoll.be  Fact Sheet for Healthcare Providers: https://www.woods-mathews.com/  This test is not yet approved or cleared by the [place] FDA and  has been authorized for detection and/or diagnosis of SARS-CoV-2 by FDA under an Emergency Use Authorization (EUA). This EUA will remain  in effect (meaning this test can be used) for the duration of the COVID-19 declaration under Se ction 564(b)(1) of the Act, 21 U.S.C. section 360bbb-3(b)(1), unless the authorization is terminated or revoked  sooner.  Performed at [facility], [place] [address] [place], [place] [address]      Time coordinating discharge: Over 30 minutes  SIGNED:   Nolberto Hanlon, MD  [facility] 05/10/2020, 9:40 AM Pager   If 7PM-7AM, please contact  night-coverage [email or web address] Password [email or web address]

## 2020-06-28 NOTE — Progress Notes (Deleted)
Cardiology Office Note   Date:  06/28/2020   ID:  Brooke, Alvarez April 17, 1940, MRN [id number]  PCP:  [facility]  Cardiologist:   No primary care provider on file. Referring:  ***  No chief complaint on file.     History of Present Illness: [name] is a 80 y.o. female who presents for follow-up of diastolic heart failure, severe aortic stenosis and moderately severe left ventricular hypertrophy.  I saw her in June 2021 for this.  However, she was thought to have poor prognosis for any intervention and there was no plan for valve replacement.  She also has atrial fibrillation.  She was actually going to be referred to hospice at that time.  He has not seen her back in follow-up.  Since I saw her I do see that she was in the hospital in March and I reviewed these records for this appointment.  She had acute diastolic heart failure.  She was diuresed with IV Lasix.  Her blood pressure was low so she was only discharged on 20 mg.***    She did have an echo in June 2021 for evaluation of apparent acute diastolic heart failure.  Her ejection fraction was well-preserved.  There was moderate left ventricular hypertrophy.  There was moderate mitral valve regurgitation.  She had severe aortic valve stenosis with a mean gradient of 48.   Past Medical History:  Diagnosis Date  . Asthma   . COPD (chronic obstructive pulmonary disease) ([place])   . Depression   . GERD (gastroesophageal reflux disease)   . Hyperlipidemia   . Hypertension   . Hypothyroidism   . Morbid obesity ([place])   . Osteoarthritis   . PMB (postmenopausal bleeding)   . Sleep apnea   . Vaginal atrophy     Past Surgical History:  Procedure Laterality Date  . ABDOMINAL HYSTERECTOMY  2014   [facility]  . CHOLECYSTECTOMY    . DILATION AND CURETTAGE OF UTERUS    . UMBILICAL HERNIA REPAIR       Current Outpatient Medications  Medication Sig Dispense Refill  . acetaminophen (TYLENOL) 500 MG tablet  Take 500-1,000 mg by mouth every 6 (six) hours as needed for mild pain or fever.     Marland Kitchen acidophilus (RISAQUAD) CAPS capsule Take 1 capsule by mouth daily.    Marland Kitchen albuterol (PROVENTIL HFA;VENTOLIN HFA) 108 (90 BASE) MCG/ACT inhaler Inhale 2 puffs into the lungs every 6 (six) hours as needed for wheezing or shortness of breath.     Marland Kitchen apixaban (ELIQUIS) 5 MG TABS tablet Take 1 tablet (5 mg total) by mouth 2 (two) times daily. 60 tablet   . atorvastatin (LIPITOR) 20 MG tablet Take 1 tablet (20 mg total) by mouth at bedtime. 30 tablet 0  . Calcium Carbonate-Vitamin D 600-400 MG-UNIT tablet Take 1 tablet by mouth daily after lunch.     . cephALEXin (KEFLEX) 500 MG capsule Take 1 capsule (500 mg total) by mouth every 8 (eight) hours.    . furosemide (LASIX) 20 MG tablet Take 1 tablet (20 mg total) by mouth daily. 30 tablet   . ipratropium-albuterol (DUONEB) 0.5-2.5 (3) MG/3ML SOLN Take 3 mLs by nebulization 2 (two) times daily. 360 mL 1  . levothyroxine (SYNTHROID, LEVOTHROID) 125 MCG tablet Take 125 mcg by mouth daily before breakfast.    . loratadine (CLARITIN) 10 MG tablet Take 10 mg by mouth daily.    Marland Kitchen nystatin cream (MYCOSTATIN) Apply 1 application topically 2 (  two) times daily.     . polyethylene glycol (MIRALAX / GLYCOLAX) 17 g packet Take 17 g by mouth daily. 14 each 0  . senna (SENOKOT) 8.6 MG TABS tablet Take 2 tablets (17.2 mg total) by mouth daily. 120 tablet 0  . vitamin B-12 (CYANOCOBALAMIN) 1000 MCG tablet Take 1,000 mcg by mouth daily.     No current facility-administered medications for this visit.    Allergies:   Morphine and related and Vicodin [hydrocodone-acetaminophen]    Social History:  The patient  reports that she has never smoked. She has never used smokeless tobacco. She reports that she does not drink alcohol and does not use drugs.   Family History:  The patient's ***family history includes Alzheimer's disease in her mother; Brain cancer in her brother; Leukemia in her  mother.    ROS:  Please see the history of present illness.   Otherwise, review of systems are positive for {NONE DEFAULTED:18576::"none"}.   All other systems are reviewed and negative.    PHYSICAL EXAM: VS:  LMP 04/16/1993  , BMI There is no height or weight on file to calculate BMI. GENERAL:  *** appearing HEENT:  Pupils equal round and reactive, fundi not visualized, oral mucosa unremarkable NECK:  No jugular venous distention, waveform within normal limits, carotid upstroke brisk and symmetric, no bruits, no thyromegaly LYMPHATICS:  No cervical, inguinal adenopathy LUNGS:  Clear to auscultation bilaterally BACK:  No CVA tenderness CHEST:  Unremarkable HEART:  PMI not displaced or sustained,S1 and S2 within normal limits, no S3, no clicks, no rubs, *** murmurs, irregular  ABD:  Flat, positive bowel sounds normal in frequency in pitch, no bruits, no rebound, no guarding, no midline pulsatile mass, no hepatomegaly, no splenomegaly EXT:  2 plus pulses throughout, no edema, no cyanosis no clubbing   EKG:  EKG {ACTION; IS/IS ZOX:[id number]} ordered today. The ekg ordered today demonstrates ***   Recent Labs: 05/06/2020: Magnesium 1.9; TSH 3.333 05/09/2020: B Natriuretic Peptide 577.3; BUN 12; Creatinine, Ser 0.72; Potassium 3.7; Sodium 137 05/10/2020: Hemoglobin 12.7; Platelets 271    Lipid Panel    Component Value Date/Time   CHOL 182 04/16/2012 1141   TRIG 175.0 (H) 04/16/2012 1141   HDL 49.50 04/16/2012 1141   CHOLHDL 4 04/16/2012 1141   VLDL 35.0 04/16/2012 1141   LDLCALC 98 04/16/2012 1141      Wt Readings from Last 3 Encounters:  05/10/20 (!) 349 lb 10.4 oz (158.6 kg)  08/28/19 (!) 372 lb 6.4 oz (168.9 kg)  11/27/17 (!) 399 lb 0.5 oz (181 kg)      Other studies Reviewed: Additional studies/ records that were reviewed today include: ***. Review of the above records demonstrates:  Please see elsewhere in the note.  ***   ASSESSMENT AND PLAN:  CHRONIC DIASTOLIC HF:   ***  AORTIC STENOSIS:  ***  ATRIAL FIB CHRONIC:  ***   Current medicines are reviewed at length with the patient today.  The patient {ACTIONS; HAS/DOES NOT HAVE:19233} concerns regarding medicines.  The following changes have been made:  {PLAN; NO CHANGE:13088:s}  Labs/ tests ordered today include: *** No orders of the defined types were placed in this encounter.    Disposition:   FU with ***    Signed, Minus Breeding, MD  06/28/2020 7:06 PM    [facility]

## 2020-06-29 ENCOUNTER — Ambulatory Visit: Payer: Medicare HMO | Admitting: Cardiology

## 2020-06-29 DIAGNOSIS — I482 Chronic atrial fibrillation, unspecified: Secondary | ICD-10-CM

## 2020-06-29 DIAGNOSIS — I350 Nonrheumatic aortic (valve) stenosis: Secondary | ICD-10-CM

## 2020-06-29 DIAGNOSIS — I5032 Chronic diastolic (congestive) heart failure: Secondary | ICD-10-CM

## 2021-12-07 IMAGING — CT CT ANGIO CHEST
2 of 6 series · 19 of 46 positions shown · IV contrast (APPLIED)
Comparison: Chest x-ray dated 08/22/2019

CLINICAL DATA: Shortness of breath.

EXAM:
CT ANGIOGRAPHY CHEST WITH CONTRAST
TECHNIQUE: Multidetector CT imaging of the chest was performed using the
standard protocol during bolus administration of intravenous
contrast. Multiplanar CT image reconstructions and MIPs were
obtained to evaluate the vascular anatomy.
CONTRAST:  100mL OMNIPAQUE IOHEXOL 350 MG/ML SOLN

[Series 5: thins · axial · 0.70mm/px · z∈[-31,+187]mm · 17 of 240 slices shown]
[im 11/240  lung]
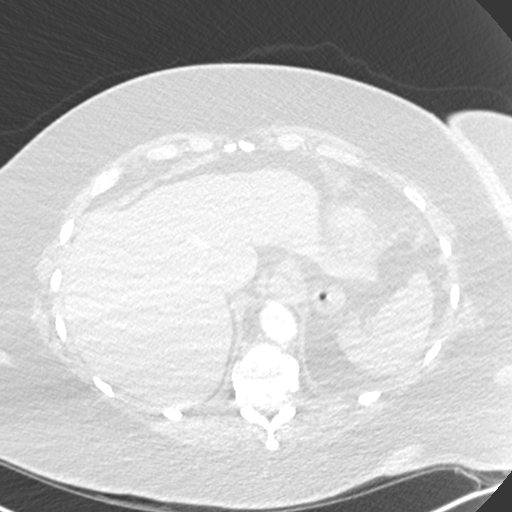
[im 21/240  soft-tissue]
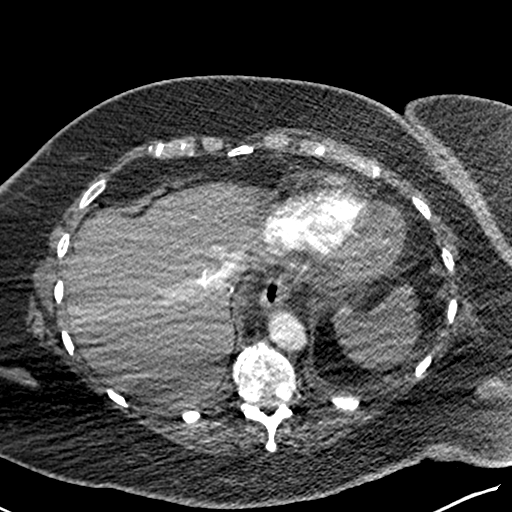
[im 42/240  lung]
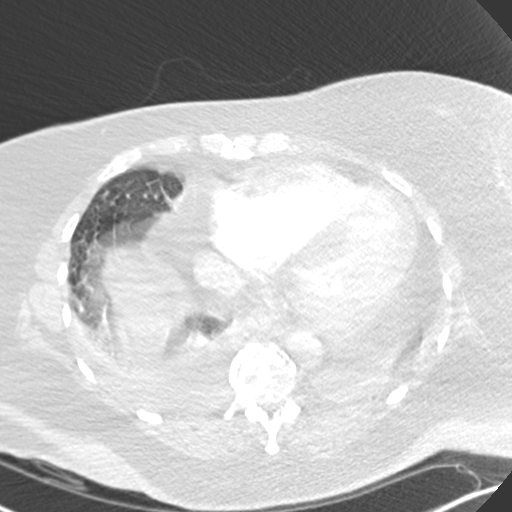
[im 52/240  soft-tissue]
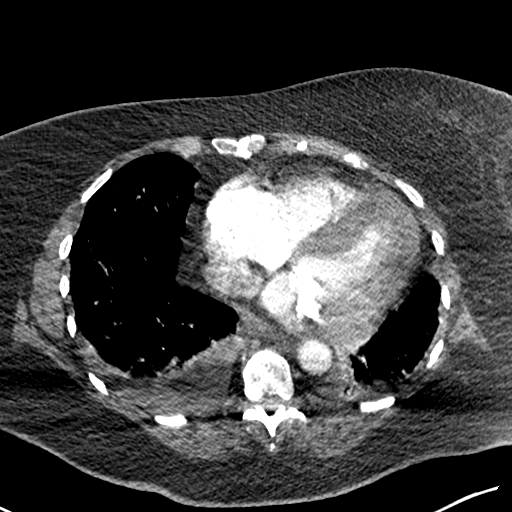
[im 63/240  lung]
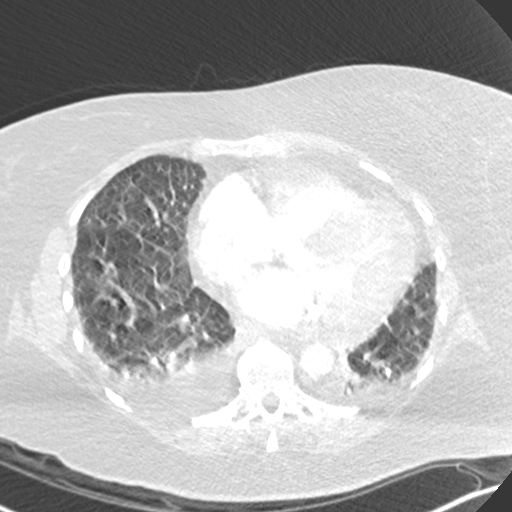
[im 84/240  soft-tissue]
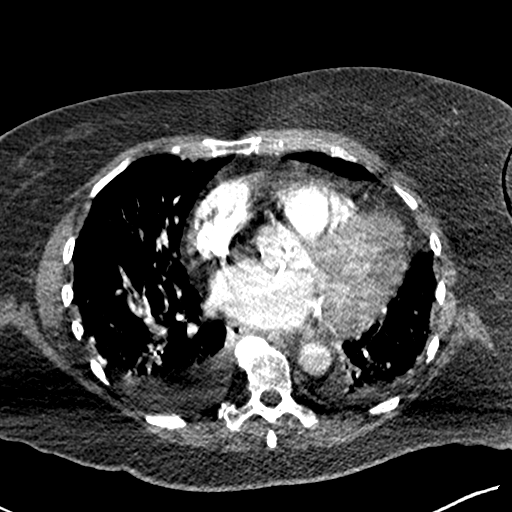
[im 94/240  lung]
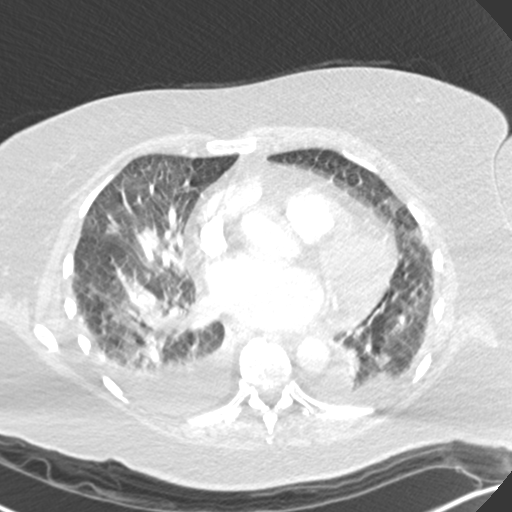
[im 104/240  soft-tissue]
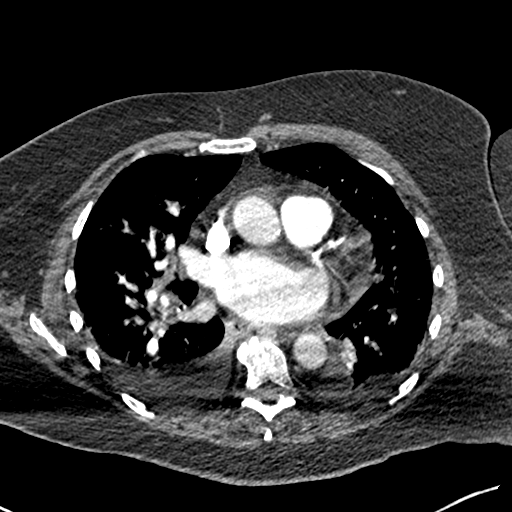
[im 125/240  lung]
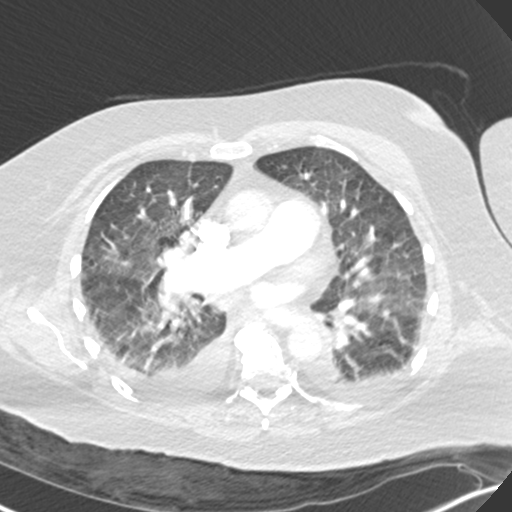
[im 136/240  soft-tissue]
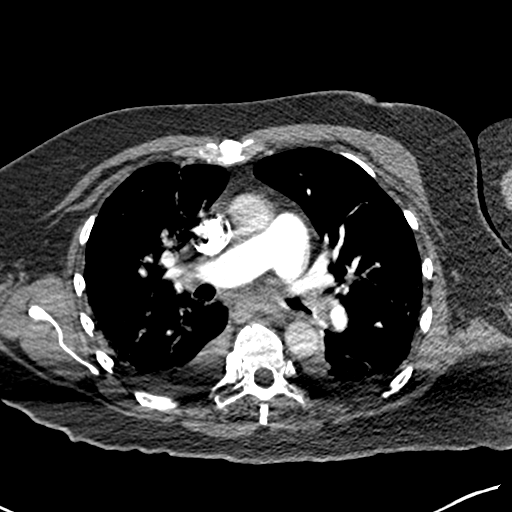
[im 146/240  lung]
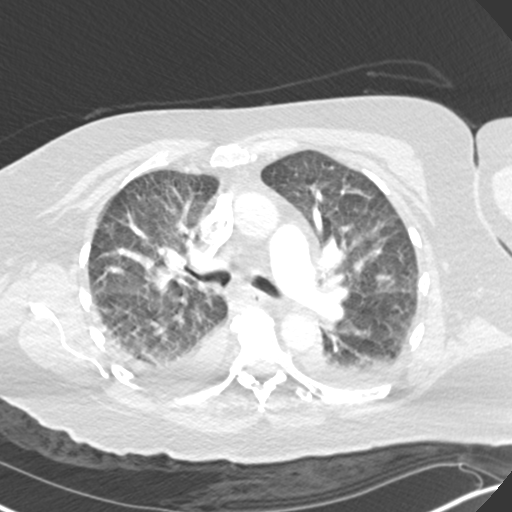
[im 156/240  soft-tissue]
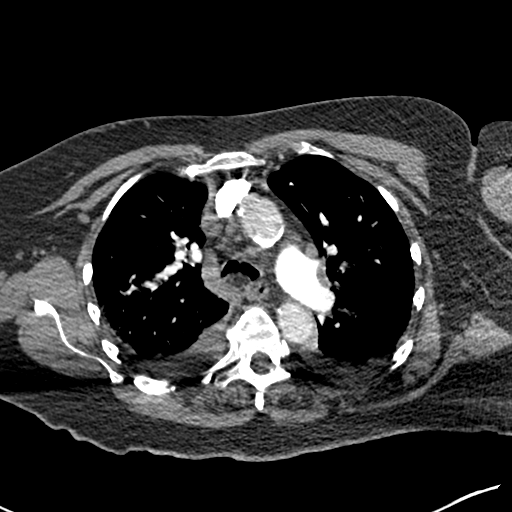
[im 177/240  lung]
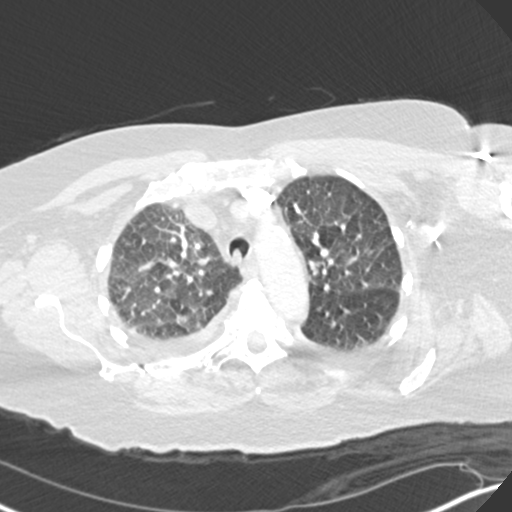
[im 188/240  soft-tissue]
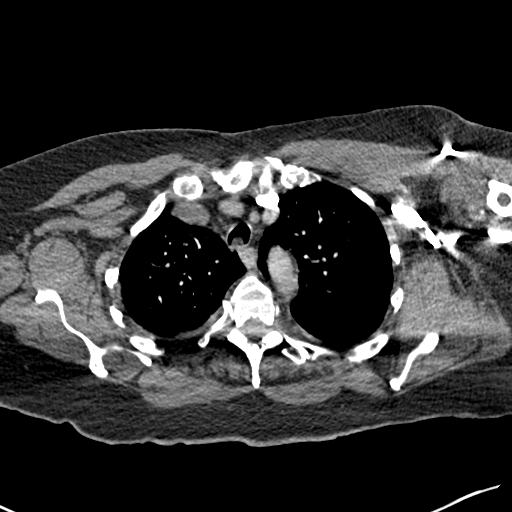
[im 198/240  lung]
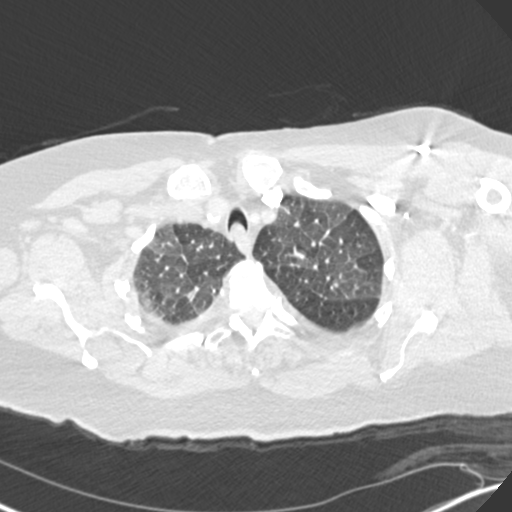
[im 219/240  soft-tissue]
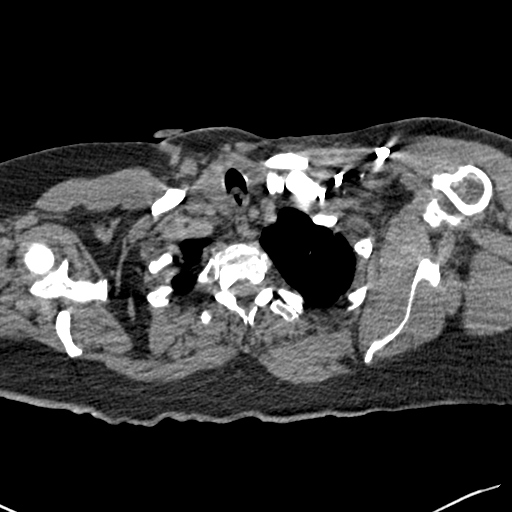
[im 229/240  lung]
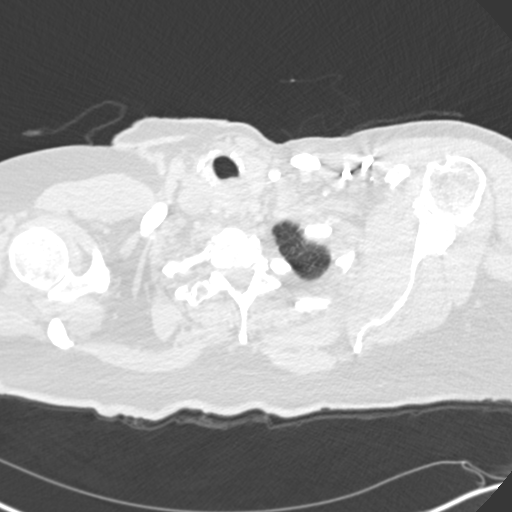

[Series 7: coronal mpr · coronal · 0.49mm/px · 2 of 92 slices shown]
[im 31/92  soft-tissue]
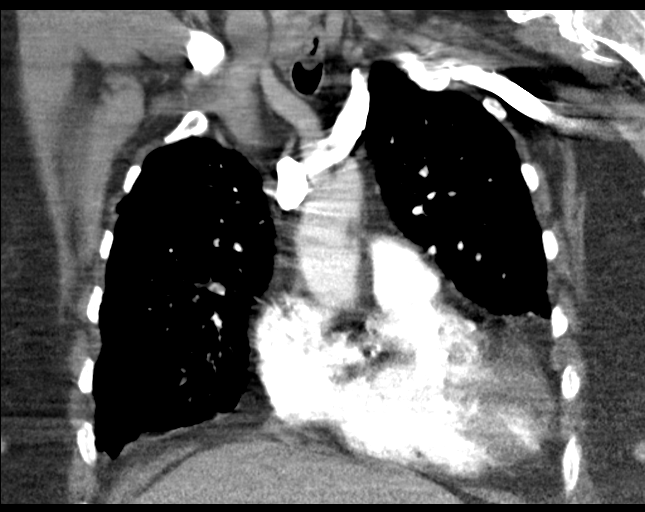
[im 61/92  soft-tissue]
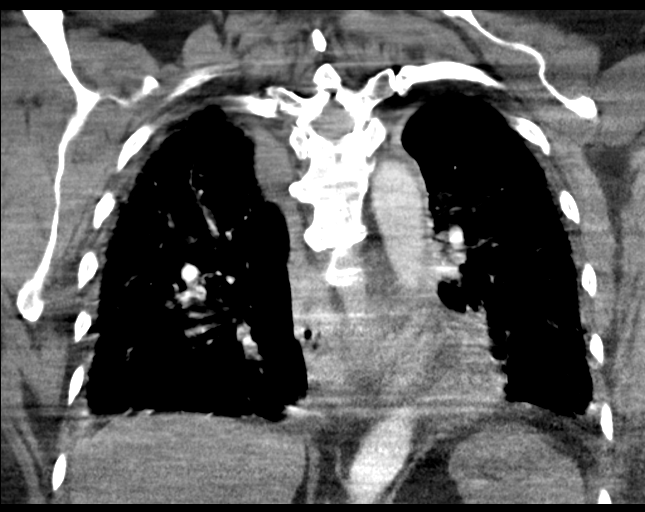

[19 of 46 positions shown; findings below may reference images not displayed]

FINDINGS: Cardiovascular: No pulmonary emboli. Aortic atherosclerosis.
Coronary artery calcifications. Calcification in the aortic valve
and mitral valve annulus. Or lung cardiomegaly. Trace pericardial
effusion.

Mediastinum/Nodes: Thyroid gland is normal. No hilar or mediastinal
adenopathy. Tiny hiatal hernia.

Lungs/Pleura: Slight bilateral interstitial edema. Small bilateral
pleural effusions, right greater than left.

Upper Abdomen: Normal.

Musculoskeletal: No chest wall abnormality. No acute or significant
osseous findings.

Review of the MIP images confirms the above findings.
IMPRESSION: 1. No pulmonary emboli.
2. Cardiomegaly with mild interstitial edema and small bilateral
pleural effusions, right greater than left. Findings are consistent
with congestive heart failure.
3. Aortic atherosclerosis.

Aortic Atherosclerosis ([id number]).

## 2022-08-21 IMAGING — US US EXTREM LOW VENOUS
1 series · 14 of 24 positions shown · non-contrast
Comparison: 06/25/2014

CLINICAL DATA: Shortness of breath, lower extremity pain and edema

EXAM:
BILATERAL LOWER EXTREMITY VENOUS DOPPLER ULTRASOUND
TECHNIQUE: Gray-scale sonography with compression, as well as color and duplex
ultrasound, were performed to evaluate the deep venous system(s)
from the level of the common femoral vein through the popliteal and
proximal calf veins.

[Series 1: us venous img lower bilat (dvt) · portal-venous · 14 of 58 slices shown]
[im 1/58]
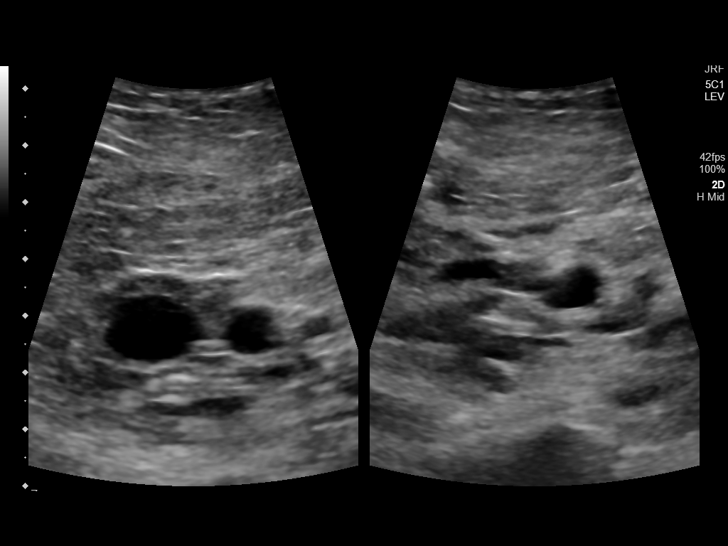
[im 5/58]
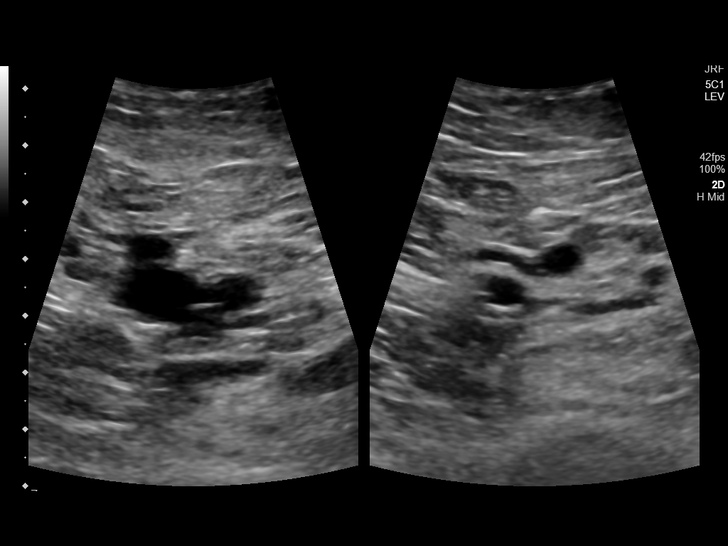
[im 10/58]
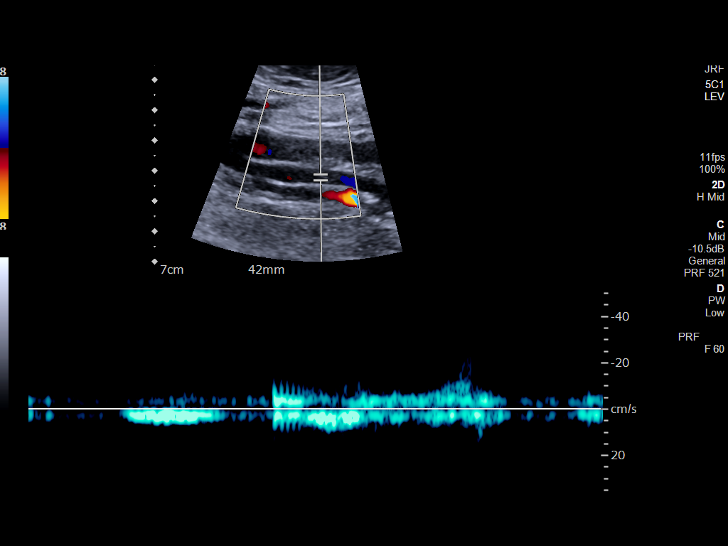
[im 15/58]
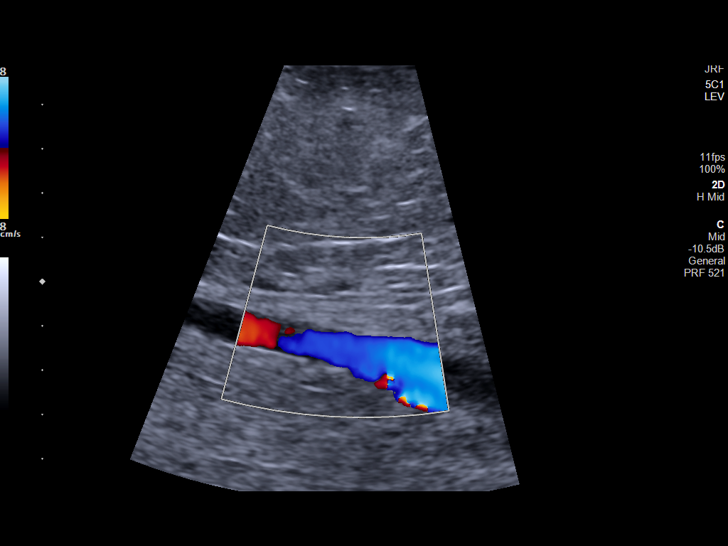
[im 18/58]
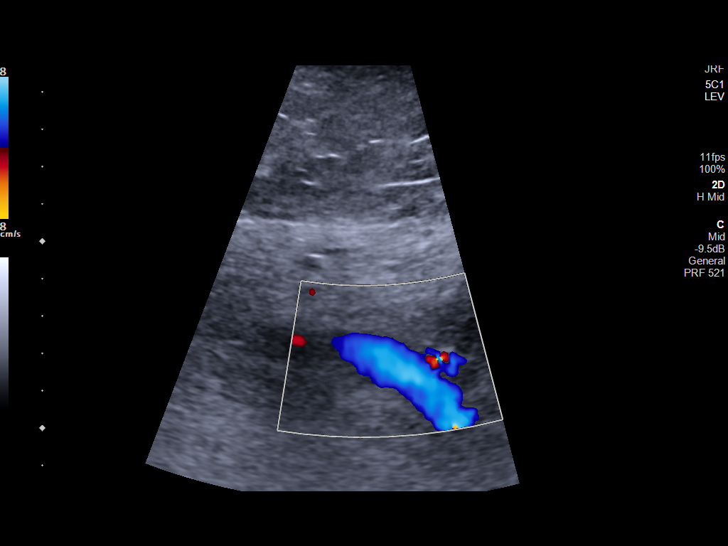
[im 23/58]
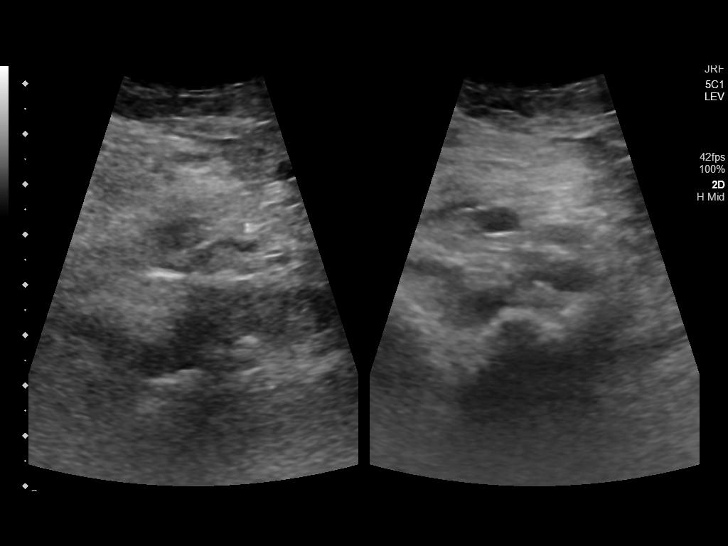
[im 28/58]
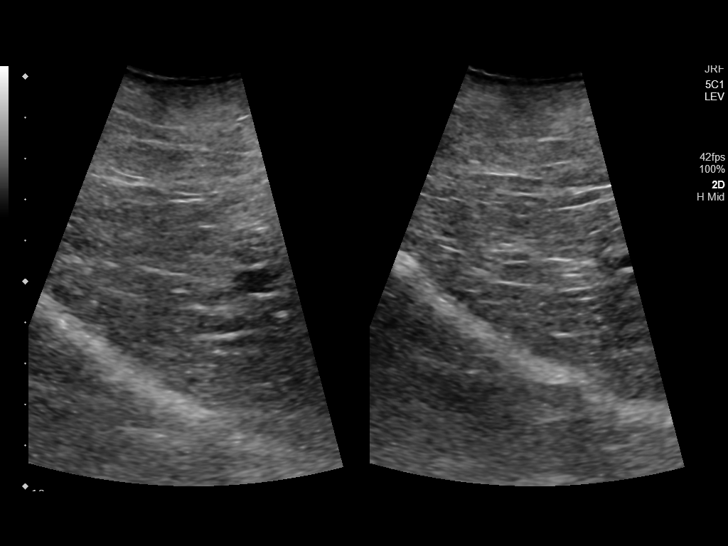
[im 30/58]
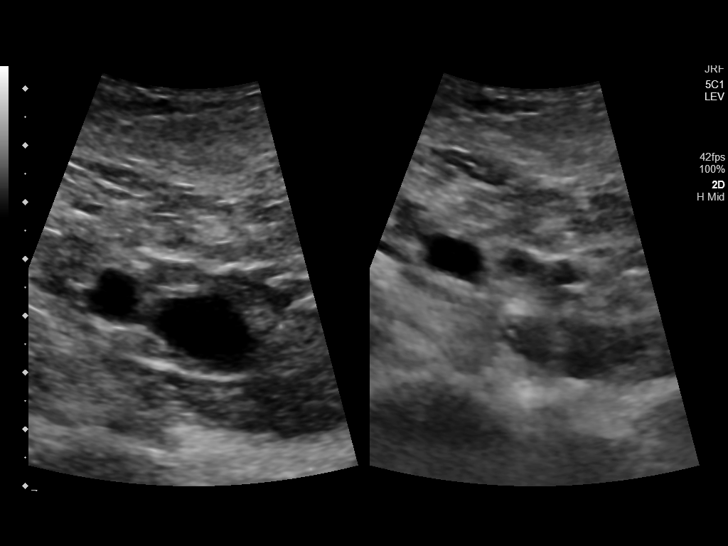
[im 35/58]
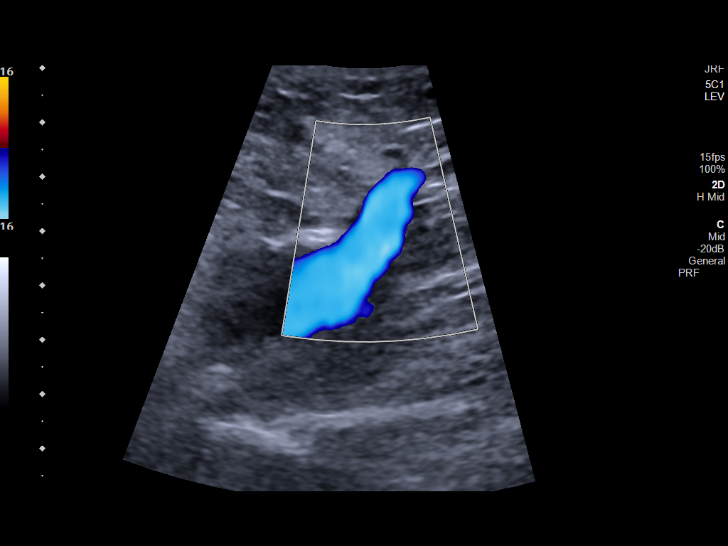
[im 40/58]
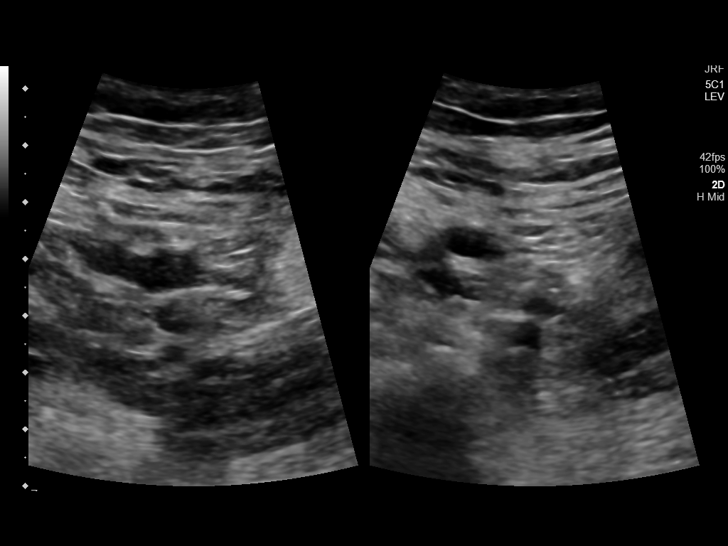
[im 45/58]
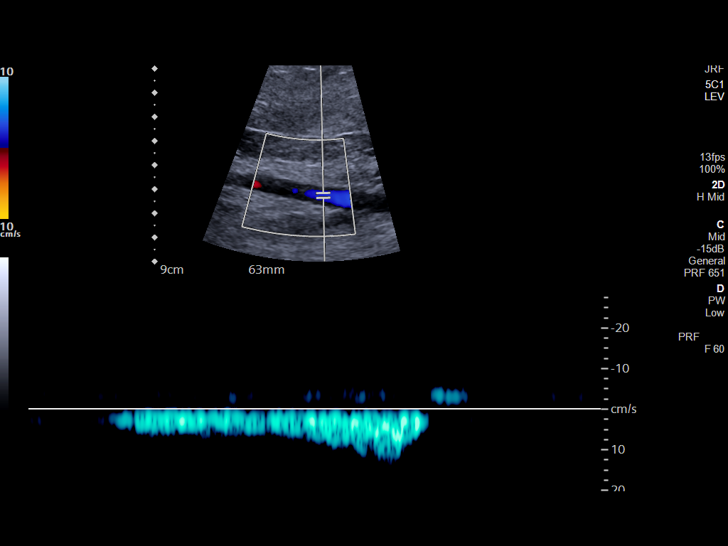
[im 48/58]
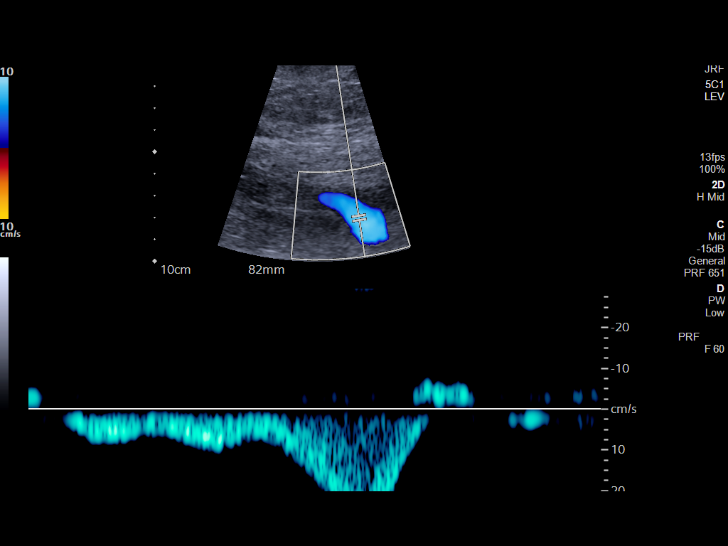
[im 53/58]
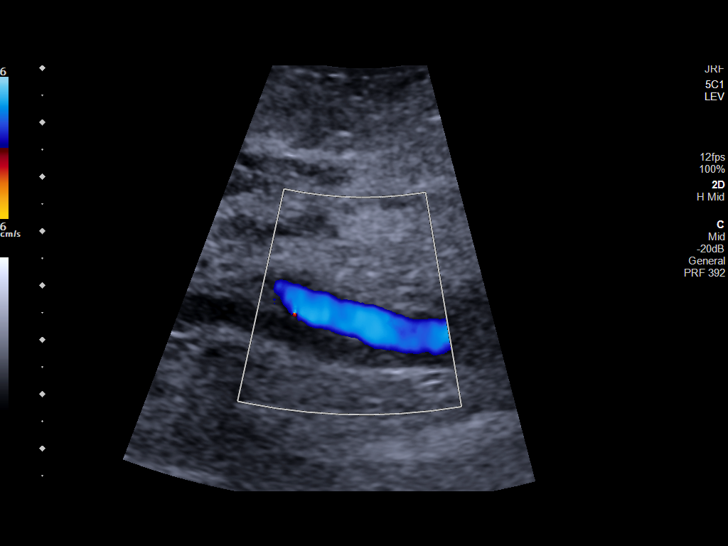
[im 58/58]
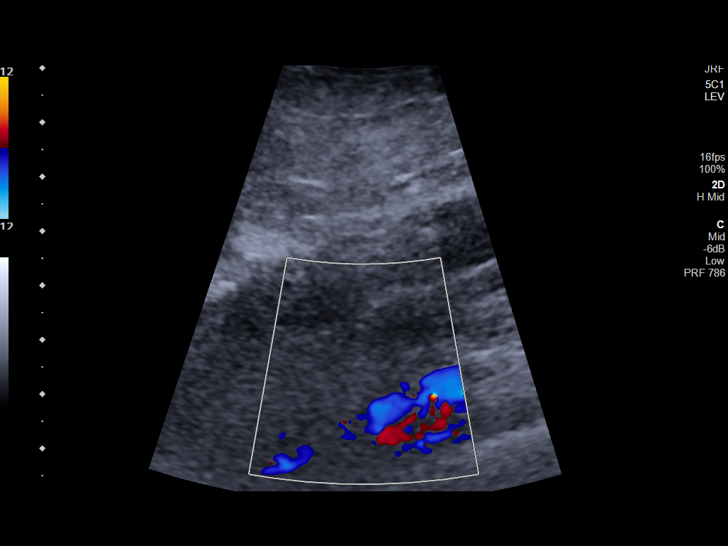

[14 of 24 positions shown; findings below may reference images not displayed]

FINDINGS: VENOUS

Normal compressibility of the common femoral, superficial femoral,
and popliteal veins, as well as the visualized calf veins.
Visualized portions of profunda femoral vein and great saphenous
vein unremarkable. No filling defects to suggest DVT on grayscale or
color Doppler imaging. Doppler waveforms show normal direction of
venous flow, normal respiratory plasticity and response to
augmentation.

OTHER

None.

Limitations: none
IMPRESSION: 1. No evidence of deep venous thrombosis within either lower
extremity.

## 2022-11-30 ENCOUNTER — Emergency Department: Payer: Medicare HMO

## 2022-11-30 ENCOUNTER — Other Ambulatory Visit: Payer: Self-pay

## 2022-11-30 ENCOUNTER — Inpatient Hospital Stay
Admission: EM | Admit: 2022-11-30 | Discharge: 2022-12-01 | DRG: 872 | Disposition: A | Discharge status: Other Acute Inpatient Hospital | Payer: Medicare HMO | Source: Skilled Nursing Facility | Attending: Internal Medicine | Admitting: Internal Medicine

## 2022-11-30 DIAGNOSIS — E039 Hypothyroidism, unspecified: Secondary | ICD-10-CM | POA: Diagnosis present

## 2022-11-30 DIAGNOSIS — Z885 Allergy status to narcotic agent status: Secondary | ICD-10-CM | POA: Diagnosis not present

## 2022-11-30 DIAGNOSIS — E785 Hyperlipidemia, unspecified: Secondary | ICD-10-CM | POA: Diagnosis present

## 2022-11-30 DIAGNOSIS — I4891 Unspecified atrial fibrillation: Secondary | ICD-10-CM | POA: Diagnosis present

## 2022-11-30 DIAGNOSIS — N390 Urinary tract infection, site not specified: Secondary | ICD-10-CM | POA: Diagnosis present

## 2022-11-30 DIAGNOSIS — Z7951 Long term (current) use of inhaled steroids: Secondary | ICD-10-CM | POA: Diagnosis not present

## 2022-11-30 DIAGNOSIS — I482 Chronic atrial fibrillation, unspecified: Secondary | ICD-10-CM | POA: Diagnosis present

## 2022-11-30 DIAGNOSIS — R17 Unspecified jaundice: Secondary | ICD-10-CM

## 2022-11-30 DIAGNOSIS — Z8719 Personal history of other diseases of the digestive system: Secondary | ICD-10-CM

## 2022-11-30 DIAGNOSIS — A419 Sepsis, unspecified organism: Secondary | ICD-10-CM | POA: Diagnosis present

## 2022-11-30 DIAGNOSIS — M1990 Unspecified osteoarthritis, unspecified site: Secondary | ICD-10-CM | POA: Diagnosis present

## 2022-11-30 DIAGNOSIS — I060 Rheumatic aortic stenosis: Secondary | ICD-10-CM | POA: Diagnosis present

## 2022-11-30 DIAGNOSIS — J449 Chronic obstructive pulmonary disease, unspecified: Secondary | ICD-10-CM | POA: Diagnosis present

## 2022-11-30 DIAGNOSIS — I5032 Chronic diastolic (congestive) heart failure: Secondary | ICD-10-CM | POA: Diagnosis present

## 2022-11-30 DIAGNOSIS — I110 Hypertensive heart disease with heart failure: Secondary | ICD-10-CM | POA: Diagnosis present

## 2022-11-30 DIAGNOSIS — I10 Essential (primary) hypertension: Secondary | ICD-10-CM | POA: Diagnosis present

## 2022-11-30 DIAGNOSIS — Z806 Family history of leukemia: Secondary | ICD-10-CM

## 2022-11-30 DIAGNOSIS — Z7901 Long term (current) use of anticoagulants: Secondary | ICD-10-CM | POA: Diagnosis not present

## 2022-11-30 DIAGNOSIS — Z79890 Hormone replacement therapy (postmenopausal): Secondary | ICD-10-CM

## 2022-11-30 DIAGNOSIS — Z808 Family history of malignant neoplasm of other organs or systems: Secondary | ICD-10-CM

## 2022-11-30 DIAGNOSIS — Z6841 Body Mass Index (BMI) 40.0 and over, adult: Secondary | ICD-10-CM

## 2022-11-30 DIAGNOSIS — J4489 Other specified chronic obstructive pulmonary disease: Secondary | ICD-10-CM | POA: Diagnosis present

## 2022-11-30 DIAGNOSIS — J45909 Unspecified asthma, uncomplicated: Secondary | ICD-10-CM | POA: Diagnosis present

## 2022-11-30 DIAGNOSIS — Z79899 Other long term (current) drug therapy: Secondary | ICD-10-CM | POA: Diagnosis not present

## 2022-11-30 DIAGNOSIS — R6520 Severe sepsis without septic shock: Secondary | ICD-10-CM | POA: Diagnosis present

## 2022-11-30 DIAGNOSIS — Z90710 Acquired absence of both cervix and uterus: Secondary | ICD-10-CM

## 2022-11-30 DIAGNOSIS — K8050 Calculus of bile duct without cholangitis or cholecystitis without obstruction: Secondary | ICD-10-CM | POA: Diagnosis not present

## 2022-11-30 DIAGNOSIS — K219 Gastro-esophageal reflux disease without esophagitis: Secondary | ICD-10-CM | POA: Diagnosis present

## 2022-11-30 DIAGNOSIS — A4151 Sepsis due to Escherichia coli [E. coli]: Principal | ICD-10-CM | POA: Diagnosis present

## 2022-11-30 DIAGNOSIS — I480 Paroxysmal atrial fibrillation: Secondary | ICD-10-CM | POA: Diagnosis present

## 2022-11-30 DIAGNOSIS — R1084 Generalized abdominal pain: Secondary | ICD-10-CM | POA: Diagnosis not present

## 2022-11-30 DIAGNOSIS — G4733 Obstructive sleep apnea (adult) (pediatric): Secondary | ICD-10-CM | POA: Diagnosis present

## 2022-11-30 DIAGNOSIS — E872 Acidosis, unspecified: Secondary | ICD-10-CM | POA: Diagnosis present

## 2022-11-30 DIAGNOSIS — Z9049 Acquired absence of other specified parts of digestive tract: Secondary | ICD-10-CM

## 2022-11-30 DIAGNOSIS — R109 Unspecified abdominal pain: Secondary | ICD-10-CM | POA: Diagnosis present

## 2022-11-30 DIAGNOSIS — K8030 Calculus of bile duct with cholangitis, unspecified, without obstruction: Secondary | ICD-10-CM | POA: Diagnosis present

## 2022-11-30 DIAGNOSIS — Z820 Family history of epilepsy and other diseases of the nervous system: Secondary | ICD-10-CM

## 2022-11-30 DIAGNOSIS — R1013 Epigastric pain: Secondary | ICD-10-CM | POA: Diagnosis not present

## 2022-11-30 LAB — HEMOGLOBIN A1C
Hgb A1c MFr Bld: 4.9 % (ref 4.8–5.6)
Mean Plasma Glucose: 93.93 mg/dL

## 2022-11-30 LAB — COMPREHENSIVE METABOLIC PANEL
ALT: 45 U/L — ABNORMAL HIGH (ref 0–44)
AST: 76 U/L — ABNORMAL HIGH (ref 15–41)
Albumin: 2.4 g/dL — ABNORMAL LOW (ref 3.5–5.0)
Alkaline Phosphatase: 398 U/L — ABNORMAL HIGH (ref 38–126)
Anion gap: 12 (ref 5–15)
BUN: 14 mg/dL (ref 8–23)
CO2: 24 mmol/L (ref 22–32)
Calcium: 8.7 mg/dL — ABNORMAL LOW (ref 8.9–10.3)
Chloride: 94 mmol/L — ABNORMAL LOW (ref 98–111)
Creatinine, Ser: 0.48 mg/dL (ref 0.44–1.00)
GFR, Estimated: >60 mL/min (ref >60)
Glucose, Bld: 94 mg/dL (ref 70–99)
Potassium: 3.9 mmol/L (ref 3.5–5.1)
Sodium: 130 mmol/L — ABNORMAL LOW (ref 135–145)
Total Bilirubin: 8.1 mg/dL — ABNORMAL HIGH (ref 0.3–1.2)
Total Protein: 7.1 g/dL (ref 6.5–8.1)

## 2022-11-30 LAB — CBC
HCT: 37.6 % (ref 36.0–46.0)
Hemoglobin: 13 g/dL (ref 12.0–15.0)
MCH: 30.4 pg (ref 26.0–34.0)
MCHC: 34.6 g/dL (ref 30.0–36.0)
MCV: 88.1 fL (ref 80.0–100.0)
Platelets: 338 10*3/uL (ref 150–400)
RBC: 4.27 MIL/uL (ref 3.87–5.11)
RDW: 15.4 % (ref 11.5–15.5)
WBC: 13.5 10*3/uL — ABNORMAL HIGH (ref 4.0–10.5)
nRBC: 0 % (ref 0.0–0.2)

## 2022-11-30 LAB — URINALYSIS, ROUTINE W REFLEX MICROSCOPIC
Glucose, UA: NEGATIVE mg/dL
Ketones, ur: NEGATIVE mg/dL
Nitrite: POSITIVE — AB
Protein, ur: NEGATIVE mg/dL
Specific Gravity, Urine: >1.046 — ABNORMAL HIGH (ref 1.005–1.030)
pH: 5 (ref 5.0–8.0)

## 2022-11-30 LAB — BILIRUBIN, DIRECT: Bilirubin, Direct: 4.3 mg/dL — ABNORMAL HIGH (ref 0.0–0.2)

## 2022-11-30 LAB — LIPASE, BLOOD: Lipase: 22 U/L (ref 11–51)

## 2022-11-30 LAB — LACTIC ACID, PLASMA
Lactic Acid, Venous: 2.2 mmol/L (ref 0.5–1.9)
Lactic Acid, Venous: 1.2 mmol/L (ref 0.5–1.9)

## 2022-11-30 LAB — PROTIME-INR
INR: 1.8 — ABNORMAL HIGH (ref 0.8–1.2)
Prothrombin Time: 20.8 seconds — ABNORMAL HIGH (ref 11.4–15.2)

## 2022-11-30 LAB — GAMMA GT: GGT: 224 U/L — ABNORMAL HIGH (ref 7–50)

## 2022-11-30 MED ORDER — SODIUM CHLORIDE 0.9 % IV BOLUS
1000.0000 mL | Freq: Once | INTRAVENOUS | Status: AC
  Administered 2022-11-30: 1000 mL via INTRAVENOUS

## 2022-11-30 MED ORDER — SODIUM CHLORIDE 0.9 % IV SOLN: INTRAVENOUS | Status: AC

## 2022-11-30 MED ORDER — ACETAMINOPHEN 500 MG PO TABS: 500.0000 mg | ORAL_TABLET | Freq: Four times a day (QID) | ORAL | Status: DC | PRN

## 2022-11-30 MED ORDER — PIPERACILLIN-TAZOBACTAM 3.375 G IVPB: 3.3750 g | Freq: Three times a day (TID) | INTRAVENOUS | Status: DC

## 2022-11-30 MED ORDER — LEVOTHYROXINE SODIUM 25 MCG PO TABS: 125.0000 ug | ORAL_TABLET | Freq: Every day | ORAL | Status: DC

## 2022-11-30 MED ORDER — APIXABAN 5 MG PO TABS: 5.0000 mg | ORAL_TABLET | Freq: Two times a day (BID) | ORAL | Status: DC

## 2022-11-30 MED ORDER — HYDROCORTISONE SOD SUC (PF) 100 MG IJ SOLR
100.0000 mg | Freq: Two times a day (BID) | INTRAMUSCULAR | Status: DC
  Administered 2022-12-01 (×2): 100 mg via INTRAVENOUS
  Filled 2022-11-30 (×3): qty 2

## 2022-11-30 MED ORDER — THIAMINE MONONITRATE 100 MG PO TABS
100.0000 mg | ORAL_TABLET | Freq: Every day | ORAL | Status: DC
  Filled 2022-11-30: qty 1

## 2022-11-30 MED ORDER — SODIUM CHLORIDE 0.9 % IV BOLUS
500.0000 mL | Freq: Once | INTRAVENOUS | Status: AC
  Administered 2022-11-30: 500 mL via INTRAVENOUS

## 2022-11-30 MED ORDER — ATORVASTATIN CALCIUM 20 MG PO TABS
10.0000 mg | ORAL_TABLET | Freq: Every day | ORAL | Status: DC
  Filled 2022-11-30: qty 1

## 2022-11-30 MED ORDER — METRONIDAZOLE 500 MG/100ML IV SOLN: 500.0000 mg | Freq: Two times a day (BID) | INTRAVENOUS | Status: DC

## 2022-11-30 MED ORDER — ALBUTEROL SULFATE (2.5 MG/3ML) 0.083% IN NEBU: 2.5000 mg | INHALATION_SOLUTION | Freq: Four times a day (QID) | RESPIRATORY_TRACT | Status: DC | PRN

## 2022-11-30 MED ORDER — PIPERACILLIN-TAZOBACTAM 3.375 G IVPB
3.3750 g | Freq: Once | INTRAVENOUS | Status: AC
  Administered 2022-11-30: 3.375 g via INTRAVENOUS
  Filled 2022-11-30: qty 50

## 2022-11-30 MED ORDER — IPRATROPIUM-ALBUTEROL 20-100 MCG/ACT IN AERS: 1.0000 | INHALATION_SPRAY | Freq: Every day | RESPIRATORY_TRACT | Status: DC

## 2022-11-30 MED ORDER — IPRATROPIUM-ALBUTEROL 0.5-2.5 (3) MG/3ML IN SOLN
3.0000 mL | Freq: Two times a day (BID) | RESPIRATORY_TRACT | Status: DC
  Administered 2022-12-01 (×2): 3 mL via RESPIRATORY_TRACT
  Filled 2022-11-30 (×2): qty 3

## 2022-11-30 MED ORDER — SODIUM CHLORIDE 0.9% FLUSH
3.0000 mL | Freq: Two times a day (BID) | INTRAVENOUS | Status: DC
  Administered 2022-12-01 (×3): 3 mL via INTRAVENOUS

## 2022-11-30 MED ORDER — IOHEXOL 300 MG/ML  SOLN
100.0000 mL | Freq: Once | INTRAMUSCULAR | Status: AC | PRN
  Administered 2022-11-30: 100 mL via INTRAVENOUS

## 2022-11-30 MED ORDER — RISAQUAD PO CAPS
1.0000 | ORAL_CAPSULE | Freq: Every day | ORAL | Status: DC
  Filled 2022-11-30: qty 1

## 2022-11-30 NOTE — ED Provider Notes (Signed)
Patient care assumed at 3 PM.  Briefly, patient is an 82 year old female here with abnormal labs.  Patient has significantly elevated bilirubin, mild leukocytosis.  She has had general fatigue and abdominal discomfort.  CT scan is pending.  Plan to likely admit following CT. CT scan shows small amount of pneumobilia.  Common bile duct is 9 mm.  This is within normal limits for age.  No evidence of intrahepatic biliary dilatation.  Discussed the case initially with Dr. Timothy Lasso of GI who was unsure whether patient might need ERCP.    I called and discussed the case with GI specialist at [facility] who reviewed the imaging and labs, does not feel that patient needs ERCP or would be a candidate at this time.  Recommends treatment for sepsis, possibly secondary to other source, and trending labs with GI consult here.  Given her age and possible alternative source of UTI as well as pneumobilia which would suggest that there is drainage there with no intrahepatic dilatation, I think this is reasonable.  Patient given IV fluids for possible sepsis.  Blood blood cultures were sent.  IV Zosyn started.  .Critical Care  Performed by: Shaune Pollack, MD Authorized by: Shaune Pollack, MD   Critical care provider statement:    Critical care time (minutes):  30   Critical care was necessary to treat or prevent imminent or life-threatening deterioration of the following conditions:  Circulatory failure, respiratory failure, cardiac failure and sepsis   Critical care was time spent personally by me on the following activities:  Development of treatment plan with patient or surrogate, discussions with consultants, evaluation of patient's response to treatment, examination of patient, ordering and review of laboratory studies, ordering and review of radiographic studies, ordering and performing treatments and interventions, pulse oximetry, re-evaluation of patient's condition and review of old charts     Shaune Pollack, MD 11/30/22 2125

## 2022-11-30 NOTE — Progress Notes (Signed)
PHARMACY -  BRIEF ANTIBIOTIC NOTE   Pharmacy has received consult(s) for Zosyn from an ED provider.  The patient's profile has been reviewed for ht/wt/allergies/indication/available labs.    One time order(s) placed for Zosyn 3.375 grams x 1  Further antibiotics/pharmacy consults should be ordered by admitting physician if indicated.                       Thank you, Elliot Gurney, PharmD, BCPS Clinical Pharmacist  11/30/2022 6:14 PM

## 2022-11-30 NOTE — ED Triage Notes (Signed)
Patient to come from [facility] for abnormal labs and abnormal xray. Patient labs and xr are located in the chart. Patient states the last two months she has had issues with abdominal pain, but currently not having pain.   Denies n/v and had diarrhea last night.

## 2022-11-30 NOTE — H&P (Incomplete)
History and Physical    Patient: [name] BMW:[id number] DOB: [birth year] DOA: 11/30/2022 DOS: the patient was seen and examined on 11/30/2022 PCP: Pcp, No  Patient coming from:  [facility] for abnormal labs and abnormal x-ray   Chief Complaint:  Chief Complaint  Patient presents with  . Abdominal Pain  . abnormal labs    HPI: [name] is a 82 y.o. female with medical history significant for asthma, COPD,  CHF, A,fib, GERD, hypertension, hypothyroidism, morbid obesity presenting for abdominal pain and abnormal blood work.  Patient notes that the last 2 months she has had issues with abdominal pain but no nausea and vomiting but does report diarrhea last night. Pt has labs showing wbc of 27[address] and t.bili of 8, KUB done shows ileus.  Urinalysis today is clear with small bili small hemoglobin positive nitrite, WBC of 11-20 with cultures collected in the emergency room.  Blood culture done in the emergency room. Ct imaging shows: IMPRESSION: 1. No CT evidence for acute intra-abdominal or pelvic abnormality. 2. Status post cholecystectomy. Small volume pneumobilia likely related to prior sphincterotomy. Common bile duct diameter up to 9 mm. 3. 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 4. Cardiomegaly. Dense aortic valvular calcification. 5. Prior ventral hernia repair 6. Aortic atherosclerosis. Aortic Atherosclerosis (ICD10-I70.0).  Review of Systems: Review of Systems  Gastrointestinal:  Positive for abdominal pain and diarrhea.  Neurological:  Positive for weakness.   Past Medical History:  Diagnosis Date  . Asthma   . COPD (chronic obstructive pulmonary disease) (HCC)   . Depression   . GERD (gastroesophageal reflux disease)   . Hyperlipidemia   . Hypertension   . Hypothyroidism   . Morbid obesity (HCC)   . Osteoarthritis   . PMB (postmenopausal bleeding)   . Sleep apnea   . Vaginal atrophy    Past Surgical History:   Procedure Laterality Date  . ABDOMINAL HYSTERECTOMY  2014   [facility]  . CHOLECYSTECTOMY    . DILATION AND CURETTAGE OF UTERUS    . UMBILICAL HERNIA REPAIR     Social History:   reports that she has never smoked. She has never used smokeless tobacco. She reports that she does not drink alcohol and does not use drugs.  Allergies  Allergen Reactions  . Morphine And Codeine   . Vicodin [Hydrocodone-Acetaminophen] Other (See Comments)    Reaction:  Raises pts BP     Family History  Problem Relation Age of Onset  . Alzheimer's disease Mother   . Leukemia Mother   . Brain cancer Brother   . Heart disease Neg Hx   . Diabetes Neg Hx   . Breast cancer Neg Hx   . Ovarian cancer Neg Hx     Prior to Admission medications   Medication Sig Start Date End Date Taking? Authorizing Provider  acetaminophen (TYLENOL) 500 MG tablet Take 500-1,000 mg by mouth every 6 (six) hours as needed for mild pain or fever.    Yes [provider]  acidophilus (RISAQUAD) CAPS capsule Take 1 capsule by mouth daily. 05/10/20  Yes Lynn Ito, MD  albuterol (PROVENTIL HFA;VENTOLIN HFA) 108 (90 BASE) MCG/ACT inhaler Inhale 2 puffs into the lungs every 6 (six) hours as needed for wheezing or shortness of breath.    Yes [provider]  apixaban (ELIQUIS) 5 MG TABS tablet Take 1 tablet (5 mg total) by mouth 2 (two) times daily. 05/10/20  Yes Lynn Ito, MD  atorvastatin (LIPITOR) 10  MG tablet Take 10 mg by mouth daily. 11/03/22  Yes [provider]  Calcium Carbonate-Vitamin D 600-400 MG-UNIT tablet Take 1 tablet by mouth daily after lunch.    Yes [provider]  clotrimazole-betamethasone (LOTRISONE) cream Apply 1 Application topically 2 (two) times daily. 11/28/22  Yes [provider]  COMBIVENT RESPIMAT 20-100 MCG/ACT AERS respimat Inhale 1 puff into the lungs daily. 11/15/22  Yes [provider]  fluticasone (FLONASE) 50 MCG/ACT nasal spray Place 1 spray into both  nostrils daily. 10/03/22  Yes [provider]  furosemide (LASIX) 20 MG tablet Take 1 tablet (20 mg total) by mouth daily. 05/10/20  Yes Lynn Ito, MD  ipratropium-albuterol (DUONEB) 0.5-2.5 (3) MG/3ML SOLN Take 3 mLs by nebulization 2 (two) times daily. 08/28/19  Yes Mikhail, Sheridan, DO  levothyroxine (SYNTHROID, LEVOTHROID) 125 MCG tablet Take 125 mcg by mouth daily before breakfast.   Yes [provider]  loratadine (CLARITIN) 10 MG tablet Take 10 mg by mouth daily.   Yes [provider]  NYSTATIN powder Apply 1 Application topically 3 (three) times daily. 11/24/22  Yes [provider]  pantoprazole (PROTONIX) 40 MG tablet Take 40 mg by mouth daily. 11/22/22  Yes [provider]  polyethylene glycol (MIRALAX / GLYCOLAX) 17 g packet Take 17 g by mouth daily. 05/10/20  Yes Lynn Ito, MD  potassium chloride SA (KLOR-CON M) 20 MEQ tablet Take 20 mEq by mouth daily. 11/10/22  Yes [provider]  senna (SENOKOT) 8.6 MG TABS tablet Take 2 tablets (17.2 mg total) by mouth daily. 05/10/20  Yes Lynn Ito, MD  SYMBICORT 160-4.5 MCG/ACT inhaler Inhale 2 puffs into the lungs 2 (two) times daily. 10/28/22  Yes [provider]  vitamin B-12 (CYANOCOBALAMIN) 1000 MCG tablet Take 1,000 mcg by mouth daily.   Yes [provider]   Vitals:   11/30/22 2200 11/30/22 2215 11/30/22 2230 11/30/22 2245  BP:   114/60   Pulse: 85 79 79 84  Resp:  18  18  Temp:      TempSrc:      SpO2: 98% 98% 99%   Weight:      Height:       Physical Exam Vitals and nursing note reviewed.  Constitutional:      General: She is not in acute distress. HENT:     Head: Normocephalic and atraumatic.     Right Ear: Hearing normal.     Left Ear: Hearing normal.     Nose: Nose normal. No nasal deformity.     Mouth/Throat:     Lips: Pink.     Tongue: No lesions.     Pharynx: Oropharynx is clear.  Eyes:     General: Lids are normal.     Extraocular Movements:  Extraocular movements intact.  Cardiovascular:     Rate and Rhythm: Normal rate and regular rhythm.     Heart sounds: Normal heart sounds.  Pulmonary:     Effort: Pulmonary effort is normal.     Breath sounds: Normal breath sounds.  Abdominal:     General: Bowel sounds are normal. There is distension.     Palpations: Abdomen is soft. There is no mass.     Tenderness: There is abdominal tenderness.  Musculoskeletal:     Right lower leg: No edema.     Left lower leg: No edema.  Skin:    General: Skin is warm.  Neurological:     General: No focal deficit present.  Mental Status: She is alert and oriented to person, place, and time.     Cranial Nerves: Cranial nerves 2-12 are intact.  Psychiatric:        Attention and Perception: Attention normal.        Mood and Affect: Mood normal.        Speech: Speech normal.        Behavior: Behavior normal. Behavior is cooperative.     Labs on Admission: I have personally reviewed following labs and imaging studies  CBC: Recent Labs  Lab 11/30/22 1136  WBC 13.5*  HGB 13.0  HCT 37.6  MCV 88.1  PLT 338   Basic Metabolic Panel: Recent Labs  Lab 11/30/22 1136  NA 130*  K 3.9  CL 94*  CO2 24  GLUCOSE 94  BUN 14  CREATININE 0.48  CALCIUM 8.7*   GFR: Estimated Creatinine Clearance: 72.8 mL/min (by C-G formula based on SCr of 0.48 mg/dL). Liver Function Tests: Recent Labs  Lab 11/30/22 1136  AST 76*  ALT 45*  ALKPHOS 398*  BILITOT 8.1*  PROT 7.1  ALBUMIN 2.4*   Recent Labs  Lab 11/30/22 1136  LIPASE 22   No results for input(s): "AMMONIA" in the last 168 hours. Coagulation Profile: Recent Labs  Lab 11/30/22 1523  INR 1.8*   Cardiac Enzymes: No results for input(s): "CKTOTAL", "CKMB", "CKMBINDEX", "TROPONINI" in the last 168 hours. BNP (last 3 results) No results for input(s): "PROBNP" in the last 8760 hours. HbA1C: Recent Labs    11/30/22 1136  HGBA1C 4.9   CBG: No results for input(s): "GLUCAP" in  the last 168 hours. Lipid Profile: No results for input(s): "CHOL", "HDL", "LDLCALC", "TRIG", "CHOLHDL", "LDLDIRECT" in the last 72 hours. Thyroid Function Tests: No results for input(s): "TSH", "T4TOTAL", "FREET4", "T3FREE", "THYROIDAB" in the last 72 hours. Anemia Panel: No results for input(s): "VITAMINB12", "FOLATE", "FERRITIN", "TIBC", "IRON", "RETICCTPCT" in the last 72 hours. Urinalysis    Component Value Date/Time   COLORURINE AMBER (A) 11/30/2022 1946   APPEARANCEUR CLEAR (A) 11/30/2022 1946   APPEARANCEUR Clear 10/12/2013 0500   LABSPEC >1.046 (H) 11/30/2022 1946   LABSPEC 1.003 10/12/2013 0500   PHURINE 5.0 11/30/2022 1946   GLUCOSEU NEGATIVE 11/30/2022 1946   GLUCOSEU Negative 10/12/2013 0500   GLUCOSEU NEGATIVE 04/25/2012 0949   HGBUR SMALL (A) 11/30/2022 1946   BILIRUBINUR SMALL (A) 11/30/2022 1946   BILIRUBINUR Negative 10/12/2013 0500   KETONESUR NEGATIVE 11/30/2022 1946   PROTEINUR NEGATIVE 11/30/2022 1946   UROBILINOGEN 0.2 04/25/2012 0949   NITRITE POSITIVE (A) 11/30/2022 1946   LEUKOCYTESUR TRACE (A) 11/30/2022 1946   LEUKOCYTESUR 1+ 10/12/2013 0500   Unresulted Labs (From admission, onward)     Start     Ordered   12/01/22 0500  Protime-INR  Tomorrow morning,   STAT        11/30/22 2324   12/01/22 0500  Cortisol-am, blood  Tomorrow morning,   URGENT        11/30/22 2324   11/30/22 1746  Blood culture (single)  Once,   STAT        11/30/22 1745           Medications  0.9 %  sodium chloride infusion ( Intravenous New Bag/Given 11/30/22 1840)  apixaban (ELIQUIS) tablet 5 mg (has no administration in time range)  acidophilus (RISAQUAD) capsule 1 capsule (has no administration in time range)  ipratropium-albuterol (DUONEB) 0.5-2.5 (3) MG/3ML nebulizer solution 3 mL (has no administration in time range)  atorvastatin (LIPITOR) tablet 10 mg (has no administration in time range)  albuterol (VENTOLIN HFA) 108 (90 Base) MCG/ACT inhaler 2 puff (has no  administration in time range)  acetaminophen (TYLENOL) tablet 500-1,000 mg (has no administration in time range)  Ipratropium-Albuterol (COMBIVENT) respimat 1 puff (has no administration in time range)  levothyroxine (SYNTHROID) tablet 125 mcg (has no administration in time range)  sodium chloride flush (NS) 0.9 % injection 3 mL (has no administration in time range)  metroNIDAZOLE (FLAGYL) IVPB 500 mg (has no administration in time range)  hydrocortisone sodium succinate (SOLU-CORTEF) 100 MG injection 100 mg (has no administration in time range)  thiamine (VITAMIN B1) tablet 100 mg (has no administration in time range)  iohexol (OMNIPAQUE) 300 MG/ML solution 100 mL (100 mLs Intravenous Contrast Given 11/30/22 1429)  sodium chloride 0.9 % bolus 1,000 mL (0 mLs Intravenous Stopped 11/30/22 2205)  piperacillin-tazobactam (ZOSYN) IVPB 3.375 g (0 g Intravenous Stopped 11/30/22 1920)  sodium chloride 0.9 % bolus 1,000 mL (0 mLs Intravenous Stopped 11/30/22 2205)  sodium chloride 0.9 % bolus 500 mL (0 mLs Intravenous Stopped 11/30/22 2241)    Radiological Exams on Admission: CT ABDOMEN PELVIS W CONTRAST  Result Date: 11/30/2022 CLINICAL DATA:  Right lower quadrant pain jaundice abnormal x-rays EXAM: CT ABDOMEN AND PELVIS WITH CONTRAST TECHNIQUE: Multidetector CT imaging of the abdomen and pelvis was performed using the standard protocol following bolus administration of intravenous contrast. RADIATION DOSE REDUCTION: This exam was performed according to the departmental dose-optimization program which includes automated exposure control, adjustment of the mA and/or kV according to patient size and/or use of iterative reconstruction technique. CONTRAST:  OMNIPAQUE IOHEXOL 300 MG/ML  SOLN COMPARISON:  CT 10/08/2015 FINDINGS: Lower chest: No acute airspace disease. Cardiomegaly. Coronary vascular calcification. Mitral annular calcification. Dense aortic valvular calcification. Hepatobiliary: Status post  cholecystectomy. Small volume pneumobilia likely related to prior sphincterotomy. No intra hepatic biliary dilatation. Common bile duct diameter up to 9 mm Pancreas: Unremarkable. No pancreatic ductal dilatation or surrounding inflammatory changes. Spleen: Normal in size without focal abnormality. Adrenals/Urinary Tract: Adrenal glands are within normal limits. Kidneys show no hydronephrosis. The bladder is unremarkable Stomach/Bowel: Stomach is nonenlarged. There is no dilated small bowel. No acute bowel wall thickening. 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 Vascular/Lymphatic: Moderate aortic atherosclerosis. No aneurysm. No suspicious lymph nodes. Reproductive: Hysterectomy.  No adnexal mass Other: Negative for pelvic effusion or free air. Musculoskeletal: Degenerative changes. No acute or suspicious osseous abnormality IMPRESSION: 1. No CT evidence for acute intra-abdominal or pelvic abnormality. 2. Status post cholecystectomy. Small volume pneumobilia likely related to prior sphincterotomy. Common bile duct diameter up to 9 mm. 3. 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 4. Cardiomegaly. Dense aortic valvular calcification. 5. Prior ventral hernia repair 6. Aortic atherosclerosis. Aortic Atherosclerosis (ICD10-I70.0). Electronically Signed   By: Jasmine Pang M.D.   On: 11/30/2022 17:11     Data Reviewed: Relevant notes from primary care and specialist visits, past discharge summaries as available in EHR, including Care Everywhere. Prior diagnostic testing as pertinent to current admission diagnoses Updated medications and problem lists for reconciliation ED course, including vitals, labs, imaging, treatment and response to treatment Triage notes, nursing and pharmacy notes and ED provider's notes           Prognosis: ***  DVT prophylaxis:  ***  Consults:  ***  Advance Care Planning:    Code Status: Prior   Family  Communication:  ***  Disposition Plan:  ***  Severity of Illness: {Observation/Inpatient:21159}  Author: Gertha Calkin, MD 11/30/2022 11:24 PM  For on call review [email or web address].

## 2022-11-30 NOTE — ED Notes (Signed)
Taking over care of pt at this time

## 2022-11-30 NOTE — ED Notes (Signed)
Report given to ICU

## 2022-11-30 NOTE — ED Notes (Signed)
Pt cleaned of incontinence

## 2022-11-30 NOTE — ED Provider Notes (Signed)
[facility] Provider Note    Event Date/Time   First MD Initiated Contact with Patient 11/30/22 1335     (approximate)   History   Chief Complaint: Abdominal Pain and abnormal labs   HPI  [name] is a 82 y.o. female with a history of asthma, morbid obesity, CHF, atrial fibrillation, diastolic heart failure who is sent to the ED due to abnormal labs.  Patient had outpatient labs and abdominal x-ray performed from [facility] which showed leukocytosis of 25,000, T. bili of 8, KUB concerning for ileus.  Patient denies pain currently but just states that she does not feel well.  Does endorse some diarrhea recently, no vomiting.     Physical Exam   Triage Vital Signs: ED Triage Vitals  Encounter Vitals Group     BP 11/30/22 1132 113/71     Systolic BP Percentile --      Diastolic BP Percentile --      Pulse Rate 11/30/22 1132 (!) 101     Resp 11/30/22 1132 16     Temp 11/30/22 1132 97.6 F (36.4 C)     Temp Source 11/30/22 1132 Oral     SpO2 11/30/22 1132 96 %     Weight 11/30/22 1134 280 lb (127 kg)     Height 11/30/22 1134 5\' 5"  (1.651 m)     Head Circumference --      [facility] --      Pain Score 11/30/22 1135 0     Pain Loc --      Pain Education --      Exclude from Growth Chart --     Most recent vital signs: Vitals:   11/30/22 1132 11/30/22 1519  BP: 113/71 105/77  Pulse: (!) 101 90  Resp: 16 18  Temp: 97.6 F (36.4 C) (!) 97.4 F (36.3 C)  SpO2: 96% 98%    General: Awake, no distress.  Jaundiced CV:  Good peripheral perfusion.  Dry oral mucosa Resp:  Normal effort.  Clear to auscultation bilaterally Abd:  No distention.  Soft with mild right lower quadrant tenderness Other:  Bilateral lower extremity lymphedema.   ED Results / Procedures / Treatments   Labs (all labs ordered are listed, but only abnormal results are displayed) Labs Reviewed  COMPREHENSIVE METABOLIC PANEL - Abnormal; Notable for the following  components:      Result Value   Sodium 130 (*)    Chloride 94 (*)    Calcium 8.7 (*)    Albumin 2.4 (*)    AST 76 (*)    ALT 45 (*)    Alkaline Phosphatase 398 (*)    Total Bilirubin 8.1 (*)    All other components within normal limits  CBC - Abnormal; Notable for the following components:   WBC 13.5 (*)    All other components within normal limits  BILIRUBIN, DIRECT - Abnormal; Notable for the following components:   Bilirubin, Direct 4.3 (*)    All other components within normal limits  LIPASE, BLOOD  URINALYSIS, ROUTINE W REFLEX MICROSCOPIC  PROTIME-INR     EKG    RADIOLOGY Ct Abd/pelvis   PROCEDURES:  Procedures   MEDICATIONS ORDERED IN ED: Medications  iohexol (OMNIPAQUE) 300 MG/ML solution 100 mL (100 mLs Intravenous Contrast Given 11/30/22 1429)     IMPRESSION / MDM / ASSESSMENT AND PLAN / ED COURSE  I reviewed the triage vital signs and the nursing notes.  DDx: IVC thrombosis, biliary  obstruction, pancreatic mass, dehydration, bowel obstruction/ileus  Patient's presentation is most consistent with acute presentation with potential threat to life or bodily function.  Patient presents with intermittent abdominal pain over the past few months along with jaundice and fatigue.  Exam is currently benign except for some mild right lower quadrant tenderness.  Will obtain CT abd/pelvis.  Labs today show T. bili of 8 with relatively normal transaminases, not consistent with acute viral hepatitis or Tylenol toxicity.  Will obtain fractionated bili.       FINAL CLINICAL IMPRESSION(S) / ED DIAGNOSES   Final diagnoses:  Abdominal pain, unspecified abdominal location  Jaundice  Morbid obesity (HCC)     Rx / DC Orders   ED Discharge Orders     None        Note:  This document was prepared using Dragon voice recognition software and may include unintentional dictation errors.   Sharman Cheek, MD 11/30/22 [phone]

## 2022-11-30 NOTE — Progress Notes (Signed)
Pharmacy Antibiotic Note  [name] is a 82 y.o. female admitted on 11/30/2022 with  intra-abdominal infection (possible cholangitis) .  Pharmacy has been consulted for Zosyn dosing.  Plan: Zosyn 3.375 gm IV x 1 given over 30 min in ED on 9/26 @ 1831. Zosyn 3.375 gm IV Q8H EI ordered to start on 9/27 @ 0030.  Height: 5\' 5"  (165.1 cm) Weight: 127 kg (280 lb) IBW/kg (Calculated) : 57  Temp (24hrs), Avg:97.6 F (36.4 C), Min:97.4 F (36.3 C), Max:97.8 F (36.6 C)  Recent Labs  Lab 11/30/22 1136 11/30/22 1946 11/30/22 2243  WBC 13.5*  --   --   CREATININE 0.48  --   --   LATICACIDVEN  --  2.2* 1.2    Estimated Creatinine Clearance: 72.8 mL/min (by C-G formula based on SCr of 0.48 mg/dL).    Allergies  Allergen Reactions   Morphine And Codeine    Vicodin [Hydrocodone-Acetaminophen] Other (See Comments)    Reaction:  Raises pts BP     Antimicrobials this admission:   >>    >>   Dose adjustments this admission:   Microbiology results:  BCx:   UCx:    Sputum:    MRSA PCR:   Thank you for allowing pharmacy to be a part of this patient's care.  Navarre Diana D 11/30/2022 11:36 PM

## 2022-12-01 ENCOUNTER — Encounter (HOSPITAL_COMMUNITY): Payer: Self-pay

## 2022-12-01 ENCOUNTER — Inpatient Hospital Stay: Payer: Medicare HMO

## 2022-12-01 DIAGNOSIS — A419 Sepsis, unspecified organism: Secondary | ICD-10-CM

## 2022-12-01 DIAGNOSIS — N390 Urinary tract infection, site not specified: Secondary | ICD-10-CM

## 2022-12-01 DIAGNOSIS — K8050 Calculus of bile duct without cholangitis or cholecystitis without obstruction: Secondary | ICD-10-CM | POA: Diagnosis not present

## 2022-12-01 DIAGNOSIS — R1013 Epigastric pain: Secondary | ICD-10-CM

## 2022-12-01 LAB — GASTROINTESTINAL PANEL BY PCR, STOOL (REPLACES STOOL CULTURE)

## 2022-12-01 LAB — CBC
HCT: 37.5 % (ref 36.0–46.0)
Hemoglobin: 12.7 g/dL (ref 12.0–15.0)
MCH: 30.5 pg (ref 26.0–34.0)
MCHC: 33.9 g/dL (ref 30.0–36.0)
MCV: 90.1 fL (ref 80.0–100.0)
Platelets: 335 10*3/uL (ref 150–400)
RBC: 4.16 MIL/uL (ref 3.87–5.11)
RDW: 15.4 % (ref 11.5–15.5)
WBC: 11.1 10*3/uL — ABNORMAL HIGH (ref 4.0–10.5)
nRBC: 0 % (ref 0.0–0.2)

## 2022-12-01 LAB — HEPATITIS PANEL, ACUTE
HCV Ab: NONREACTIVE
Hep A IgM: NONREACTIVE
Hep B C IgM: NONREACTIVE
Hepatitis B Surface Ag: NONREACTIVE

## 2022-12-01 LAB — COMPREHENSIVE METABOLIC PANEL
ALT: 36 U/L (ref 0–44)
AST: 53 U/L — ABNORMAL HIGH (ref 15–41)
Albumin: 2.3 g/dL — ABNORMAL LOW (ref 3.5–5.0)
Alkaline Phosphatase: 375 U/L — ABNORMAL HIGH (ref 38–126)
Anion gap: 9 (ref 5–15)
BUN: 14 mg/dL (ref 8–23)
CO2: 24 mmol/L (ref 22–32)
Calcium: 8.5 mg/dL — ABNORMAL LOW (ref 8.9–10.3)
Chloride: 103 mmol/L (ref 98–111)
Creatinine, Ser: 0.72 mg/dL (ref 0.44–1.00)
GFR, Estimated: >60 mL/min (ref >60)
Glucose, Bld: 116 mg/dL — ABNORMAL HIGH (ref 70–99)
Potassium: 4.2 mmol/L (ref 3.5–5.1)
Sodium: 136 mmol/L (ref 135–145)
Total Bilirubin: 5.6 mg/dL — ABNORMAL HIGH (ref 0.3–1.2)
Total Protein: 6.8 g/dL (ref 6.5–8.1)

## 2022-12-01 LAB — CORTISOL-AM, BLOOD: Cortisol - AM: >100 ug/dL — ABNORMAL HIGH (ref 6.7–22.6)

## 2022-12-01 LAB — FERRITIN: Ferritin: 473 ng/mL — ABNORMAL HIGH (ref 11–307)

## 2022-12-01 LAB — PROTIME-INR
INR: 1.6 — ABNORMAL HIGH (ref 0.8–1.2)
Prothrombin Time: 19.5 s — ABNORMAL HIGH (ref 11.4–15.2)

## 2022-12-01 LAB — MRSA NEXT GEN BY PCR, NASAL: MRSA by PCR Next Gen: DETECTED — AB

## 2022-12-01 LAB — C DIFFICILE QUICK SCREEN W PCR REFLEX
C Diff antigen: NEGATIVE
C Diff interpretation: NOT DETECTED
C Diff toxin: NEGATIVE

## 2022-12-01 LAB — GLUCOSE, CAPILLARY
Glucose-Capillary: 100 mg/dL — ABNORMAL HIGH (ref 70–99)
Glucose-Capillary: 126 mg/dL — ABNORMAL HIGH (ref 70–99)
Glucose-Capillary: 67 mg/dL — ABNORMAL LOW (ref 70–99)

## 2022-12-01 MED ORDER — CHLORHEXIDINE GLUCONATE CLOTH 2 % EX PADS
6.0000 | MEDICATED_PAD | Freq: Every day | CUTANEOUS | Status: DC
  Administered 2022-12-01: 6 via TOPICAL

## 2022-12-01 MED ORDER — NYSTATIN 100000 UNIT/GM EX CREA
TOPICAL_CREAM | Freq: Two times a day (BID) | CUTANEOUS | Status: DC
  Administered 2022-12-01: 1 via TOPICAL
  Filled 2022-12-01: qty 30

## 2022-12-01 MED ORDER — POLYVINYL ALCOHOL 1.4 % OP SOLN: 1.0000 [drp] | OPHTHALMIC | Status: DC | PRN

## 2022-12-01 MED ORDER — PIPERACILLIN-TAZOBACTAM 3.375 G IVPB
3.3750 g | Freq: Three times a day (TID) | INTRAVENOUS | Status: DC
  Administered 2022-12-01 (×3): 3.375 g via INTRAVENOUS
  Filled 2022-12-01 (×3): qty 50

## 2022-12-01 MED ORDER — GADOBUTROL 1 MMOL/ML IV SOLN
10.0000 mL | Freq: Once | INTRAVENOUS | Status: AC | PRN
  Administered 2022-12-01: 10 mL via INTRAVENOUS

## 2022-12-01 MED ORDER — PANTOPRAZOLE SODIUM 40 MG IV SOLR
40.0000 mg | Freq: Two times a day (BID) | INTRAVENOUS | Status: DC
  Administered 2022-12-01 (×2): 40 mg via INTRAVENOUS
  Filled 2022-12-01 (×2): qty 10

## 2022-12-01 MED ORDER — DEXTROSE 50 % IV SOLN: 12.5000 g | INTRAVENOUS | Status: AC

## 2022-12-01 MED ORDER — ORAL CARE MOUTH RINSE: 15.0000 mL | OROMUCOSAL | Status: DC | PRN

## 2022-12-01 MED ORDER — MUPIROCIN 2 % EX OINT
TOPICAL_OINTMENT | Freq: Two times a day (BID) | CUTANEOUS | Status: DC
  Administered 2022-12-01: 1 via NASAL
  Filled 2022-12-01: qty 22

## 2022-12-01 MED ORDER — DEXTROSE 50 % IV SOLN
INTRAVENOUS | Status: AC
  Administered 2022-12-01: 12.5 g via INTRAVENOUS
  Filled 2022-12-01: qty 50

## 2022-12-01 NOTE — Progress Notes (Signed)
   12/01/22 1100  Spiritual Encounters  Type of Visit Initial  Care provided to: Patient  Conversation partners present during encounter Nurse  Referral source Chaplain assessment  Reason for visit Routine spiritual support  OnCall Visit Yes  Spiritual Framework  Presenting Themes Meaning/purpose/sources of inspiration;Goals in life/care;Values and beliefs;Significant life change;Rituals and practive  Community/Connection Family  Patient Stress Factors Major life changes  Family Stress Factors Not reviewed  Interventions  Spiritual Care Interventions Made Established relationship of care and support;Compassionate presence;Reflective listening;Normalization of emotions;Explored ethical dilemma;Prayer  Intervention Outcomes  Outcomes Reduced fear;Reduced anxiety;Awareness of support;Connection to spiritual care  Spiritual Care Plan  Spiritual Care Issues Still Outstanding Chaplain will continue to follow   Talk a while with the patient about spiritual formation and how they are doing. The patient is a [place] but has been dealing with medical issues. Including her family and she has some concerns about her family and the medical issues they are having. Patient prayer for me after I prayer for her and her family.

## 2022-12-01 NOTE — Consult Note (Addendum)
GI Inpatient Consult Note  Reason for Consult: Abdominal pain, elevated LFTs and GGT   Attending Requesting Consult: Dr. Irena Cords  History of Present Illness: Kacie Huxtable is a 82 y.o. female seen for evaluation of Abdominal pain, elevated LFTs and GGT at the request of Dr. Irena Cords. Patient has a PMH of asthma, COPD, chronic diastolic CHF (LVEF 60-65%),  severe aortic stenosis, chronic atrial fibrillation on Eliquis, hypertension, hyperlipidemia, hypothyroidism, morbid obesity, depression/anxiety.  Patient presented to the [facility] ED yesterday form [facility] for abnormal labs and KUB concerning for ileus.  She has had  a two month history of poor appetite, intermittent upper abdominal sever cramping pain after eating, weight loss, jaundice and fatigue. Upon presentation to the ED, vital signs were 113/71,101, 16, 97.6, SpO2 96%. Labs were significant for AST 76, ALT 45, ALKP 398, TB 8.1, Dir bili 4.3, GGT 224, WBC 13.5, Hgb 13, Pt 20.8, INR 1.8, A1c 4.9, UA pos nitrite, WBC 11-20. Urine, blood and stool cultures pending.  Imaging studies revealed no CT evidence for acute intra-abdominal or pelvic abnormality. Status post cholecystectomy. Small volume pneumobilia likely related to prior sphincterotomy. Common bile duct diameter up to 9 mm. 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  Patient reports that she has been living at [facility] over the last 2 years.  She has never liked the food there, but over the last 2 months she has found she is unable to eat normally without getting severe bilateral upper abdominal crampy pain that can last as long as 14 hours.  She describes this as extreme gas pains that causes nausea, occasional dry heaves.  She has been eating primarily yogurt and juice for a while.She also thinks Maalox did help relieve the severe upper abominable cramps.  She has also had no bowel movement for as long as she could remember and then just  prior to arrival was given laxatives with subsequent loose stools. She has no abdominal pain.  She has had 3 loose non bloody bowel movements that she attributes to the recent laxatives. No lower abdominal  pain. Positive chronic constipation.  She has had her gallbladder removed decades ago.  Chart review shows normal liver labs 5 years ago.  Patient was admitted to ICU with concern over cholangitis, UTI, pyelonephritis.  There is concern of biliary origin as well.  She has been receiving IV Zosyn, IV fluids, and overnight her leukocytosis, lactic acid have improved.  The AST is now 53, ALT normalized to 36, total bilirubin is down 5.6.  Alkaline phos is 375, albumin 2.3 patient is feeling well today.   She does not know when she last took her Eliquis at the facility. She is NPO now and has not received Eliquis today.   Last colonoscopy: >20 years ago and a few polyps removed.  EGD: none   FH: Neg for GI malignancy or liver disease    Past Medical History:  Past Medical History:  Diagnosis Date   Asthma    COPD (chronic obstructive pulmonary disease) (HCC)    Depression    GERD (gastroesophageal reflux disease)    Hyperlipidemia    Hypertension    Hypothyroidism    Morbid obesity (HCC)    Osteoarthritis    PMB (postmenopausal bleeding)    Sleep apnea    Vaginal atrophy     Problem List: Patient Active Problem List   Diagnosis Date Noted   Abdominal pain 11/30/2022   Sepsis  secondary to UTI (HCC) 11/30/2022   COPD (chronic obstructive pulmonary disease) (HCC)    Acute on chronic diastolic (congestive) heart failure (HCC) 05/05/2020   Atrial fibrillation (HCC)    Acute CHF (congestive heart failure) (HCC) 08/22/2019   Community acquired pneumonia 05/26/2016   Pneumonia 05/26/2016   Pressure injury of skin 05/26/2016   Pressure ulcer 03/24/2015   Cellulitis 03/23/2015   Vaginal atrophy 08/19/2014   Asthma, chronic 04/18/2012   Obstructive sleep apnea 04/18/2012   Essential  hypertension, benign 04/18/2012   Hypercholesterolemia 04/18/2012   Critical aortic valve stenosis 04/18/2012   Endometrial cancer (HCC) 04/18/2012   Diverticulosis of colon without hemorrhage 04/18/2012   Hypothyroidism 04/18/2012   Overactive bladder 04/18/2012    Past Surgical History: Past Surgical History:  Procedure Laterality Date   ABDOMINAL HYSTERECTOMY  2014   [facility]   CHOLECYSTECTOMY     DILATION AND CURETTAGE OF UTERUS     UMBILICAL HERNIA REPAIR      Allergies: Allergies  Allergen Reactions   Morphine And Codeine    Vicodin [Hydrocodone-Acetaminophen] Other (See Comments)    Reaction:  Raises pts BP     Home Medications: Medications Prior to Admission  Medication Sig Dispense Refill Last Dose   acetaminophen (TYLENOL) 500 MG tablet Take 500-1,000 mg by mouth every 6 (six) hours as needed for mild pain or fever.    prn at unknown   acidophilus (RISAQUAD) CAPS capsule Take 1 capsule by mouth daily.   11/30/2022   albuterol (PROVENTIL HFA;VENTOLIN HFA) 108 (90 BASE) MCG/ACT inhaler Inhale 2 puffs into the lungs every 6 (six) hours as needed for wheezing or shortness of breath.    prn at unknown   apixaban (ELIQUIS) 5 MG TABS tablet Take 1 tablet (5 mg total) by mouth 2 (two) times daily. 60 tablet  11/30/2022   atorvastatin (LIPITOR) 10 MG tablet Take 10 mg by mouth daily.   11/29/2022   Calcium Carbonate-Vitamin D 600-400 MG-UNIT tablet Take 1 tablet by mouth daily after lunch.    11/30/2022   clotrimazole-betamethasone (LOTRISONE) cream Apply 1 Application topically 2 (two) times daily.   11/30/2022   COMBIVENT RESPIMAT 20-100 MCG/ACT AERS respimat Inhale 1 puff into the lungs daily.   11/30/2022   fluticasone (FLONASE) 50 MCG/ACT nasal spray Place 1 spray into both nostrils daily.   11/30/2022   furosemide (LASIX) 20 MG tablet Take 1 tablet (20 mg total) by mouth daily. 30 tablet  11/30/2022   ipratropium-albuterol (DUONEB) 0.5-2.5 (3) MG/3ML SOLN Take 3 mLs by nebulization 2  (two) times daily. 360 mL 1 11/30/2022   levothyroxine (SYNTHROID, LEVOTHROID) 125 MCG tablet Take 125 mcg by mouth daily before breakfast.   11/30/2022   loratadine (CLARITIN) 10 MG tablet Take 10 mg by mouth daily.   11/30/2022   NYSTATIN powder Apply 1 Application topically 3 (three) times daily.   11/30/2022   pantoprazole (PROTONIX) 40 MG tablet Take 40 mg by mouth daily.   11/30/2022   polyethylene glycol (MIRALAX / GLYCOLAX) 17 g packet Take 17 g by mouth daily. 14 each 0 prn at unknown   potassium chloride SA (KLOR-CON M) 20 MEQ tablet Take 20 mEq by mouth daily.   11/30/2022   senna (SENOKOT) 8.6 MG TABS tablet Take 2 tablets (17.2 mg total) by mouth daily. 120 tablet 0 prn at unknown   SYMBICORT 160-4.5 MCG/ACT inhaler Inhale 2 puffs into the lungs 2 (two) times daily.   11/30/2022   vitamin B-12 (CYANOCOBALAMIN) 1000  MCG tablet Take 1,000 mcg by mouth daily.   11/30/2022   Home medication reconciliation was completed with the patient.   Scheduled Inpatient Medications:    acidophilus  1 capsule Oral Daily   apixaban  5 mg Oral BID   atorvastatin  10 mg Oral Daily   Chlorhexidine Gluconate Cloth  6 each Topical Daily   hydrocortisone sod succinate (SOLU-CORTEF) inj  100 mg Intravenous Q12H   ipratropium-albuterol  3 mL Nebulization BID   levothyroxine  125 mcg Oral Q0600   mupirocin ointment   Nasal BID   nystatin cream   Topical BID   sodium chloride flush  3 mL Intravenous Q12H   thiamine  100 mg Oral Daily    Continuous Inpatient Infusions:    piperacillin-tazobactam (ZOSYN)  IV 12.5 mL/hr at 12/01/22 0600    PRN Inpatient Medications:  acetaminophen, albuterol, mouth rinse  Family History: family history includes Alzheimer's disease in her mother; Brain cancer in her brother; Leukemia in her mother.  The patient's family history is negative for inflammatory bowel disorders, GI malignancy, or solid organ transplantation.  Social History:   reports that she has never  smoked. She has never used smokeless tobacco. She reports that she does not drink alcohol and does not use drugs. The patient denies ETOH, tobacco, or drug use.   Review of Systems: Constitutional: Subjective weight loss.  Eyes: No changes in vision. ENT: No oral lesions, sore throat.  GI: see HPI.  Heme/Lymph: No easy bruising.  CV: No chest pain.  GU: No hematuria.  Integumentary: No rashes.  Neuro: No headaches.  Psych: No depression/anxiety.  Endocrine: No heat/cold intolerance.  Allergic/Immunologic: No urticaria.  Resp: No cough, SOB.  Musculoskeletal: No joint swelling.    Physical Examination: BP (!) 126/116   Pulse (!) 104   Temp 98.2 F (36.8 C) (Oral)   Resp (!) 28   Ht 5\' 5"  (1.651 m)   Wt 125.8 kg   LMP 04/16/1993   SpO2 98%   BMI 46.15 kg/m  Gen: NAD, alert and oriented x 4, good historian HEENT: EOMI, Neck: supple, no JVD or thyromegaly Chest: CTA bilaterally, no wheezes, crackles, or other adventitious sounds CV: RRR, loud murmur  Abd: soft, NT, ND, +BS in all four quadrants; no HSM, guarding, rigidity, or rebound tenderness Ext: no edema Skin: no rash or lesions noted Lymph: no LAD  Data: Lab Results  Component Value Date   WBC 11.1 (H) 12/01/2022   HGB 12.7 12/01/2022   HCT 37.5 12/01/2022   MCV 90.1 12/01/2022   PLT 335 12/01/2022   Recent Labs  Lab 11/30/22 1136 12/01/22 0713  HGB 13.0 12.7   Lab Results  Component Value Date   NA 130 (L) 11/30/2022   K 3.9 11/30/2022   CL 94 (L) 11/30/2022   CO2 24 11/30/2022   BUN 14 11/30/2022   CREATININE 0.48 11/30/2022   GLU 107 08/29/2011   Lab Results  Component Value Date   ALT 45 (H) 11/30/2022   AST 76 (H) 11/30/2022   GGT 224 (H) 11/30/2022   ALKPHOS 398 (H) 11/30/2022   BILITOT 8.1 (H) 11/30/2022   Recent Labs  Lab 12/01/22 0257  INR 1.6*   CLINICAL DATA:  Right lower quadrant pain jaundice abnormal x-rays   EXAM: CT ABDOMEN AND PELVIS WITH CONTRAST    TECHNIQUE: Multidetector CT imaging of the abdomen and pelvis was performed using the standard protocol following bolus administration of intravenous contrast.   RADIATION  DOSE REDUCTION: This exam was performed according to the departmental dose-optimization program which includes automated exposure control, adjustment of the mA and/or kV according to patient size and/or use of iterative reconstruction technique.   CONTRAST:  OMNIPAQUE IOHEXOL 300 MG/ML  SOLN   COMPARISON:  CT 10/08/2015   FINDINGS: Lower chest: No acute airspace disease. Cardiomegaly. Coronary vascular calcification. Mitral annular calcification. Dense aortic valvular calcification.   Hepatobiliary: Status post cholecystectomy. Small volume pneumobilia likely related to prior sphincterotomy. No intra hepatic biliary dilatation. Common bile duct diameter up to 9 mm   Pancreas: Unremarkable. No pancreatic ductal dilatation or surrounding inflammatory changes.   Spleen: Normal in size without focal abnormality.   Adrenals/Urinary Tract: Adrenal glands are within normal limits. Kidneys show no hydronephrosis. The bladder is unremarkable   Stomach/Bowel: Stomach is nonenlarged. There is no dilated small bowel. No acute bowel wall thickening. 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   Vascular/Lymphatic: Moderate aortic atherosclerosis. No aneurysm. No suspicious lymph nodes.   Reproductive: Hysterectomy.  No adnexal mass   Other: Negative for pelvic effusion or free air.   Musculoskeletal: Degenerative changes. No acute or suspicious osseous abnormality   IMPRESSION: 1. No CT evidence for acute intra-abdominal or pelvic abnormality. 2. Status post cholecystectomy. Small volume pneumobilia likely related to prior sphincterotomy. Common bile duct diameter up to 9 mm. 3. 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 4. Cardiomegaly. Dense  aortic valvular calcification. 5. Prior ventral hernia repair 6. Aortic atherosclerosis.   Aortic Atherosclerosis (ICD10-I70.0).     Electronically Signed   By: Jasmine Pang M.D.   On: 11/30/2022 17:11  Assessment/Plan: Ms. Wooldridge is a 82 y.o. female  seen for evaluation of abdominal pain, elevated LFTs and GGT at the request of Dr. Irena Cords. She has a  PMH of asthma, COPD, chronic diastolic CHF,  severe aortic stenosis, chronic atrial fibrillation on Eliquis, hypertension, hyperlipidemia, hypothyroidism, morbid obesity, depression/anxiety.   She has had  a two month history of poor appetite, intermittent upper abdominal severe cramping pain after eating, weight loss, jaundice and fatigue.  Laboratory studies on admission showed mild leukocytosis, significantly elevated bilirubin, and CT of the abdomen showed small amount of pneumobilia, and common bile duct is 9 mm with history of cholecystectomy.    # Elevated LFTs, bilirubin, alkaline phosphorous, and GGT # Mild leukocytosis and elevated lactic acid # Postprandial abdominal pain described as severe upper abdomen cramping with mild nausea , decreased appetite and subjective weight loss also associated with jaundice and fatigue. She also thinks Maalox did help relieve the severe upper abominable cramps.  - Vital signs stable  - Labs were significant for AST 76, ALT 45, ALKP 398, TB 8.1, Dir bili 4.3, GGT 224, WBC 13.5, Hgb 13, Pt 20.8, INR 1.8, A1c 4.9, UA pos nitrite, WBC 11-20 - Labs are improving overnight - Urine, blood and stool cultures pending.  -  Imaging studies revealed no CT evidence for acute intra-abdominal or pelvic abnormality. Status post cholecystectomy. Small volume pneumobilia likely related to prior sphincterotomy. Common bile duct diameter up to 9 mm. 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 - No hx of EGD  # Sepsis of unknown origin -- Labs are improving with IVF and Zosyn - Urine,  blood and stool cultures pending.  - Primary team managing  # Diarrhea following laxatives for constipation - Stool studies pending, but hx does not support C diff or bacterial  infection  - Self reports hx of colon polyps and last csy >20 years ago  #Afib on Eliquis #Severe aortic stenosis # Diastolic dysfunction with echo 2021 showing LVEF 60-65 %  -She does not know when she last took her Eliquis at the facility. She is NPO now and has not received Eliquis today.   Recommendations: - Obtain MRI/MRCP today to evaluate for any biliary obstruction.  The differential diagnosis is possible cholestasis secondary to sepsis. Also, suspicion for cholangitis. Less likely viral hepatitis. Rule out infiltrative diseases including malignancy. Screen for autoimmune disorders, Wilson's, hemochromatosis  - Hepatitis panel with additional autoimmune labs ordered- ASCMA, M2Ab, ceruloplasmin, ferritin  -- NPO - Hold Eliquis for today as we may need to add EGD to evaluate for gastritis, erosive gastritis/duodenitis, PUD, and malignancy if MRCP is unrevealing to explain 2 months of poor appetite, nausea,  post prandial upper abdomen pain and weight loss.  The patient is agreeable to the test if needed. - Consider addition Protonix  - Additional testing as needed.  Thank you for the consult. Please call with questions or concerns.  Amedeo Kinsman, ANP [facility] [phone]

## 2022-12-01 NOTE — Plan of Care (Signed)
  Problem: Fluid Volume: Goal: Hemodynamic stability will improve Outcome: Progressing   Problem: Respiratory: Goal: Ability to maintain adequate ventilation will improve Outcome: Progressing   Problem: Education: Goal: Knowledge of General Education information will improve Description: Including pain rating scale, medication(s)/side effects and non-pharmacologic comfort measures Outcome: Progressing   Problem: Activity: Goal: Risk for activity intolerance will decrease Outcome: Progressing   Problem: Pain Managment: Goal: General experience of comfort will improve Outcome: Progressing   Problem: Safety: Goal: Ability to remain free from injury will improve Outcome: Progressing   Problem: Skin Integrity: Goal: Risk for impaired skin integrity will decrease Outcome: Progressing

## 2022-12-01 NOTE — Progress Notes (Signed)
Progress Note    [name] [facility]  YQM:[id number] DOB: [birth year]  DOA: 11/30/2022 PCP: Pcp, No      Brief Narrative:    Medical records reviewed and are as summarized below:  [name] is a 82 y.o. female  with medical history significant for asthma, COPD, severe aortic stenosis, chronic diastolic CHF, A,fib, GERD, hypertension, hypothyroidism, morbid obesity, who was brought to the hospital from [facility] because of abnormal labs include x-ray(WBC 25,000, total bilirubin 8 and abdominal x-ray concerning for ileus).  She also complained of dysuria for several days.   She was admitted to the hospital for elevated liver enzymes and sepsis secondary to acute UTI.   Assessment/Plan:   Principal Problem:   Abdominal pain Active Problems:   Sepsis secondary to UTI [employer])   Essential hypertension, benign   Asthma, chronic   COPD (chronic obstructive pulmonary disease) (HCC)   Hypothyroidism   Obstructive sleep apnea   Atrial fibrillation (HCC)   Body mass index is 46.15 kg/m.  (Morbid obesity)   Severe sepsis secondary to acute UTI: Lactate was 2.2 on admission.  Continue IV antibiotics.  Follow-up urine and blood cultures.   Hyperbilirubinemia, elevated liver enzymes, abdominal pain: MRCP is pending.  Plan for EGD tomorrow. Small volume pneumobilia likely related to prior sphincterotomy per CT abdomen report. She has a history of cholecystectomy.   Diarrhea: Stool for C. difficile toxin negative.  GI pathogen was negative as well.  Diarrhea probably from use of laxatives at the nursing home.   Paroxysmal atrial fibrillation: Hold Eliquis in anticipation of EGD   Chronic diastolic CHF, severe aortic stenosis: Compensated 2D echo in 2021 showed EF estimated at 60 to 65%, indeterminate LV diastolic parameters, severe AS   Hypotension: BP has improved.  Cortisol level greater than 100.  Discontinue IV hydrocortisone   Other  comorbidities include hypothyroidism, OSA on CPAP   Diet Order             Diet full liquid Room service appropriate? Yes; Fluid consistency: Thin  Diet effective now                            Consultants: Gastroenterologist  Procedures: None    Medications:    acidophilus  1 capsule Oral Daily   atorvastatin  10 mg Oral Daily   Chlorhexidine Gluconate Cloth  6 each Topical Daily   hydrocortisone sod succinate (SOLU-CORTEF) inj  100 mg Intravenous Q12H   ipratropium-albuterol  3 mL Nebulization BID   levothyroxine  125 mcg Oral Q0600   mupirocin ointment   Nasal BID   nystatin cream   Topical BID   pantoprazole (PROTONIX) IV  40 mg Intravenous Q12H   sodium chloride flush  3 mL Intravenous Q12H   thiamine  100 mg Oral Daily   Continuous Infusions:  piperacillin-tazobactam (ZOSYN)  IV 12.5 mL/hr at 12/01/22 1107     Anti-infectives (From admission, onward)    Start     Dose/Rate Route Frequency Ordered Stop   12/01/22 0200  piperacillin-tazobactam (ZOSYN) IVPB 3.375 g        3.375 g 12.5 mL/hr over 240 Minutes Intravenous Every 8 hours 12/01/22 0035     11/30/22 2345  piperacillin-tazobactam (ZOSYN) IVPB 3.375 g  Status:  Discontinued        3.375 g 12.5 mL/hr over 240 Minutes Intravenous Every 8 hours 11/30/22 2335 12/01/22 0035  11/30/22 2330  metroNIDAZOLE (FLAGYL) IVPB 500 mg  Status:  Discontinued        500 mg 100 mL/hr over 60 Minutes Intravenous Every 12 hours 11/30/22 2324 11/30/22 2335   11/30/22 1815  piperacillin-tazobactam (ZOSYN) IVPB 3.375 g        3.375 g 12.5 mL/hr over 240 Minutes Intravenous  Once 11/30/22 1814 11/30/22 1920              Family Communication/Anticipated D/C date and plan/Code Status   DVT prophylaxis:      Code Status: Full Code  Family Communication: None Disposition Plan: Plan to discharge to nursing home   Status is: Inpatient Remains inpatient appropriate because: Sepsis from UTI,  elevated liver        Subjective:   Interval events noted.  She complains of diarrhea.  She said she had 3 watery stools overnight.  She still has some abdominal pain but it is better.  She also complains of burning urination.  No vomiting.  Marylene Land, RN, was at the bedside.  Objective:    Vitals:   12/01/22 0800 12/01/22 0900 12/01/22 1000 12/01/22 1100  BP: (!) 165/155 102/74 (!) 90/55 (!) 96/55  Pulse: 92 99 84 88  Resp: 18 (!) 26 18 19   Temp: 97.7 F (36.5 C)     TempSrc: Oral     SpO2: 96% 97% 98% 98%  Weight:      Height:       No data found.   Intake/Output Summary (Last 24 hours) at 12/01/2022 1248 Last data filed at 12/01/2022 1107 Gross per 24 hour  Intake 3262.64 ml  Output 400 ml  Net 2862.64 ml   Filed Weights   11/30/22 1134 12/01/22 0022  Weight: 127 kg 125.8 kg    Exam:  GEN: NAD SKIN: Warm and dry EYES: No pallor or icterus ENT: MMM CV: RRR, systolic murmur loudest in aortic area PULM: CTA B ABD: soft, obese, NT, +BS CNS: AAO x 3, non focal EXT: Chronic patchy erythematous changes on bilateral legs      Data Reviewed:   I have personally reviewed following labs and imaging studies:  Labs: Labs show the following:   Basic Metabolic Panel: Recent Labs  Lab 11/30/22 1136 12/01/22 0713  NA 130* 136  K 3.9 4.2  CL 94* 103  CO2 24 24  GLUCOSE 94 116*  BUN 14 14  CREATININE 0.48 0.72  CALCIUM 8.7* 8.5*   GFR Estimated Creatinine Clearance: 72.3 mL/min (by C-G formula based on SCr of 0.72 mg/dL). Liver Function Tests: Recent Labs  Lab 11/30/22 1136 12/01/22 0713  AST 76* 53*  ALT 45* 36  ALKPHOS 398* 375*  BILITOT 8.1* 5.6*  PROT 7.1 6.8  ALBUMIN 2.4* 2.3*   Recent Labs  Lab 11/30/22 1136  LIPASE 22   No results for input(s): "AMMONIA" in the last 168 hours. Coagulation profile Recent Labs  Lab 11/30/22 1523 12/01/22 0257  INR 1.8* 1.6*    CBC: Recent Labs  Lab 11/30/22 1136 12/01/22 0713  WBC 13.5*  11.1*  HGB 13.0 12.7  HCT 37.6 37.5  MCV 88.1 90.1  PLT 338 335   Cardiac Enzymes: No results for input(s): "CKTOTAL", "CKMB", "CKMBINDEX", "TROPONINI" in the last 168 hours. BNP (last 3 results) No results for input(s): "PROBNP" in the last 8760 hours. CBG: Recent Labs  Lab 12/01/22 0015 12/01/22 0047 12/01/22 0607  GLUCAP 67* 126* 100*   D-Dimer: No results for input(s): "DDIMER" in the  last 72 hours. Hgb A1c: Recent Labs    11/30/22 1136  HGBA1C 4.9   Lipid Profile: No results for input(s): "CHOL", "HDL", "LDLCALC", "TRIG", "CHOLHDL", "LDLDIRECT" in the last 72 hours. Thyroid function studies: No results for input(s): "TSH", "T4TOTAL", "T3FREE", "THYROIDAB" in the last 72 hours.  Invalid input(s): "FREET3" Anemia work up: Recent Labs    12/01/22 0713  FERRITIN 473*   Sepsis Labs: Recent Labs  Lab 11/30/22 1136 11/30/22 1946 11/30/22 2243 12/01/22 0713  WBC 13.5*  --   --  11.1*  LATICACIDVEN  --  2.2* 1.2  --     Microbiology Recent Results (from the past 240 hour(s))  Blood culture (single)     Status: None (Preliminary result)   Collection Time: 11/30/22  6:33 PM   Specimen: BLOOD  Result Value Ref Range Status   Specimen Description BLOOD RIGHT ANTECUBITAL  Final   Special Requests   Final    BOTTLES DRAWN AEROBIC AND ANAEROBIC Blood Culture results may not be optimal due to an inadequate volume of blood received in culture bottles   Culture   Final    NO GROWTH < 12 HOURS Performed at [facility], [address] [place], [place] [address]    Report Status PENDING  Incomplete  MRSA Next Gen by PCR, Nasal     Status: Abnormal   Collection Time: 12/01/22 12:38 AM   Specimen: Nasal Mucosa; Nasal Swab  Result Value Ref Range Status   MRSA by PCR Next Gen DETECTED (A) NOT DETECTED Final    Comment: RESULT CALLED TO, READ BACK BY AND VERIFIED WITH: REINIER ESTRANERO @0210  ON 12/01/22 SKL (NOTE) The GeneXpert MRSA Assay (FDA approved  for NASAL specimens only), is one component of a comprehensive MRSA colonization surveillance program. It is not intended to diagnose MRSA infection nor to guide or monitor treatment for MRSA infections. Test performance is not FDA approved in patients less than 73 years old. Performed at [facility], [address] [place], [place] [address]   Gastrointestinal Panel by PCR , Stool     Status: None   Collection Time: 12/01/22  6:52 AM   Specimen: Stool  Result Value Ref Range Status   Campylobacter species NOT DETECTED NOT DETECTED Final   Plesimonas shigelloides NOT DETECTED NOT DETECTED Final   Salmonella species NOT DETECTED NOT DETECTED Final   Yersinia enterocolitica NOT DETECTED NOT DETECTED Final   Vibrio species NOT DETECTED NOT DETECTED Final   Vibrio cholerae NOT DETECTED NOT DETECTED Final   Enteroaggregative E coli (EAEC) NOT DETECTED NOT DETECTED Final   Enteropathogenic E coli (EPEC) NOT DETECTED NOT DETECTED Final   Enterotoxigenic E coli (ETEC) NOT DETECTED NOT DETECTED Final   Shiga like toxin producing E coli (STEC) NOT DETECTED NOT DETECTED Final   Shigella/Enteroinvasive E coli (EIEC) NOT DETECTED NOT DETECTED Final   Cryptosporidium NOT DETECTED NOT DETECTED Final   Cyclospora cayetanensis NOT DETECTED NOT DETECTED Final   Entamoeba histolytica NOT DETECTED NOT DETECTED Final   Giardia lamblia NOT DETECTED NOT DETECTED Final   Adenovirus F40/41 NOT DETECTED NOT DETECTED Final   Astrovirus NOT DETECTED NOT DETECTED Final   Norovirus GI/GII NOT DETECTED NOT DETECTED Final   Rotavirus A NOT DETECTED NOT DETECTED Final   Sapovirus (I, II, IV, and V) NOT DETECTED NOT DETECTED Final    Comment: Performed at [facility], [address] [place], [place] [address]  C Difficile Quick Screen w PCR reflex  Status: None   Collection Time: 12/01/22  6:52 AM   Specimen: Stool  Result Value Ref Range Status   C Diff antigen NEGATIVE NEGATIVE Final    C Diff toxin NEGATIVE NEGATIVE Final   C Diff interpretation No C. difficile detected.  Final    Comment: Performed at [facility], [address] [place], [place] [address]    Procedures and diagnostic studies:  CT ABDOMEN PELVIS W CONTRAST  Result Date: 11/30/2022 CLINICAL DATA:  Right lower quadrant pain jaundice abnormal x-rays EXAM: CT ABDOMEN AND PELVIS WITH CONTRAST TECHNIQUE: Multidetector CT imaging of the abdomen and pelvis was performed using the standard protocol following bolus administration of intravenous contrast. RADIATION DOSE REDUCTION: This exam was performed according to the departmental dose-optimization program which includes automated exposure control, adjustment of the mA and/or kV according to patient size and/or use of iterative reconstruction technique. CONTRAST:  OMNIPAQUE IOHEXOL 300 MG/ML  SOLN COMPARISON:  CT 10/08/2015 FINDINGS: Lower chest: No acute airspace disease. Cardiomegaly. Coronary vascular calcification. Mitral annular calcification. Dense aortic valvular calcification. Hepatobiliary: Status post cholecystectomy. Small volume pneumobilia likely related to prior sphincterotomy. No intra hepatic biliary dilatation. Common bile duct diameter up to 9 mm Pancreas: Unremarkable. No pancreatic ductal dilatation or surrounding inflammatory changes. Spleen: Normal in size without focal abnormality. Adrenals/Urinary Tract: Adrenal glands are within normal limits. Kidneys show no hydronephrosis. The bladder is unremarkable Stomach/Bowel: Stomach is nonenlarged. There is no dilated small bowel. No acute bowel wall thickening. 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 Vascular/Lymphatic: Moderate aortic atherosclerosis. No aneurysm. No suspicious lymph nodes. Reproductive: Hysterectomy.  No adnexal mass Other: Negative for pelvic effusion or free air. Musculoskeletal: Degenerative changes. No acute or suspicious osseous  abnormality IMPRESSION: 1. No CT evidence for acute intra-abdominal or pelvic abnormality. 2. Status post cholecystectomy. Small volume pneumobilia likely related to prior sphincterotomy. Common bile duct diameter up to 9 mm. 3. 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 4. Cardiomegaly. Dense aortic valvular calcification. 5. Prior ventral hernia repair 6. Aortic atherosclerosis. Aortic Atherosclerosis (ICD10-I70.0). Electronically Signed   By: Jasmine Pang M.D.   On: 11/30/2022 17:11               LOS: 1 day   Kadejah Sandiford  [facility]   Pager on [email or web address]. If 7PM-7AM, please contact night-coverage at [email or web address]     12/01/2022, 12:48 PM

## 2022-12-01 NOTE — Discharge Summary (Addendum)
Physician Discharge Summary   Patient: [name] [facility] MRN: [id number] DOB: January 27, 1941  Admit date:     11/30/2022  Discharge date: 12/01/22  Discharge Physician: Lurene Shadow   PCP: Pcp, No   Recommendations at discharge:   Follow-up with hospitalist team and gastroenterologist, Dr. Elnoria Howard, at [facility] within 12 to for 24 hours of discharge  Discharge Diagnoses: Principal Problem:   Abdominal pain Active Problems:   Sepsis secondary to UTI [facility])   Essential hypertension, benign   Asthma, chronic   COPD (chronic obstructive pulmonary disease) (HCC)   Hypothyroidism   Obstructive sleep apnea   Atrial fibrillation (HCC)   Choledocholithiasis  Resolved Problems:   * No resolved hospital problems. *  Hospital Course:   Sumie Remsen is a 82 y.o. female  with medical history significant for asthma, COPD, severe aortic stenosis, chronic diastolic CHF, A,fib, GERD, hypertension, hypothyroidism, morbid obesity, who was brought to the hospital from [facility] because of abnormal labs include x-ray(WBC 25,000, total bilirubin 8 and abdominal x-ray concerning for ileus).  She also complained of dysuria for several days.     She was admitted to the hospital for elevated liver enzymes and sepsis secondary to acute UTI.    Assessment and Plan:  Severe sepsis secondary to acute UTI, probable acute cholangitis: Lactate was 2.2 on admission.  Continue IV Zosyn.  Follow-up urine and blood cultures.     Hyperbilirubinemia, elevated liver enzymes, abdominal pain: MRCP showed choledocholithiasis.  Case was discussed with Dr. Perlie Gold, gastroenterologist via secure chat.  He recommended transfer to [facility] for ERCP since there is no ERCP service available at [facility] this weekend. Case was discussed with Dr. Jeani Hawking, gastroenterologist at [facility].  He said he will be happy to see the patient in consultation for ERCP when the patient arrives at  [facility].  Patient will go to the hospitalist service. Please make patient n.p.o. after midnight if she gets to [employer].   Small volume pneumobilia likely related to prior sphincterotomy per CT abdomen report. She has a history of cholecystectomy.     Diarrhea: Stool for C. difficile toxin negative.  GI pathogen was negative as well.  Diarrhea probably from use of laxatives at the nursing home.     Paroxysmal atrial fibrillation: Hold Eliquis in anticipation of MRCP this weekend.     Chronic diastolic CHF, severe aortic stenosis: Compensated 2D echo in 2021 showed EF estimated at 60 to 65%, indeterminate LV diastolic parameters, severe AS     Hypotension: BP has improved.  Cortisol level greater than 100.  Discontinue IV hydrocortisone     Other comorbidities include hypothyroidism, OSA on CPAP    Discharge plan discussed with the patient and she is agreeable to go to [facility] for further management. Plan discussed with Lequita Halt, RN and Marchelle Folks, charge nurse  [facility] and patient flow nurse have  been notified     Consultants: Gastroenterologist Procedures performed: None Disposition:  [facility] Diet recommendation:  Full liquid diet and n.p.o. after midnight 12/02/2022 DISCHARGE MEDICATION: Allergies as of 12/01/2022       Reactions   Morphine And Codeine    Vicodin [hydrocodone-acetaminophen] Other (See Comments)   Reaction:  Raises pts BP         Medication List     STOP taking these medications    apixaban 5 MG Tabs tablet Commonly known as: Everlene Balls  TAKE these medications    acetaminophen 500 MG tablet Commonly known as: TYLENOL Take 500-1,000 mg by mouth every 6 (six) hours as needed for mild pain or fever.   acidophilus Caps capsule Take 1 capsule by mouth daily.   albuterol 108 (90 Base) MCG/ACT inhaler Commonly known as: VENTOLIN HFA Inhale 2 puffs into the lungs every 6 (six) hours as needed for wheezing  or shortness of breath.   atorvastatin 10 MG tablet Commonly known as: LIPITOR Take 10 mg by mouth daily.   Calcium Carbonate-Vitamin D 600-400 MG-UNIT tablet Take 1 tablet by mouth daily after lunch.   clotrimazole-betamethasone cream Commonly known as: LOTRISONE Apply 1 Application topically 2 (two) times daily.   cyanocobalamin 1000 MCG tablet Commonly known as: VITAMIN B12 Take 1,000 mcg by mouth daily.   fluticasone 50 MCG/ACT nasal spray Commonly known as: FLONASE Place 1 spray into both nostrils daily.   furosemide 20 MG tablet Commonly known as: LASIX Take 1 tablet (20 mg total) by mouth daily.   ipratropium-albuterol 0.5-2.5 (3) MG/3ML Soln Commonly known as: DUONEB Take 3 mLs by nebulization 2 (two) times daily.   Combivent Respimat 20-100 MCG/ACT Aers respimat Generic drug: Ipratropium-Albuterol Inhale 1 puff into the lungs daily.   levothyroxine 125 MCG tablet Commonly known as: SYNTHROID Take 125 mcg by mouth daily before breakfast.   loratadine 10 MG tablet Commonly known as: CLARITIN Take 10 mg by mouth daily.   nystatin powder Generic drug: nystatin Apply 1 Application topically 3 (three) times daily.   pantoprazole 40 MG tablet Commonly known as: PROTONIX Take 40 mg by mouth daily.   polyethylene glycol 17 g packet Commonly known as: MIRALAX / GLYCOLAX Take 17 g by mouth daily.   potassium chloride SA 20 MEQ tablet Commonly known as: KLOR-CON M Take 20 mEq by mouth daily.   senna 8.6 MG Tabs tablet Commonly known as: SENOKOT Take 2 tablets (17.2 mg total) by mouth daily.   Symbicort 160-4.5 MCG/ACT inhaler Generic drug: budesonide-formoterol Inhale 2 puffs into the lungs 2 (two) times daily.        Discharge Exam: Filed Weights   11/30/22 1134 12/01/22 0022  Weight: 127 kg 125.8 kg   GEN: NAD SKIN: Warm and dry EYES: No pallor or icterus ENT: MMM CV: RRR, systolic murmur loudest in aortic area PULM: CTA B ABD: soft,  obese, NT, +BS CNS: AAO x 3, non focal EXT: Chronic patchy erythematous changes on bilateral legs       Condition at discharge: good  The results of significant diagnostics from this hospitalization (including imaging, microbiology, ancillary and laboratory) are listed below for reference.   Imaging Studies: MR ABDOMEN MRCP W WO CONTAST  Result Date: 12/01/2022 CLINICAL DATA:  Biliary dilatation. Sepsis. Status post cholecystectomy. EXAM: MRI ABDOMEN WITHOUT AND WITH CONTRAST (INCLUDING MRCP) TECHNIQUE: Multiplanar multisequence MR imaging of the abdomen was performed both before and after the administration of intravenous contrast. Heavily T2-weighted images of the biliary and pancreatic ducts were obtained, and three-dimensional MRCP images were rendered by post processing. CONTRAST:  10mL GADAVIST GADOBUTROL 1 MMOL/ML IV SOLN COMPARISON:  CT abdomen/pelvis 11/30/2022 FINDINGS: Lower chest: Small bilateral effusions. Hepatobiliary: No suspicious focal abnormality within the liver parenchyma. Gallbladder surgically absent. Common duct measures 7 mm in the hepatoduodenal ligament. Common bile duct in the head of the pancreas measures 8 mm diameter, similar to prior CT. 4 x 6 mm filling defect is identified in the distal common bile duct just proximal to  the ampulla. This is favored to be a stone over an air bubble given that it is in a dependent position within the duct and on coronal thin MRCP imaging has angular contour (see image 8 of series 17). Pancreas: Multiple well-defined homogeneous small cystic lesions are identified in the pancreas, most of which are clustered in the pancreatic head region measuring up to about 12 mm. 4 mm cystic lesion identified in the body of the pancreas. No main duct dilatation. Spleen:  No splenomegaly. No suspicious focal mass lesion. Adrenals/Urinary Tract: No adrenal nodule or mass. Several tiny cysts are seen in the right kidney. Left kidney unremarkable.  Stomach/Bowel: Tiny hiatal hernia. No gastric wall thickening. No evidence of outlet obstruction. Duodenum is normally positioned as is the ligament of Treitz. No small bowel or colonic dilatation within the visualized abdomen. Vascular/Lymphatic: No abdominal aortic aneurysm. No abdominal aortic atherosclerotic calcification. There is no gastrohepatic or hepatoduodenal ligament lymphadenopathy. No retroperitoneal or mesenteric lymphadenopathy. Other:  No intraperitoneal free fluid. Musculoskeletal: No focal suspicious marrow enhancement within the visualized bony anatomy. IMPRESSION: 1. 4 x 6 mm filling defect in the distal common bile duct just proximal to the ampulla. This is favored to be a stone over an air bubble given that it is in a dependent position within the duct and on coronal thin MRCP imaging has an angular contour. 2. Multiple well-defined homogeneous small cystic lesions in the pancreas, most of which are clustered in the pancreatic head region measuring up to about 12 mm. Imaging features are most suggestive of benign etiology such as pseudocysts or indolent cystic neoplasm/side branch IPMN. Follow-up MRI in 6-12 months recommended to ensure stability. 3. Small bilateral effusions. 4. Tiny hiatal hernia. Electronically Signed   By: Kennith Center M.D.   On: 12/01/2022 16:12   MR 3D Recon At Scanner  Result Date: 12/01/2022 CLINICAL DATA:  Biliary dilatation. Sepsis. Status post cholecystectomy. EXAM: MRI ABDOMEN WITHOUT AND WITH CONTRAST (INCLUDING MRCP) TECHNIQUE: Multiplanar multisequence MR imaging of the abdomen was performed both before and after the administration of intravenous contrast. Heavily T2-weighted images of the biliary and pancreatic ducts were obtained, and three-dimensional MRCP images were rendered by post processing. CONTRAST:  10mL GADAVIST GADOBUTROL 1 MMOL/ML IV SOLN COMPARISON:  CT abdomen/pelvis 11/30/2022 FINDINGS: Lower chest: Small bilateral effusions. Hepatobiliary:  No suspicious focal abnormality within the liver parenchyma. Gallbladder surgically absent. Common duct measures 7 mm in the hepatoduodenal ligament. Common bile duct in the head of the pancreas measures 8 mm diameter, similar to prior CT. 4 x 6 mm filling defect is identified in the distal common bile duct just proximal to the ampulla. This is favored to be a stone over an air bubble given that it is in a dependent position within the duct and on coronal thin MRCP imaging has angular contour (see image 8 of series 17). Pancreas: Multiple well-defined homogeneous small cystic lesions are identified in the pancreas, most of which are clustered in the pancreatic head region measuring up to about 12 mm. 4 mm cystic lesion identified in the body of the pancreas. No main duct dilatation. Spleen:  No splenomegaly. No suspicious focal mass lesion. Adrenals/Urinary Tract: No adrenal nodule or mass. Several tiny cysts are seen in the right kidney. Left kidney unremarkable. Stomach/Bowel: Tiny hiatal hernia. No gastric wall thickening. No evidence of outlet obstruction. Duodenum is normally positioned as is the ligament of Treitz. No small bowel or colonic dilatation within the visualized abdomen. Vascular/Lymphatic: No abdominal aortic  aneurysm. No abdominal aortic atherosclerotic calcification. There is no gastrohepatic or hepatoduodenal ligament lymphadenopathy. No retroperitoneal or mesenteric lymphadenopathy. Other:  No intraperitoneal free fluid. Musculoskeletal: No focal suspicious marrow enhancement within the visualized bony anatomy. IMPRESSION: 1. 4 x 6 mm filling defect in the distal common bile duct just proximal to the ampulla. This is favored to be a stone over an air bubble given that it is in a dependent position within the duct and on coronal thin MRCP imaging has an angular contour. 2. Multiple well-defined homogeneous small cystic lesions in the pancreas, most of which are clustered in the pancreatic head  region measuring up to about 12 mm. Imaging features are most suggestive of benign etiology such as pseudocysts or indolent cystic neoplasm/side branch IPMN. Follow-up MRI in 6-12 months recommended to ensure stability. 3. Small bilateral effusions. 4. Tiny hiatal hernia. Electronically Signed   By: Kennith Center M.D.   On: 12/01/2022 16:12   CT ABDOMEN PELVIS W CONTRAST  Result Date: 11/30/2022 CLINICAL DATA:  Right lower quadrant pain jaundice abnormal x-rays EXAM: CT ABDOMEN AND PELVIS WITH CONTRAST TECHNIQUE: Multidetector CT imaging of the abdomen and pelvis was performed using the standard protocol following bolus administration of intravenous contrast. RADIATION DOSE REDUCTION: This exam was performed according to the departmental dose-optimization program which includes automated exposure control, adjustment of the mA and/or kV according to patient size and/or use of iterative reconstruction technique. CONTRAST:  OMNIPAQUE IOHEXOL 300 MG/ML  SOLN COMPARISON:  CT 10/08/2015 FINDINGS: Lower chest: No acute airspace disease. Cardiomegaly. Coronary vascular calcification. Mitral annular calcification. Dense aortic valvular calcification. Hepatobiliary: Status post cholecystectomy. Small volume pneumobilia likely related to prior sphincterotomy. No intra hepatic biliary dilatation. Common bile duct diameter up to 9 mm Pancreas: Unremarkable. No pancreatic ductal dilatation or surrounding inflammatory changes. Spleen: Normal in size without focal abnormality. Adrenals/Urinary Tract: Adrenal glands are within normal limits. Kidneys show no hydronephrosis. The bladder is unremarkable Stomach/Bowel: Stomach is nonenlarged. There is no dilated small bowel. No acute bowel wall thickening. 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 Vascular/Lymphatic: Moderate aortic atherosclerosis. No aneurysm. No suspicious lymph nodes. Reproductive: Hysterectomy.  No adnexal mass Other: Negative  for pelvic effusion or free air. Musculoskeletal: Degenerative changes. No acute or suspicious osseous abnormality IMPRESSION: 1. No CT evidence for acute intra-abdominal or pelvic abnormality. 2. Status post cholecystectomy. Small volume pneumobilia likely related to prior sphincterotomy. Common bile duct diameter up to 9 mm. 3. 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 4. Cardiomegaly. Dense aortic valvular calcification. 5. Prior ventral hernia repair 6. Aortic atherosclerosis. Aortic Atherosclerosis (ICD10-I70.0). Electronically Signed   By: Jasmine Pang M.D.   On: 11/30/2022 17:11    Microbiology: Results for orders placed or performed during the hospital encounter of 11/30/22  Blood culture (single)     Status: None (Preliminary result)   Collection Time: 11/30/22  6:33 PM   Specimen: BLOOD  Result Value Ref Range Status   Specimen Description BLOOD RIGHT ANTECUBITAL  Final   Special Requests   Final    BOTTLES DRAWN AEROBIC AND ANAEROBIC Blood Culture results may not be optimal due to an inadequate volume of blood received in culture bottles   Culture   Final    NO GROWTH < 12 HOURS Performed at [facility], [address] [place], [place] [address]    Report Status PENDING  Incomplete  MRSA Next Gen by PCR, Nasal     Status:  Abnormal   Collection Time: 12/01/22 12:38 AM   Specimen: Nasal Mucosa; Nasal Swab  Result Value Ref Range Status   MRSA by PCR Next Gen DETECTED (A) NOT DETECTED Final    Comment: RESULT CALLED TO, READ BACK BY AND VERIFIED WITH: REINIER ESTRANERO @0210  ON 12/01/22 SKL (NOTE) The GeneXpert MRSA Assay (FDA approved for NASAL specimens only), is one component of a comprehensive MRSA colonization surveillance program. It is not intended to diagnose MRSA infection nor to guide or monitor treatment for MRSA infections. Test performance is not FDA approved in patients less than 36 years old. Performed at [facility], [address] [place], [place] [address]   Gastrointestinal Panel by PCR , Stool     Status: None   Collection Time: 12/01/22  6:52 AM   Specimen: Stool  Result Value Ref Range Status   Campylobacter species NOT DETECTED NOT DETECTED Final   Plesimonas shigelloides NOT DETECTED NOT DETECTED Final   Salmonella species NOT DETECTED NOT DETECTED Final   Yersinia enterocolitica NOT DETECTED NOT DETECTED Final   Vibrio species NOT DETECTED NOT DETECTED Final   Vibrio cholerae NOT DETECTED NOT DETECTED Final   Enteroaggregative E coli (EAEC) NOT DETECTED NOT DETECTED Final   Enteropathogenic E coli (EPEC) NOT DETECTED NOT DETECTED Final   Enterotoxigenic E coli (ETEC) NOT DETECTED NOT DETECTED Final   Shiga like toxin producing E coli (STEC) NOT DETECTED NOT DETECTED Final   Shigella/Enteroinvasive E coli (EIEC) NOT DETECTED NOT DETECTED Final   Cryptosporidium NOT DETECTED NOT DETECTED Final   Cyclospora cayetanensis NOT DETECTED NOT DETECTED Final   Entamoeba histolytica NOT DETECTED NOT DETECTED Final   Giardia lamblia NOT DETECTED NOT DETECTED Final   Adenovirus F40/41 NOT DETECTED NOT DETECTED Final   Astrovirus NOT DETECTED NOT DETECTED Final   Norovirus GI/GII NOT DETECTED NOT DETECTED Final   Rotavirus A NOT DETECTED NOT DETECTED Final   Sapovirus (I, II, IV, and V) NOT DETECTED NOT DETECTED Final    Comment: Performed at [facility], [address] [place], [place] [address]  C Difficile Quick Screen w PCR reflex     Status: None   Collection Time: 12/01/22  6:52 AM   Specimen: Stool  Result Value Ref Range Status   C Diff antigen NEGATIVE NEGATIVE Final   C Diff toxin NEGATIVE NEGATIVE Final   C Diff interpretation No C. difficile detected.  Final    Comment: Performed at [facility], [address] [place], [place] [address]    Labs: CBC: Recent Labs  Lab 11/30/22 1136 12/01/22 0713  WBC 13.5* 11.1*  HGB 13.0 12.7  HCT 37.6 37.5  MCV  88.1 90.1  PLT 338 335   Basic Metabolic Panel: Recent Labs  Lab 11/30/22 1136 12/01/22 0713  NA 130* 136  K 3.9 4.2  CL 94* 103  CO2 24 24  GLUCOSE 94 116*  BUN 14 14  CREATININE 0.48 0.72  CALCIUM 8.7* 8.5*   Liver Function Tests: Recent Labs  Lab 11/30/22 1136 12/01/22 0713  AST 76* 53*  ALT 45* 36  ALKPHOS 398* 375*  BILITOT 8.1* 5.6*  PROT 7.1 6.8  ALBUMIN 2.4* 2.3*   CBG: Recent Labs  Lab 12/01/22 0015 12/01/22 0047 12/01/22 0607  GLUCAP 67* 126* 100*    Discharge time spent: greater than 30 minutes.  Signed: Lurene Shadow, MD [facility] 12/01/2022

## 2022-12-02 ENCOUNTER — Inpatient Hospital Stay (HOSPITAL_COMMUNITY): Payer: Medicare HMO

## 2022-12-02 ENCOUNTER — Inpatient Hospital Stay (HOSPITAL_COMMUNITY)
Admission: RE | Admit: 2022-12-02 | Discharge: 2022-12-03 | DRG: 445 | Disposition: A | Discharge status: Skilled Nursing Facility | Payer: Medicare HMO | Source: Other Acute Inpatient Hospital | Attending: Internal Medicine | Admitting: Internal Medicine

## 2022-12-02 ENCOUNTER — Encounter (HOSPITAL_COMMUNITY): Payer: Self-pay

## 2022-12-02 ENCOUNTER — Encounter (HOSPITAL_COMMUNITY)
Admission: RE | Disposition: A | Discharge status: Skilled Nursing Facility | Payer: Self-pay | Source: Other Acute Inpatient Hospital | Attending: Internal Medicine

## 2022-12-02 ENCOUNTER — Inpatient Hospital Stay (HOSPITAL_COMMUNITY): Payer: Medicare HMO | Admitting: Anesthesiology

## 2022-12-02 ENCOUNTER — Other Ambulatory Visit: Payer: Self-pay

## 2022-12-02 DIAGNOSIS — Z1623 Resistance to quinolones and fluoroquinolones: Secondary | ICD-10-CM | POA: Diagnosis present

## 2022-12-02 DIAGNOSIS — I110 Hypertensive heart disease with heart failure: Secondary | ICD-10-CM | POA: Diagnosis present

## 2022-12-02 DIAGNOSIS — J449 Chronic obstructive pulmonary disease, unspecified: Secondary | ICD-10-CM | POA: Diagnosis present

## 2022-12-02 DIAGNOSIS — L8990 Pressure ulcer of unspecified site, unspecified stage: Secondary | ICD-10-CM | POA: Diagnosis present

## 2022-12-02 DIAGNOSIS — Z820 Family history of epilepsy and other diseases of the nervous system: Secondary | ICD-10-CM

## 2022-12-02 DIAGNOSIS — R197 Diarrhea, unspecified: Secondary | ICD-10-CM | POA: Diagnosis present

## 2022-12-02 DIAGNOSIS — Z6841 Body Mass Index (BMI) 40.0 and over, adult: Secondary | ICD-10-CM | POA: Diagnosis not present

## 2022-12-02 DIAGNOSIS — G4733 Obstructive sleep apnea (adult) (pediatric): Secondary | ICD-10-CM | POA: Diagnosis present

## 2022-12-02 DIAGNOSIS — Z885 Allergy status to narcotic agent status: Secondary | ICD-10-CM

## 2022-12-02 DIAGNOSIS — I5032 Chronic diastolic (congestive) heart failure: Secondary | ICD-10-CM | POA: Diagnosis present

## 2022-12-02 DIAGNOSIS — Z7951 Long term (current) use of inhaled steroids: Secondary | ICD-10-CM | POA: Diagnosis not present

## 2022-12-02 DIAGNOSIS — Z79890 Hormone replacement therapy (postmenopausal): Secondary | ICD-10-CM

## 2022-12-02 DIAGNOSIS — A419 Sepsis, unspecified organism: Secondary | ICD-10-CM | POA: Diagnosis not present

## 2022-12-02 DIAGNOSIS — E785 Hyperlipidemia, unspecified: Secondary | ICD-10-CM | POA: Diagnosis present

## 2022-12-02 DIAGNOSIS — K8050 Calculus of bile duct without cholangitis or cholecystitis without obstruction: Secondary | ICD-10-CM | POA: Diagnosis present

## 2022-12-02 DIAGNOSIS — E039 Hypothyroidism, unspecified: Secondary | ICD-10-CM | POA: Diagnosis present

## 2022-12-02 DIAGNOSIS — I4891 Unspecified atrial fibrillation: Secondary | ICD-10-CM | POA: Diagnosis present

## 2022-12-02 DIAGNOSIS — J4489 Other specified chronic obstructive pulmonary disease: Secondary | ICD-10-CM | POA: Diagnosis present

## 2022-12-02 DIAGNOSIS — Z66 Do not resuscitate: Secondary | ICD-10-CM | POA: Diagnosis present

## 2022-12-02 DIAGNOSIS — I480 Paroxysmal atrial fibrillation: Secondary | ICD-10-CM | POA: Diagnosis present

## 2022-12-02 DIAGNOSIS — A4151 Sepsis due to Escherichia coli [E. coli]: Principal | ICD-10-CM | POA: Diagnosis present

## 2022-12-02 DIAGNOSIS — N390 Urinary tract infection, site not specified: Secondary | ICD-10-CM | POA: Diagnosis present

## 2022-12-02 DIAGNOSIS — Z79899 Other long term (current) drug therapy: Secondary | ICD-10-CM | POA: Diagnosis not present

## 2022-12-02 DIAGNOSIS — Z806 Family history of leukemia: Secondary | ICD-10-CM

## 2022-12-02 DIAGNOSIS — I482 Chronic atrial fibrillation, unspecified: Secondary | ICD-10-CM

## 2022-12-02 DIAGNOSIS — J45909 Unspecified asthma, uncomplicated: Secondary | ICD-10-CM | POA: Diagnosis not present

## 2022-12-02 DIAGNOSIS — I350 Nonrheumatic aortic (valve) stenosis: Secondary | ICD-10-CM

## 2022-12-02 DIAGNOSIS — K219 Gastro-esophageal reflux disease without esophagitis: Secondary | ICD-10-CM | POA: Diagnosis present

## 2022-12-02 DIAGNOSIS — Z808 Family history of malignant neoplasm of other organs or systems: Secondary | ICD-10-CM | POA: Diagnosis not present

## 2022-12-02 HISTORY — PX: REMOVAL OF STONES: SHX5545

## 2022-12-02 HISTORY — PX: ERCP: SHX5425

## 2022-12-02 LAB — COMPREHENSIVE METABOLIC PANEL
ALT: 28 U/L (ref 0–44)
AST: 36 U/L (ref 15–41)
Albumin: 1.9 g/dL — ABNORMAL LOW (ref 3.5–5.0)
Alkaline Phosphatase: 282 U/L — ABNORMAL HIGH (ref 38–126)
Anion gap: 7 (ref 5–15)
BUN: 13 mg/dL (ref 8–23)
CO2: 24 mmol/L (ref 22–32)
Calcium: 8.5 mg/dL — ABNORMAL LOW (ref 8.9–10.3)
Chloride: 102 mmol/L (ref 98–111)
Creatinine, Ser: 0.87 mg/dL (ref 0.44–1.00)
GFR, Estimated: >60 mL/min (ref >60)
Glucose, Bld: 134 mg/dL — ABNORMAL HIGH (ref 70–99)
Potassium: 3.9 mmol/L (ref 3.5–5.1)
Sodium: 133 mmol/L — ABNORMAL LOW (ref 135–145)
Total Bilirubin: 3.5 mg/dL — ABNORMAL HIGH (ref 0.3–1.2)
Total Protein: 5.9 g/dL — ABNORMAL LOW (ref 6.5–8.1)

## 2022-12-02 LAB — CBC
HCT: 32.1 % — ABNORMAL LOW (ref 36.0–46.0)
Hemoglobin: 10.8 g/dL — ABNORMAL LOW (ref 12.0–15.0)
MCH: 30.6 pg (ref 26.0–34.0)
MCHC: 33.6 g/dL (ref 30.0–36.0)
MCV: 90.9 fL (ref 80.0–100.0)
Platelets: 285 10*3/uL (ref 150–400)
RBC: 3.53 MIL/uL — ABNORMAL LOW (ref 3.87–5.11)
RDW: 15.1 % (ref 11.5–15.5)
WBC: 11.8 10*3/uL — ABNORMAL HIGH (ref 4.0–10.5)
nRBC: 0 % (ref 0.0–0.2)

## 2022-12-02 LAB — CERULOPLASMIN: Ceruloplasmin: 35 mg/dL (ref 19.0–39.0)

## 2022-12-02 SURGERY — ERCP, WITH INTERVENTION IF INDICATED
Anesthesia: General

## 2022-12-02 MED ORDER — SODIUM CHLORIDE 0.9 % IV SOLN
INTRAVENOUS | Status: DC | PRN
Start: 2022-12-02 — End: 2022-12-02

## 2022-12-02 MED ORDER — ATORVASTATIN CALCIUM 10 MG PO TABS
10.0000 mg | ORAL_TABLET | Freq: Every day | ORAL | Status: DC
  Administered 2022-12-03: 10 mg via ORAL
  Filled 2022-12-02 (×2): qty 1

## 2022-12-02 MED ORDER — LACTATED RINGERS IV SOLN
INTRAVENOUS | Status: AC | PRN
Start: 2022-12-02 — End: 2022-12-02
  Administered 2022-12-02: 10 mL/h via INTRAVENOUS

## 2022-12-02 MED ORDER — LACTATED RINGERS IV SOLN: INTRAVENOUS | Status: DC

## 2022-12-02 MED ORDER — PANTOPRAZOLE SODIUM 40 MG PO TBEC
40.0000 mg | DELAYED_RELEASE_TABLET | Freq: Every day | ORAL | Status: DC
  Administered 2022-12-03: 40 mg via ORAL
  Filled 2022-12-02 (×2): qty 1

## 2022-12-02 MED ORDER — IPRATROPIUM-ALBUTEROL 20-100 MCG/ACT IN AERS: 1.0000 | INHALATION_SPRAY | Freq: Every day | RESPIRATORY_TRACT | Status: DC

## 2022-12-02 MED ORDER — POTASSIUM CHLORIDE CRYS ER 20 MEQ PO TBCR
20.0000 meq | EXTENDED_RELEASE_TABLET | Freq: Every day | ORAL | Status: DC
  Administered 2022-12-03: 20 meq via ORAL
  Filled 2022-12-02 (×2): qty 1

## 2022-12-02 MED ORDER — ACETAMINOPHEN 650 MG RE SUPP: 650.0000 mg | Freq: Four times a day (QID) | RECTAL | Status: DC | PRN

## 2022-12-02 MED ORDER — ACETAMINOPHEN 325 MG PO TABS: 650.0000 mg | ORAL_TABLET | Freq: Four times a day (QID) | ORAL | Status: DC | PRN

## 2022-12-02 MED ORDER — ALBUTEROL SULFATE (2.5 MG/3ML) 0.083% IN NEBU: 3.0000 mL | INHALATION_SOLUTION | Freq: Four times a day (QID) | RESPIRATORY_TRACT | Status: DC | PRN

## 2022-12-02 MED ORDER — POLYVINYL ALCOHOL 1.4 % OP SOLN: 1.0000 [drp] | OPHTHALMIC | Status: DC | PRN

## 2022-12-02 MED ORDER — PIPERACILLIN-TAZOBACTAM 3.375 G IVPB
3.3750 g | Freq: Three times a day (TID) | INTRAVENOUS | Status: DC
  Administered 2022-12-02 – 2022-12-03 (×4): 3.375 g via INTRAVENOUS
  Filled 2022-12-02 (×5): qty 50

## 2022-12-02 MED ORDER — SUGAMMADEX SODIUM 200 MG/2ML IV SOLN
INTRAVENOUS | Status: DC | PRN
  Administered 2022-12-02: 400 mg via INTRAVENOUS

## 2022-12-02 MED ORDER — LIDOCAINE 2% (20 MG/ML) 5 ML SYRINGE
INTRAMUSCULAR | Status: DC | PRN
  Administered 2022-12-02: 60 mg via INTRAVENOUS

## 2022-12-02 MED ORDER — MOMETASONE FURO-FORMOTEROL FUM 200-5 MCG/ACT IN AERO
2.0000 | INHALATION_SPRAY | Freq: Two times a day (BID) | RESPIRATORY_TRACT | Status: DC
  Administered 2022-12-02 – 2022-12-03 (×2): 2 via RESPIRATORY_TRACT
  Filled 2022-12-02: qty 8.8

## 2022-12-02 MED ORDER — PROPOFOL 10 MG/ML IV BOLUS
INTRAVENOUS | Status: DC | PRN
  Administered 2022-12-02 (×2): 20 mg via INTRAVENOUS
  Administered 2022-12-02: 10 mg via INTRAVENOUS
  Administered 2022-12-02: 30 mg via INTRAVENOUS

## 2022-12-02 MED ORDER — NYSTATIN 100000 UNIT/GM EX POWD
1.0000 | Freq: Three times a day (TID) | CUTANEOUS | Status: DC
  Administered 2022-12-02 – 2022-12-03 (×5): 1 via TOPICAL
  Filled 2022-12-02: qty 15

## 2022-12-02 MED ORDER — SODIUM CHLORIDE 0.9 % IV SOLN: INTRAVENOUS | Status: DC

## 2022-12-02 MED ORDER — ONDANSETRON HCL 4 MG/2ML IJ SOLN: 4.0000 mg | Freq: Four times a day (QID) | INTRAMUSCULAR | Status: DC | PRN

## 2022-12-02 MED ORDER — GERHARDT'S BUTT CREAM
TOPICAL_CREAM | Freq: Two times a day (BID) | CUTANEOUS | Status: DC
  Filled 2022-12-02: qty 1

## 2022-12-02 MED ORDER — ONDANSETRON HCL 4 MG/2ML IJ SOLN
INTRAMUSCULAR | Status: DC | PRN
  Administered 2022-12-02: 4 mg via INTRAVENOUS

## 2022-12-02 MED ORDER — FENTANYL CITRATE (PF) 100 MCG/2ML IJ SOLN: 25.0000 ug | INTRAMUSCULAR | Status: DC | PRN

## 2022-12-02 MED ORDER — PHENYLEPHRINE 80 MCG/ML (10ML) SYRINGE FOR IV PUSH (FOR BLOOD PRESSURE SUPPORT)
PREFILLED_SYRINGE | INTRAVENOUS | Status: DC | PRN
  Administered 2022-12-02: 80 ug via INTRAVENOUS
  Administered 2022-12-02: 120 ug via INTRAVENOUS
  Administered 2022-12-02 (×2): 80 ug via INTRAVENOUS

## 2022-12-02 MED ORDER — SODIUM CHLORIDE 0.9 % IV SOLN
INTRAVENOUS | Status: DC | PRN
  Administered 2022-12-02: 25 mL

## 2022-12-02 MED ORDER — ROCURONIUM BROMIDE 10 MG/ML (PF) SYRINGE
PREFILLED_SYRINGE | INTRAVENOUS | Status: DC | PRN
  Administered 2022-12-02: 80 mg via INTRAVENOUS

## 2022-12-02 MED ORDER — IPRATROPIUM-ALBUTEROL 0.5-2.5 (3) MG/3ML IN SOLN
3.0000 mL | Freq: Two times a day (BID) | RESPIRATORY_TRACT | Status: DC
  Administered 2022-12-02: 3 mL via RESPIRATORY_TRACT
  Filled 2022-12-02: qty 3

## 2022-12-02 MED ORDER — LEVOTHYROXINE SODIUM 25 MCG PO TABS
125.0000 ug | ORAL_TABLET | Freq: Every day | ORAL | Status: DC
  Administered 2022-12-02 – 2022-12-03 (×2): 125 ug via ORAL
  Filled 2022-12-02 (×2): qty 1

## 2022-12-02 MED ORDER — FUROSEMIDE 20 MG PO TABS
20.0000 mg | ORAL_TABLET | Freq: Every day | ORAL | Status: DC
  Administered 2022-12-03: 20 mg via ORAL
  Filled 2022-12-02 (×2): qty 1

## 2022-12-02 MED ORDER — PHENYLEPHRINE HCL-NACL 20-0.9 MG/250ML-% IV SOLN
INTRAVENOUS | Status: DC | PRN
Start: 2022-12-02 — End: 2022-12-02
  Administered 2022-12-02: 40 ug/min via INTRAVENOUS

## 2022-12-02 MED ORDER — DICLOFENAC SUPPOSITORY 100 MG
RECTAL | Status: DC | PRN
  Administered 2022-12-02: 100 mg via RECTAL

## 2022-12-02 MED ORDER — RISAQUAD PO CAPS
1.0000 | ORAL_CAPSULE | Freq: Every day | ORAL | Status: DC
  Administered 2022-12-03: 1 via ORAL
  Filled 2022-12-02 (×2): qty 1

## 2022-12-02 MED ORDER — ONDANSETRON HCL 4 MG PO TABS: 4.0000 mg | ORAL_TABLET | Freq: Four times a day (QID) | ORAL | Status: DC | PRN

## 2022-12-02 NOTE — Assessment & Plan Note (Signed)
CPAP per home settings.   

## 2022-12-02 NOTE — Assessment & Plan Note (Signed)
Pt admitted with sepsis believed to be secondary to UTI initially. UCx NGTD Sepsis improved / resolved at this time. Cont zosyn which seems to be working clinically (for either UTI and/or cholangitis)

## 2022-12-02 NOTE — Assessment & Plan Note (Signed)
Cont home synthroid 

## 2022-12-02 NOTE — Progress Notes (Signed)
Pharmacy Antibiotic Note  [name] is a 82 y.o. female from [facility] where she was admitted for elevated liver enzymes and sepsis secondary to acute UTI. Transferred to [facility] on 12/02/2022 with concerns for intra-abdominal infection and consultation for ERCP. Pharmacy has been consulted for Zosyn dosing.   -WBC 11.1, sCr 0.72, afebrile, cultures from [facility] no growth, last dose of zosyn ~1900 at [facility]  Plan: Zosyn 3.375gm IV every 8 hours Monitor renal function Follow up surgical plans, LOT, de-escalation, signs of clinical improvement    Temp (24hrs), Avg:98.1 F (36.7 C), Min:97.5 F (36.4 C), Max:99.2 F (37.3 C)  Recent Labs  Lab 11/30/22 1136 11/30/22 1946 11/30/22 2243 12/01/22 0713  WBC 13.5*  --   --  11.1*  CREATININE 0.48  --   --  0.72  LATICACIDVEN  --  2.2* 1.2  --     Estimated Creatinine Clearance: 72.3 mL/min (by C-G formula based on SCr of 0.72 mg/dL).    Allergies  Allergen Reactions   Morphine And Codeine    Vicodin [Hydrocodone-Acetaminophen] Other (See Comments)    Reaction:  Raises pts BP     Antimicrobials this admission: Zosyn 9/27 >>   Microbiology results: 9/26 BCx: NGTD 9/26 UCx:   9/26 MRSA PCR: +  Thank you for allowing pharmacy to be a part of this patient's care.  Arabella Merles, PharmD. Clinical Pharmacist 12/02/2022 2:09 AM

## 2022-12-02 NOTE — Assessment & Plan Note (Addendum)
MRCP at [facility] done for abd pain was positive for choledocholithiasis. H/o prior cholecystectomy. Pt started on zosyn it looks like, will continue this [facility] hospitalist contacted Dr. Elnoria Howard as no ERCP available at [facility], pt transferred to [facility] for ERCP. Call Dr. Elnoria Howard in AM to let him know pt arrived. Clear liquid diet for now Repeat CBC/CMP in AM.

## 2022-12-02 NOTE — Anesthesia Procedure Notes (Signed)
Procedure Name: Intubation Date/Time: 12/02/2022 10:25 AM  Performed by: Gwenyth Allegra, CRNAPre-anesthesia Checklist: Patient identified, Emergency Drugs available, Suction available, Patient being monitored and Timeout performed Patient Re-evaluated:Patient Re-evaluated prior to induction Oxygen Delivery Method: Circle system utilized Preoxygenation: Pre-oxygenation with 100% oxygen Induction Type: IV induction Ventilation: Mask ventilation without difficulty and Oral airway inserted - appropriate to patient size Grade View: Grade II Tube type: Oral Tube size: 7.0 mm Number of attempts: 1 Airway Equipment and Method: Stylet Placement Confirmation: ETT inserted through vocal cords under direct vision, positive ETCO2 and breath sounds checked- equal and bilateral Secured at: 22 cm Tube secured with: Tape Dental Injury: Teeth and Oropharynx as per pre-operative assessment

## 2022-12-02 NOTE — Progress Notes (Signed)
Patient seen and examined this morning, admitted overnight, H&P reviewed and I agree with the assessment and plan.  82 year old female residing in an SNF for several years was admitted to [facility] on 9/26 with sepsis, initially thought to be due to UTI but eventually found to have choledocholithiasis.  She was transferred to [facility] overnight for gastroenterology consultation and ERCP today with Dr. Elnoria Howard  Continue to monitor post procedure, anticipate she could be able to be discharged within the next 1 to 2 days if there are no complications  Principal problem Choledocholithiasis-based on the MRCP.  GI consulted, she will get an ERCP today. -Continue Zosyn  Active problems Sepsis due to UTI-urine culture obtained 9/26 at [facility] still pending.  Monitor.  Currently on Zosyn for #1  Chronic diastolic CHF-appears euvolemic  Hyperlipidemia-continue atorvastatin  PAF-hold Eliquis.  I do not see that she is on any rate controlling agents  COPD-continue breathing treatments.  She has no wheezing  Hypothyroidism-continue Synthroid  OSA-continue CPAP  Diarrhea-C. difficile and GI pathogen panel were negative at [facility]  Scheduled Meds:  [MAR Hold] acidophilus  1 capsule Oral Daily   [MAR Hold] atorvastatin  10 mg Oral Daily   [MAR Hold] furosemide  20 mg Oral Daily   [MAR Hold] levothyroxine  125 mcg Oral QAC breakfast   [MAR Hold] mometasone-formoterol  2 puff Inhalation BID   [MAR Hold] nystatin  1 Application Topical TID   [MAR Hold] pantoprazole  40 mg Oral Daily   [MAR Hold] potassium chloride SA  20 mEq Oral Daily   Continuous Infusions:  sodium chloride 20 mL/hr at 12/02/22 0856   lactated ringers 10 mL/hr (12/02/22 0945)   [MAR Hold] piperacillin-tazobactam (ZOSYN)  IV 3.375 g (12/02/22 0236)   PRN Meds:.[MAR Hold] acetaminophen **OR** [MAR Hold] acetaminophen, [MAR Hold] albuterol, lactated ringers, [MAR Hold] ondansetron **OR** [MAR Hold] ondansetron (ZOFRAN) IV,  [MAR Hold] polyvinyl alcohol   Chet Greenley M. Elvera Lennox, MD, PhD [facility]  Between 7 am - 7 pm you can contact me via Amion (for emergencies) or Securechat (non urgent matters).  I am not available 7 pm - 7 am, please contact night coverage MD/APP via Amion

## 2022-12-02 NOTE — Anesthesia Preprocedure Evaluation (Signed)
Anesthesia Evaluation  Patient identified by MRN, date of birth, ID band Patient awake    Reviewed: Allergy & Precautions, NPO status , Patient's Chart, lab work & pertinent test results  History of Anesthesia Complications Negative for: history of anesthetic complications  Airway Mallampati: II  TM Distance: >3 FB Neck ROM: Full    Dental  (+) Dental Advisory Given, Missing   Pulmonary asthma , sleep apnea , COPD,  COPD inhaler   breath sounds clear to auscultation       Cardiovascular hypertension, (-) angina +CHF  (-) Past MI + dysrhythmias Atrial Fibrillation + Valvular Problems/Murmurs AS  Rhythm:Irregular + Systolic murmurs  1. Left ventricular ejection fraction, by estimation, is 60 to 65%. The  left ventricle has normal function. The left ventricle has no regional  wall motion abnormalities. There is moderate left ventricular hypertrophy.  Left ventricular diastolic  parameters are indeterminate.   2. Right ventricular systolic function is normal. The right ventricular  size is normal. Tricuspid regurgitation signal is inadequate for assessing  PA pressure.   3. 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 No  evidence of mitral stenosis.   4. The aortic valve is heavily calcified. Severe aortic valve stenosis.  Aortic valve mean gradient measures 48.0 mmHg. Aortic valve Vmax measures  4.49 m/s.   5. Rhythm suggestive of atrial fibrillation     Neuro/Psych  PSYCHIATRIC DISORDERS  Depression    negative neurological ROS     GI/Hepatic ,GERD  Medicated,,Choledocholithiasis   Endo/Other  Hypothyroidism    Renal/GU negative Renal ROSLab Results      Component                Value               Date                      NA                       133 (L)             12/02/2022                K                        3.9                 12/02/2022                CO2                      24                   12/02/2022                GLUCOSE                  134 (H)             12/02/2022                BUN                      13                  12/02/2022  CREATININE               0.87                12/02/2022                CALCIUM                  8.5 (L)             12/02/2022                GFR                      53.09 (L)           04/16/2012                GFRNONAA                 >60                 12/02/2022                Musculoskeletal  (+) Arthritis ,    Abdominal   Peds  Hematology  (+) Blood dyscrasia, anemia Lab Results      Component                Value               Date                      WBC                      11.8 (H)            12/02/2022                HGB                      10.8 (L)            12/02/2022                HCT                      32.1 (L)            12/02/2022                MCV                      90.9                12/02/2022                PLT                      285                 12/02/2022              Anesthesia Other Findings   Reproductive/Obstetrics                             Anesthesia Physical Anesthesia Plan  ASA: 3  Anesthesia Plan:    Post-op Pain Management: Minimal or no pain anticipated   Induction: Intravenous  PONV Risk Score and Plan: 2  and Ondansetron and Dexamethasone  Airway Management Planned: Oral ETT  Additional Equipment: None  Intra-op Plan:   Post-operative Plan: Extubation in OR  Informed Consent: I have reviewed the patients History and Physical, chart, labs and discussed the procedure including the risks, benefits and alternatives for the proposed anesthesia with the patient or authorized representative who has indicated his/her understanding and acceptance.     Dental advisory given  Plan Discussed with: CRNA  Anesthesia Plan Comments:        Anesthesia Quick Evaluation

## 2022-12-02 NOTE — Transfer of Care (Signed)
Immediate Anesthesia Transfer of Care Note  Patient: [name] [facility]  Procedure(s) Performed: ENDOSCOPIC RETROGRADE CHOLANGIOPANCREATOGRAPHY (ERCP) REMOVAL OF STONES  Patient Location: PACU  Anesthesia Type:General  Level of Consciousness: awake, alert , and oriented  Airway & Oxygen Therapy: Patient Spontanous Breathing  Post-op Assessment: Report given to RN and Post -op Vital signs reviewed and stable  Post vital signs: Reviewed and stable  Last Vitals:  Vitals Value Taken Time  BP 151/103 12/02/22 1118  Temp    Pulse 73 12/02/22 1121  Resp 12 12/02/22 1121  SpO2 100 % 12/02/22 1121  Vitals shown include unfiled device data.  Last Pain:  Vitals:   12/02/22 0936  TempSrc: Temporal  PainSc: 0-No pain         Complications: No notable events documented.

## 2022-12-02 NOTE — Consult Note (Signed)
WOC Nurse Consult Note: Reason for Consult: redness to the abdominal folds and groin and buttocks  Wound type:ICD; irritant contact dermatitis ICD-10 CM Codes for Irritant Dermatitis L24A2 - Due to fecal, urinary or dual incontinence L30.4  - Erythema intertrigo. Also used for abrasion of the hand, chafing of the skin, dermatitis due to sweating and friction, friction dermatitis, friction eczema, and genital/thigh intertrigo.  Pressure Injury POA: NA Wound bed: redness  Drainage (amount, consistency, odor) none Periwound: intact  Dressing procedure/placement/frequency: Add Gerhardt's butt cream Add antimicrobial wicking fabric for areas affected by ICD  Re consult if needed, will not follow at this time. Thanks  Adrean Heitz [employer], RN,CWOCN, CNS, CWON-AP [phone])

## 2022-12-02 NOTE — Op Note (Signed)
[facility] Patient Name: [name] Procedure Date : 12/02/2022 MRN: [id number] Attending MD: Jeani Hawking , MD, [id number] Date of Birth: [birth year] CSN: [id number] Age: 82 Admit Type: Inpatient Procedure:                ERCP Indications:              Common bile duct stone(s) Providers:                Jeani Hawking, MD, Eliberto Ivory, RN, Salley Scarlet,                            Technician, Dairl Ponder, CRNA Referring MD:             Lurene Shadow Medicines:                General Anesthesia Complications:            No immediate complications. Estimated Blood Loss:     Estimated blood loss: none. Procedure:                Pre-Anesthesia Assessment:                           - Prior to the procedure, a History and Physical                            was performed, and patient medications and                            allergies were reviewed. The patient's tolerance of                            previous anesthesia was also reviewed. The risks                            and benefits of the procedure and the sedation                            options and risks were discussed with the patient.                            All questions were answered, and informed consent                            was obtained. Prior Anticoagulants: The patient has                            taken no anticoagulant or antiplatelet agents. ASA                            Grade Assessment: III - A patient with severe                            systemic disease. After reviewing the risks and  benefits, the patient was deemed in satisfactory                            condition to undergo the procedure.                           - Sedation was administered by an anesthesia                            professional. Deep sedation was attained.                           After obtaining informed consent, the scope was                            passed under direct  vision. Throughout the                            procedure, the patient's blood pressure, pulse, and                            oxygen saturations were monitored continuously. The                            [id number]-[id number] ([id number] Olympus duodenoscope was                            introduced through the mouth, and used to inject                            contrast into and used to inject contrast into the                            bile duct. The ERCP was accomplished without                            difficulty. The patient tolerated the procedure                            well. Findings:      A biliary sphincterotomy had been performed. The sphincterotomy appeared       open. The bile duct was deeply cannulated with the short-nosed traction       sphincterotome. Contrast was injected. I personally interpreted the bile       duct images. There was brisk flow of contrast through the ducts. Image       quality was excellent. Contrast extended to the hepatic ducts. The       common bile duct was markedly dilated and diffusely dilated, acquired.       The largest diameter was 14 mm. A short 0.035 inch Soft Jagwire was       passed into the biliary tree. The biliary tree was swept with a 15 mm       balloon starting at the bifurcation. One stone was removed. No stones       remained.  The ampulla was positive for a prior sphincterotomy. There was       spontaneous drainage of bile and no evidence of pus. The CBD was easily       cannulated and the guidewire was secured in the left intrahepatic ducts.       Contrast injection revealed a dilated CBD at approximately 14 mm. There       was no clear evidence of a filling defect with the cholangiogram. The       duct was swept with the 15 mm balloon and a moderately-sized stone was       extracted. The CBD was swept three additional times and no further       stones or debris were removed. The final occlusion cholangiogram was       negative  for any retained stones and the procedure was concluded. Impression:               - Prior biliary sphincterotomy appeared open.                           - The common bile duct was markedly dilated,                            acquired.                           - Choledocholithiasis was found. Complete removal                            was accomplished by balloon extraction.                           - The biliary tree was swept. Recommendation:           - Return patient to hospital ward for ongoing care.                           - Resume regular diet.                           - If patient is stable tomorrow she can be                            discharged back to the nursing facility. Procedure Code(s):        --- Professional ---                           [phone], Endoscopic retrograde                            cholangiopancreatography (ERCP); with removal of                            calculi/debris from biliary/pancreatic duct(s)                           19147, Endoscopic catheterization of the biliary  ductal system, radiological supervision and                            interpretation Diagnosis Code(s):        --- Professional ---                           K80.50, Calculus of bile duct without cholangitis                            or cholecystitis without obstruction                           K83.8, Other specified diseases of biliary tract CPT copyright 2022 [employer]. All rights reserved. The codes documented in this report are preliminary and upon coder review may  be revised to meet current compliance requirements. Jeani Hawking, MD Jeani Hawking, MD 12/02/2022 11:05:27 AM This report has been signed electronically. Number of Addenda: 0

## 2022-12-02 NOTE — Assessment & Plan Note (Addendum)
Eliquis on hold in anticipation of ERCP.

## 2022-12-02 NOTE — Plan of Care (Signed)
  Problem: Education: Goal: Knowledge of General Education information will improve Description: Including pain rating scale, medication(s)/side effects and non-pharmacologic comfort measures Outcome: Progressing   Problem: Health Behavior/Discharge Planning: Goal: Ability to manage health-related needs will improve Outcome: Progressing   Problem: Activity: Goal: Risk for activity intolerance will decrease Outcome: Progressing   Problem: Nutrition: Goal: Adequate nutrition will be maintained Outcome: Progressing   Problem: Elimination: Goal: Will not experience complications related to bowel motility Outcome: Progressing Goal: Will not experience complications related to urinary retention Outcome: Progressing   Problem: Pain Managment: Goal: General experience of comfort will improve Outcome: Progressing   Problem: Safety: Goal: Ability to remain free from injury will improve Outcome: Progressing   Problem: Skin Integrity: Goal: Risk for impaired skin integrity will decrease Outcome: Progressing   

## 2022-12-02 NOTE — Consult Note (Signed)
UNASSIGNED CONSULT  Reason for Consult: Choledocholithiasis Referring Physician: [facility]  [facility] HPI: This is an 82 year old female with a PMH of COPD, CHF, afib, morbid obesity, GERD, HTN, and asthma admitted for abdominal pain.  Over the past two months she reports having problems with intermittent abdominal pain.  The pain was reported to be under her ribs and it occurred with PO intake.  If she did not eat she did not have any pain.  The patient did report intermittent chills, feeling hot, and dry heaving over the past two months.  Forty years ago she had a cholecystectomy for symptomatic gallbladder "sludge".  At that time she had nausea/vomiting with her gallbladder symptoms.  She presented to [facility] ER when she had 18 hours of continuous pain under her "ribs".  On admission to [facility] her WBC was noted to be elevated at 13.5.  The liver panel showed an obstructive pattern:  AST 76, ALT 45, AP 398, and TB 8.1.  Imaging of her abdomen did not show any acute abnormalities.  Small volume pneumobilia was noted and she is s/p cholecystectomy.  The CBD measured 9 mm and there was no report of any intrahepatic biliary ductal dilation.  She was treated for sepsis of unknown etiology.  GI evaluated her and felt that the liver/TB abnormalities were secondary to sepsis, but an MRCP was obtained to exclude any biliary source.  The MRCP was positive for a filling defect in the distal CBD measuring 4x6 mm.  Her pancreas also showed multiple cysts in the head of the pancreas that were possibly pseudocyst versus side brand IPMNs.  There was no documentation of any fever at [facility].  With the positive MRCP she was transferred to [facility] for an ERCP.  Past Medical History:  Diagnosis Date   Asthma    COPD (chronic obstructive pulmonary disease) (HCC)    Depression    GERD (gastroesophageal reflux disease)    Hyperlipidemia    Hypertension    Hypothyroidism    Morbid obesity (HCC)     Osteoarthritis    PMB (postmenopausal bleeding)    Sleep apnea    Vaginal atrophy     Past Surgical History:  Procedure Laterality Date   ABDOMINAL HYSTERECTOMY  2014   [facility]   CHOLECYSTECTOMY     DILATION AND CURETTAGE OF UTERUS     UMBILICAL HERNIA REPAIR      Family History  Problem Relation Age of Onset   Alzheimer's disease Mother    Leukemia Mother    Brain cancer Brother    Heart disease Neg Hx    Diabetes Neg Hx    Breast cancer Neg Hx    Ovarian cancer Neg Hx     Social History:  reports that she has never smoked. She has never used smokeless tobacco. She reports that she does not drink alcohol and does not use drugs.  Allergies:  Allergies  Allergen Reactions   Morphine And Codeine    Vicodin [Hydrocodone-Acetaminophen] Other (See Comments)    Reaction:  Raises pts BP     Medications: Scheduled:  acidophilus  1 capsule Oral Daily   atorvastatin  10 mg Oral Daily   furosemide  20 mg Oral Daily   levothyroxine  125 mcg Oral QAC breakfast   mometasone-formoterol  2 puff Inhalation BID   nystatin  1 Application Topical TID   pantoprazole  40 mg Oral Daily   potassium chloride SA  20 mEq Oral Daily  Continuous:  sodium chloride 20 mL/hr at 12/02/22 0856   piperacillin-tazobactam (ZOSYN)  IV 3.375 g (12/02/22 0236)    Results for orders placed or performed during the hospital encounter of 12/02/22 (from the past 24 hour(s))  Comprehensive metabolic panel     Status: Abnormal   Collection Time: 12/02/22  3:08 AM  Result Value Ref Range   Sodium 133 (L) 135 - 145 mmol/L   Potassium 3.9 3.5 - 5.1 mmol/L   Chloride 102 98 - 111 mmol/L   CO2 24 22 - 32 mmol/L   Glucose, Bld 134 (H) 70 - 99 mg/dL   BUN 13 8 - 23 mg/dL   Creatinine, Ser 1.61 0.44 - 1.00 mg/dL   Calcium 8.5 (L) 8.9 - 10.3 mg/dL   Total Protein 5.9 (L) 6.5 - 8.1 g/dL   Albumin 1.9 (L) 3.5 - 5.0 g/dL   AST 36 15 - 41 U/L   ALT 28 0 - 44 U/L   Alkaline Phosphatase 282 (H) 38 - 126 U/L    Total Bilirubin 3.5 (H) 0.3 - 1.2 mg/dL   GFR, Estimated >09 >60 mL/min   Anion gap 7 5 - 15  CBC     Status: Abnormal   Collection Time: 12/02/22  3:08 AM  Result Value Ref Range   WBC 11.8 (H) 4.0 - 10.5 K/uL   RBC 3.53 (L) 3.87 - 5.11 MIL/uL   Hemoglobin 10.8 (L) 12.0 - 15.0 g/dL   HCT 45.4 (L) 09.8 - 11.9 %   MCV 90.9 80.0 - 100.0 fL   MCH 30.6 26.0 - 34.0 pg   MCHC 33.6 30.0 - 36.0 g/dL   RDW 14.7 82.9 - 56.2 %   Platelets 285 150 - 400 K/uL   nRBC 0.0 0.0 - 0.2 %     MR ABDOMEN MRCP W WO CONTAST  Result Date: 12/01/2022 CLINICAL DATA:  Biliary dilatation. Sepsis. Status post cholecystectomy. EXAM: MRI ABDOMEN WITHOUT AND WITH CONTRAST (INCLUDING MRCP) TECHNIQUE: Multiplanar multisequence MR imaging of the abdomen was performed both before and after the administration of intravenous contrast. Heavily T2-weighted images of the biliary and pancreatic ducts were obtained, and three-dimensional MRCP images were rendered by post processing. CONTRAST:  10mL GADAVIST GADOBUTROL 1 MMOL/ML IV SOLN COMPARISON:  CT abdomen/pelvis 11/30/2022 FINDINGS: Lower chest: Small bilateral effusions. Hepatobiliary: No suspicious focal abnormality within the liver parenchyma. Gallbladder surgically absent. Common duct measures 7 mm in the hepatoduodenal ligament. Common bile duct in the head of the pancreas measures 8 mm diameter, similar to prior CT. 4 x 6 mm filling defect is identified in the distal common bile duct just proximal to the ampulla. This is favored to be a stone over an air bubble given that it is in a dependent position within the duct and on coronal thin MRCP imaging has angular contour (see image 8 of series 17). Pancreas: Multiple well-defined homogeneous small cystic lesions are identified in the pancreas, most of which are clustered in the pancreatic head region measuring up to about 12 mm. 4 mm cystic lesion identified in the body of the pancreas. No main duct dilatation. Spleen:  No  splenomegaly. No suspicious focal mass lesion. Adrenals/Urinary Tract: No adrenal nodule or mass. Several tiny cysts are seen in the right kidney. Left kidney unremarkable. Stomach/Bowel: Tiny hiatal hernia. No gastric wall thickening. No evidence of outlet obstruction. Duodenum is normally positioned as is the ligament of Treitz. No small bowel or colonic dilatation within the visualized abdomen. Vascular/Lymphatic: No abdominal  aortic aneurysm. No abdominal aortic atherosclerotic calcification. There is no gastrohepatic or hepatoduodenal ligament lymphadenopathy. No retroperitoneal or mesenteric lymphadenopathy. Other:  No intraperitoneal free fluid. Musculoskeletal: No focal suspicious marrow enhancement within the visualized bony anatomy. IMPRESSION: 1. 4 x 6 mm filling defect in the distal common bile duct just proximal to the ampulla. This is favored to be a stone over an air bubble given that it is in a dependent position within the duct and on coronal thin MRCP imaging has an angular contour. 2. Multiple well-defined homogeneous small cystic lesions in the pancreas, most of which are clustered in the pancreatic head region measuring up to about 12 mm. Imaging features are most suggestive of benign etiology such as pseudocysts or indolent cystic neoplasm/side branch IPMN. Follow-up MRI in 6-12 months recommended to ensure stability. 3. Small bilateral effusions. 4. Tiny hiatal hernia. Electronically Signed   By: Kennith Center M.D.   On: 12/01/2022 16:12   MR 3D Recon At Scanner  Result Date: 12/01/2022 CLINICAL DATA:  Biliary dilatation. Sepsis. Status post cholecystectomy. EXAM: MRI ABDOMEN WITHOUT AND WITH CONTRAST (INCLUDING MRCP) TECHNIQUE: Multiplanar multisequence MR imaging of the abdomen was performed both before and after the administration of intravenous contrast. Heavily T2-weighted images of the biliary and pancreatic ducts were obtained, and three-dimensional MRCP images were rendered by post  processing. CONTRAST:  10mL GADAVIST GADOBUTROL 1 MMOL/ML IV SOLN COMPARISON:  CT abdomen/pelvis 11/30/2022 FINDINGS: Lower chest: Small bilateral effusions. Hepatobiliary: No suspicious focal abnormality within the liver parenchyma. Gallbladder surgically absent. Common duct measures 7 mm in the hepatoduodenal ligament. Common bile duct in the head of the pancreas measures 8 mm diameter, similar to prior CT. 4 x 6 mm filling defect is identified in the distal common bile duct just proximal to the ampulla. This is favored to be a stone over an air bubble given that it is in a dependent position within the duct and on coronal thin MRCP imaging has angular contour (see image 8 of series 17). Pancreas: Multiple well-defined homogeneous small cystic lesions are identified in the pancreas, most of which are clustered in the pancreatic head region measuring up to about 12 mm. 4 mm cystic lesion identified in the body of the pancreas. No main duct dilatation. Spleen:  No splenomegaly. No suspicious focal mass lesion. Adrenals/Urinary Tract: No adrenal nodule or mass. Several tiny cysts are seen in the right kidney. Left kidney unremarkable. Stomach/Bowel: Tiny hiatal hernia. No gastric wall thickening. No evidence of outlet obstruction. Duodenum is normally positioned as is the ligament of Treitz. No small bowel or colonic dilatation within the visualized abdomen. Vascular/Lymphatic: No abdominal aortic aneurysm. No abdominal aortic atherosclerotic calcification. There is no gastrohepatic or hepatoduodenal ligament lymphadenopathy. No retroperitoneal or mesenteric lymphadenopathy. Other:  No intraperitoneal free fluid. Musculoskeletal: No focal suspicious marrow enhancement within the visualized bony anatomy. IMPRESSION: 1. 4 x 6 mm filling defect in the distal common bile duct just proximal to the ampulla. This is favored to be a stone over an air bubble given that it is in a dependent position within the duct and on  coronal thin MRCP imaging has an angular contour. 2. Multiple well-defined homogeneous small cystic lesions in the pancreas, most of which are clustered in the pancreatic head region measuring up to about 12 mm. Imaging features are most suggestive of benign etiology such as pseudocysts or indolent cystic neoplasm/side branch IPMN. Follow-up MRI in 6-12 months recommended to ensure stability. 3. Small bilateral effusions. 4. Tiny hiatal  hernia. Electronically Signed   By: Kennith Center M.D.   On: 12/01/2022 16:12   CT ABDOMEN PELVIS W CONTRAST  Result Date: 11/30/2022 CLINICAL DATA:  Right lower quadrant pain jaundice abnormal x-rays EXAM: CT ABDOMEN AND PELVIS WITH CONTRAST TECHNIQUE: Multidetector CT imaging of the abdomen and pelvis was performed using the standard protocol following bolus administration of intravenous contrast. RADIATION DOSE REDUCTION: This exam was performed according to the departmental dose-optimization program which includes automated exposure control, adjustment of the mA and/or kV according to patient size and/or use of iterative reconstruction technique. CONTRAST:  OMNIPAQUE IOHEXOL 300 MG/ML  SOLN COMPARISON:  CT 10/08/2015 FINDINGS: Lower chest: No acute airspace disease. Cardiomegaly. Coronary vascular calcification. Mitral annular calcification. Dense aortic valvular calcification. Hepatobiliary: Status post cholecystectomy. Small volume pneumobilia likely related to prior sphincterotomy. No intra hepatic biliary dilatation. Common bile duct diameter up to 9 mm Pancreas: Unremarkable. No pancreatic ductal dilatation or surrounding inflammatory changes. Spleen: Normal in size without focal abnormality. Adrenals/Urinary Tract: Adrenal glands are within normal limits. Kidneys show no hydronephrosis. The bladder is unremarkable Stomach/Bowel: Stomach is nonenlarged. There is no dilated small bowel. No acute bowel wall thickening. Diverticular disease of the left colon without  acute wall thickening. Vascular/Lymphatic: Moderate aortic atherosclerosis. No aneurysm. No suspicious lymph nodes. Reproductive: Hysterectomy.  No adnexal mass Other: Negative for pelvic effusion or free air. Musculoskeletal: Degenerative changes. No acute or suspicious osseous abnormality IMPRESSION: 1. No CT evidence for acute intra-abdominal or pelvic abnormality. 2. Status post cholecystectomy. Small volume pneumobilia likely related to prior sphincterotomy. Common bile duct diameter up to 9 mm. 3. 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 4. Cardiomegaly. Dense aortic valvular calcification. 5. Prior ventral hernia repair 6. Aortic atherosclerosis. Aortic Atherosclerosis (ICD10-I70.0). Electronically Signed   By: Jasmine Pang M.D.   On: 11/30/2022 17:11    ROS:  As stated above in the HPI otherwise negative.  Blood pressure 94/63, pulse (!) 57, temperature 98 F (36.7 C), temperature source Oral, resp. rate 17, last menstrual period 04/16/1993, SpO2 97%.    PE: Gen: NAD, Alert and Oriented HEENT:  [place]/AT, EOMI Neck: Supple, no LAD Lungs: CTA Bilaterally CV: RRR without M/G/R ABD: Soft, NTND, +BS Ext: No C/C/E  Assessment/Plan: 1) Choledocholithiasis - ? Ascending cholangitis. 2) Upper abdominal pain. 3) Abnormal liver enzymes. 4) Pancreatic cysts - Given her age and comorbidities, no further work up is recommended.   The patient's clinical presentation is consistent with choledocholithiasis.  There is concern that she had an ascending cholangitis on admission at [facility].  Fortunately she was provided with Zosyn and she is stable.    Plan: 1) ERCP with stone extraction. Yudit Modesitt D 12/02/2022, 9:24 AM

## 2022-12-02 NOTE — H&P (Signed)
History and Physical    Patient: [name] NWG:[id number] DOB: 1940-12-09 DOA: 12/02/2022 DOS: the patient was seen and examined on 12/02/2022 PCP: Pcp, No  Patient coming from: SNF (Pt transferring from [facility], before [facility] was in SNF chronically).  Chief Complaint: Choledocholithiasis  HPI: [name] is a 82 y.o. female with medical history significant of HTN, HLD, OSA, obesity, COPD and asthma.  Pt recently admitted to [facility] on 9/26 with sepsis.  Initially believed to be secondary to UTI with c/o dysuria.  Pt noted to have elevated liver enzymes as well however, because of this and due to abd pain.  Further work up was done including MRCP which ultimately revealed what is believed to be a CBD stone.  Pt put on zosyn.  Pt transferred to [facility] due to no ERCP available at [facility].  Review of Systems: As mentioned in the history of present illness. All other systems reviewed and are negative. Past Medical History:  Diagnosis Date   Asthma    COPD (chronic obstructive pulmonary disease) (HCC)    Depression    GERD (gastroesophageal reflux disease)    Hyperlipidemia    Hypertension    Hypothyroidism    Morbid obesity (HCC)    Osteoarthritis    PMB (postmenopausal bleeding)    Sleep apnea    Vaginal atrophy    Past Surgical History:  Procedure Laterality Date   ABDOMINAL HYSTERECTOMY  2014   unc   CHOLECYSTECTOMY     DILATION AND CURETTAGE OF UTERUS     UMBILICAL HERNIA REPAIR     Social History:  reports that she has never smoked. She has never used smokeless tobacco. She reports that she does not drink alcohol and does not use drugs.  Allergies  Allergen Reactions   Morphine And Codeine    Vicodin [Hydrocodone-Acetaminophen] Other (See Comments)    Reaction:  Raises pts BP     Family History  Problem Relation Age of Onset   Alzheimer's disease Mother    Leukemia Mother    Brain cancer Brother    Heart disease Neg Hx    Diabetes Neg Hx    Breast cancer Neg  Hx    Ovarian cancer Neg Hx     Prior to Admission medications   Medication Sig Start Date End Date Taking? Authorizing Provider  acetaminophen (TYLENOL) 500 MG tablet Take 500-1,000 mg by mouth every 6 (six) hours as needed for mild pain or fever.     [provider]  acidophilus (RISAQUAD) CAPS capsule Take 1 capsule by mouth daily. 05/10/20   Lynn Ito, MD  albuterol (PROVENTIL HFA;VENTOLIN HFA) 108 (90 BASE) MCG/ACT inhaler Inhale 2 puffs into the lungs every 6 (six) hours as needed for wheezing or shortness of breath.     [provider]  atorvastatin (LIPITOR) 10 MG tablet Take 10 mg by mouth daily. 11/03/22   [provider]  Calcium Carbonate-Vitamin D 600-400 MG-UNIT tablet Take 1 tablet by mouth daily after lunch.     [provider]  clotrimazole-betamethasone (LOTRISONE) cream Apply 1 Application topically 2 (two) times daily. 11/28/22   [provider]  COMBIVENT RESPIMAT 20-100 MCG/ACT AERS respimat Inhale 1 puff into the lungs daily. 11/15/22   [provider]  fluticasone (FLONASE) 50 MCG/ACT nasal spray Place 1 spray into both nostrils daily. 10/03/22   [provider]  furosemide (LASIX) 20 MG tablet Take 1 tablet (20 mg total) by mouth daily. 05/10/20   Lynn Ito,  MD  ipratropium-albuterol (DUONEB) 0.5-2.5 (3) MG/3ML SOLN Take 3 mLs by nebulization 2 (two) times daily. 08/28/19   Mikhail, Nita Sells, DO  levothyroxine (SYNTHROID, LEVOTHROID) 125 MCG tablet Take 125 mcg by mouth daily before breakfast.    [provider]  loratadine (CLARITIN) 10 MG tablet Take 10 mg by mouth daily.    [provider]  NYSTATIN powder Apply 1 Application topically 3 (three) times daily. 11/24/22   [provider]  pantoprazole (PROTONIX) 40 MG tablet Take 40 mg by mouth daily. 11/22/22   [provider]  polyethylene glycol (MIRALAX / GLYCOLAX) 17 g packet Take 17 g by mouth daily. 05/10/20   Lynn Ito,  MD  potassium chloride SA (KLOR-CON M) 20 MEQ tablet Take 20 mEq by mouth daily. 11/10/22   [provider]  senna (SENOKOT) 8.6 MG TABS tablet Take 2 tablets (17.2 mg total) by mouth daily. 05/10/20   Lynn Ito, MD  SYMBICORT 160-4.5 MCG/ACT inhaler Inhale 2 puffs into the lungs 2 (two) times daily. 10/28/22   [provider]  vitamin B-12 (CYANOCOBALAMIN) 1000 MCG tablet Take 1,000 mcg by mouth daily.    [provider]    Physical Exam: Vitals:   12/02/22 0047  BP: 106/61  Pulse: 78  Resp: 18  Temp: (!) 97.5 F (36.4 C)  TempSrc: Oral  SpO2: 100%   Constitutional: NAD, calm, comfortable Respiratory: clear to auscultation bilaterally, no wheezing, no crackles. Normal respiratory effort. No accessory muscle use.  Cardiovascular: Regular rate and rhythm, no murmurs / rubs / gallops. No extremity edema. 2+ pedal pulses. No carotid bruits.  Abdomen: no tenderness, no masses palpated. No hepatosplenomegaly. Bowel sounds positive.  Skin: Venous stasis dermatitis of BLE, additionally pt with 1-2+ pitting edema of BLE. Neurologic: CN 2-12 grossly intact. Sensation intact, DTR normal. Strength 5/5 in all 4.  Psychiatric: Normal judgment and insight. Alert and oriented x 3. Normal mood.   Data Reviewed:    Labs on Admission: I have personally reviewed following labs and imaging studies  CBC: Recent Labs  Lab 11/30/22 1136 12/01/22 0713  WBC 13.5* 11.1*  HGB 13.0 12.7  HCT 37.6 37.5  MCV 88.1 90.1  PLT 338 335   Basic Metabolic Panel: Recent Labs  Lab 11/30/22 1136 12/01/22 0713  NA 130* 136  K 3.9 4.2  CL 94* 103  CO2 24 24  GLUCOSE 94 116*  BUN 14 14  CREATININE 0.48 0.72  CALCIUM 8.7* 8.5*   GFR: Estimated Creatinine Clearance: 72.3 mL/min (by C-G formula based on SCr of 0.72 mg/dL). Liver Function Tests: Recent Labs  Lab 11/30/22 1136 12/01/22 0713  AST 76* 53*  ALT 45* 36  ALKPHOS 398* 375*  BILITOT 8.1* 5.6*  PROT 7.1 6.8   ALBUMIN 2.4* 2.3*   Recent Labs  Lab 11/30/22 1136  LIPASE 22   No results for input(s): "AMMONIA" in the last 168 hours. Coagulation Profile: Recent Labs  Lab 11/30/22 1523 12/01/22 0257  INR 1.8* 1.6*   Cardiac Enzymes: No results for input(s): "CKTOTAL", "CKMB", "CKMBINDEX", "TROPONINI" in the last 168 hours. BNP (last 3 results) No results for input(s): "PROBNP" in the last 8760 hours. HbA1C: Recent Labs    11/30/22 1136  HGBA1C 4.9   CBG: Recent Labs  Lab 12/01/22 0015 12/01/22 0047 12/01/22 0607  GLUCAP 67* 126* 100*   Lipid Profile: No results for input(s): "CHOL", "HDL", "LDLCALC", "TRIG", "CHOLHDL", "LDLDIRECT" in the last 72 hours. Thyroid Function Tests: No results for  input(s): "TSH", "T4TOTAL", "FREET4", "T3FREE", "THYROIDAB" in the last 72 hours. Anemia Panel: Recent Labs    12/01/22 0713  FERRITIN 473*   Urine analysis:    Component Value Date/Time   COLORURINE AMBER (A) 11/30/2022 1946   APPEARANCEUR CLEAR (A) 11/30/2022 1946   APPEARANCEUR Clear 10/12/2013 0500   LABSPEC >1.046 (H) 11/30/2022 1946   LABSPEC 1.003 10/12/2013 0500   PHURINE 5.0 11/30/2022 1946   GLUCOSEU NEGATIVE 11/30/2022 1946   GLUCOSEU Negative 10/12/2013 0500   GLUCOSEU NEGATIVE 04/25/2012 0949   HGBUR SMALL (A) 11/30/2022 1946   BILIRUBINUR SMALL (A) 11/30/2022 1946   BILIRUBINUR Negative 10/12/2013 0500   KETONESUR NEGATIVE 11/30/2022 1946   PROTEINUR NEGATIVE 11/30/2022 1946   UROBILINOGEN 0.2 04/25/2012 0949   NITRITE POSITIVE (A) 11/30/2022 1946   LEUKOCYTESUR TRACE (A) 11/30/2022 1946   LEUKOCYTESUR 1+ 10/12/2013 0500    Radiological Exams on Admission: MR ABDOMEN MRCP W WO CONTAST  Result Date: 12/01/2022 CLINICAL DATA:  Biliary dilatation. Sepsis. Status post cholecystectomy. EXAM: MRI ABDOMEN WITHOUT AND WITH CONTRAST (INCLUDING MRCP) TECHNIQUE: Multiplanar multisequence MR imaging of the abdomen was performed both before and after the administration  of intravenous contrast. Heavily T2-weighted images of the biliary and pancreatic ducts were obtained, and three-dimensional MRCP images were rendered by post processing. CONTRAST:  10mL GADAVIST GADOBUTROL 1 MMOL/ML IV SOLN COMPARISON:  CT abdomen/pelvis 11/30/2022 FINDINGS: Lower chest: Small bilateral effusions. Hepatobiliary: No suspicious focal abnormality within the liver parenchyma. Gallbladder surgically absent. Common duct measures 7 mm in the hepatoduodenal ligament. Common bile duct in the head of the pancreas measures 8 mm diameter, similar to prior CT. 4 x 6 mm filling defect is identified in the distal common bile duct just proximal to the ampulla. This is favored to be a stone over an air bubble given that it is in a dependent position within the duct and on coronal thin MRCP imaging has angular contour (see image 8 of series 17). Pancreas: Multiple well-defined homogeneous small cystic lesions are identified in the pancreas, most of which are clustered in the pancreatic head region measuring up to about 12 mm. 4 mm cystic lesion identified in the body of the pancreas. No main duct dilatation. Spleen:  No splenomegaly. No suspicious focal mass lesion. Adrenals/Urinary Tract: No adrenal nodule or mass. Several tiny cysts are seen in the right kidney. Left kidney unremarkable. Stomach/Bowel: Tiny hiatal hernia. No gastric wall thickening. No evidence of outlet obstruction. Duodenum is normally positioned as is the ligament of Treitz. No small bowel or colonic dilatation within the visualized abdomen. Vascular/Lymphatic: No abdominal aortic aneurysm. No abdominal aortic atherosclerotic calcification. There is no gastrohepatic or hepatoduodenal ligament lymphadenopathy. No retroperitoneal or mesenteric lymphadenopathy. Other:  No intraperitoneal free fluid. Musculoskeletal: No focal suspicious marrow enhancement within the visualized bony anatomy. IMPRESSION: 1. 4 x 6 mm filling defect in the distal  common bile duct just proximal to the ampulla. This is favored to be a stone over an air bubble given that it is in a dependent position within the duct and on coronal thin MRCP imaging has an angular contour. 2. Multiple well-defined homogeneous small cystic lesions in the pancreas, most of which are clustered in the pancreatic head region measuring up to about 12 mm. Imaging features are most suggestive of benign etiology such as pseudocysts or indolent cystic neoplasm/side branch IPMN. Follow-up MRI in 6-12 months recommended to ensure stability. 3. Small bilateral effusions. 4. Tiny hiatal hernia. Electronically Signed   By: Minerva Areola  Molli Posey M.D.   On: 12/01/2022 16:12   MR 3D Recon At Scanner  Result Date: 12/01/2022 CLINICAL DATA:  Biliary dilatation. Sepsis. Status post cholecystectomy. EXAM: MRI ABDOMEN WITHOUT AND WITH CONTRAST (INCLUDING MRCP) TECHNIQUE: Multiplanar multisequence MR imaging of the abdomen was performed both before and after the administration of intravenous contrast. Heavily T2-weighted images of the biliary and pancreatic ducts were obtained, and three-dimensional MRCP images were rendered by post processing. CONTRAST:  10mL GADAVIST GADOBUTROL 1 MMOL/ML IV SOLN COMPARISON:  CT abdomen/pelvis 11/30/2022 FINDINGS: Lower chest: Small bilateral effusions. Hepatobiliary: No suspicious focal abnormality within the liver parenchyma. Gallbladder surgically absent. Common duct measures 7 mm in the hepatoduodenal ligament. Common bile duct in the head of the pancreas measures 8 mm diameter, similar to prior CT. 4 x 6 mm filling defect is identified in the distal common bile duct just proximal to the ampulla. This is favored to be a stone over an air bubble given that it is in a dependent position within the duct and on coronal thin MRCP imaging has angular contour (see image 8 of series 17). Pancreas: Multiple well-defined homogeneous small cystic lesions are identified in the pancreas, most of  which are clustered in the pancreatic head region measuring up to about 12 mm. 4 mm cystic lesion identified in the body of the pancreas. No main duct dilatation. Spleen:  No splenomegaly. No suspicious focal mass lesion. Adrenals/Urinary Tract: No adrenal nodule or mass. Several tiny cysts are seen in the right kidney. Left kidney unremarkable. Stomach/Bowel: Tiny hiatal hernia. No gastric wall thickening. No evidence of outlet obstruction. Duodenum is normally positioned as is the ligament of Treitz. No small bowel or colonic dilatation within the visualized abdomen. Vascular/Lymphatic: No abdominal aortic aneurysm. No abdominal aortic atherosclerotic calcification. There is no gastrohepatic or hepatoduodenal ligament lymphadenopathy. No retroperitoneal or mesenteric lymphadenopathy. Other:  No intraperitoneal free fluid. Musculoskeletal: No focal suspicious marrow enhancement within the visualized bony anatomy. IMPRESSION: 1. 4 x 6 mm filling defect in the distal common bile duct just proximal to the ampulla. This is favored to be a stone over an air bubble given that it is in a dependent position within the duct and on coronal thin MRCP imaging has an angular contour. 2. Multiple well-defined homogeneous small cystic lesions in the pancreas, most of which are clustered in the pancreatic head region measuring up to about 12 mm. Imaging features are most suggestive of benign etiology such as pseudocysts or indolent cystic neoplasm/side branch IPMN. Follow-up MRI in 6-12 months recommended to ensure stability. 3. Small bilateral effusions. 4. Tiny hiatal hernia. Electronically Signed   By: Kennith Center M.D.   On: 12/01/2022 16:12   CT ABDOMEN PELVIS W CONTRAST  Result Date: 11/30/2022 CLINICAL DATA:  Right lower quadrant pain jaundice abnormal x-rays EXAM: CT ABDOMEN AND PELVIS WITH CONTRAST TECHNIQUE: Multidetector CT imaging of the abdomen and pelvis was performed using the standard protocol following bolus  administration of intravenous contrast. RADIATION DOSE REDUCTION: This exam was performed according to the departmental dose-optimization program which includes automated exposure control, adjustment of the mA and/or kV according to patient size and/or use of iterative reconstruction technique. CONTRAST:  OMNIPAQUE IOHEXOL 300 MG/ML  SOLN COMPARISON:  CT 10/08/2015 FINDINGS: Lower chest: No acute airspace disease. Cardiomegaly. Coronary vascular calcification. Mitral annular calcification. Dense aortic valvular calcification. Hepatobiliary: Status post cholecystectomy. Small volume pneumobilia likely related to prior sphincterotomy. No intra hepatic biliary dilatation. Common bile duct diameter up to 9 mm Pancreas:  Unremarkable. No pancreatic ductal dilatation or surrounding inflammatory changes. Spleen: Normal in size without focal abnormality. Adrenals/Urinary Tract: Adrenal glands are within normal limits. Kidneys show no hydronephrosis. The bladder is unremarkable Stomach/Bowel: Stomach is nonenlarged. There is no dilated small bowel. No acute bowel wall thickening. 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 Vascular/Lymphatic: Moderate aortic atherosclerosis. No aneurysm. No suspicious lymph nodes. Reproductive: Hysterectomy.  No adnexal mass Other: Negative for pelvic effusion or free air. Musculoskeletal: Degenerative changes. No acute or suspicious osseous abnormality IMPRESSION: 1. No CT evidence for acute intra-abdominal or pelvic abnormality. 2. Status post cholecystectomy. Small volume pneumobilia likely related to prior sphincterotomy. Common bile duct diameter up to 9 mm. 3. 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 4. Cardiomegaly. Dense aortic valvular calcification. 5. Prior ventral hernia repair 6. Aortic atherosclerosis. Aortic Atherosclerosis (ICD10-I70.0). Electronically Signed   By: Jasmine Pang M.D.   On: 11/30/2022 17:11    EKG:  Independently reviewed.   Assessment and Plan: * Choledocholithiasis MRCP at [facility] done for abd pain was positive for choledocholithiasis. H/o prior cholecystectomy. Pt started on zosyn it looks like, will continue this [facility] hospitalist contacted Dr. Elnoria Howard as no ERCP available at [facility], pt transferred to [facility] for ERCP. Call Dr. Elnoria Howard in AM to let him know pt arrived. Clear liquid diet for now Repeat CBC/CMP in AM.  Sepsis secondary to UTI [facility]) Pt admitted with sepsis believed to be secondary to UTI initially. UCx NGTD Sepsis improved / resolved at this time. Cont zosyn which seems to be working clinically (for either UTI and/or cholangitis)  Chronic diastolic CHF (congestive heart failure) (HCC) In setting of severe AVS. Preserved EF. Starting to look more volume overloaded: has peripheral edema in BLE that pt reports has just started to develop over the past 24h or so (presumably this would be from IVF that they were using at [facility] to treat her presenting sepsis, I suspect). Hold off on any further IVF Will resume home Lasix 20mg  PO daily for the moment May need more aggressive diuresis later on, but will wait for them to do ERCP for now.  Atrial fibrillation (HCC) Eliquis on hold in anticipation of ERCP.  COPD (chronic obstructive pulmonary disease) (HCC) Cont Symbicort, combivent, duoneb, and PRN albuterol.  Hypothyroidism Cont home synthroid  Obstructive sleep apnea CPAP per home settings.  Pressure injury of skin Wound care consult ordered.  Diarrhea Holding her chronic laxatives as she was reportedly having diarrhea at [facility]. C.diff was negative at [facility] as was GI pathogen pnl Diarrhea was believed to be due to laxative use according to [facility] doc.      Advance Care Planning:   Code Status: Limited: Do not attempt resuscitation (DNR) -DNR-LIMITED -Do Not Intubate/DNI  Confirmed with patient, MOST form also in chart  Consults: Dr. Elnoria Howard contacted by transferring  physician, call in AM to let him know pt arrived.  Family Communication: None in room  Severity of Illness: The appropriate patient status for this patient is INPATIENT. Inpatient status is judged to be reasonable and necessary in order to provide the required intensity of service to ensure the patient's safety. The patient's presenting symptoms, physical exam findings, and initial radiographic and laboratory data in the context of their chronic comorbidities is felt to place them at high risk for further clinical deterioration. Furthermore, it is not anticipated that the patient will be medically stable for discharge from the hospital within 2 midnights of admission.   *  I certify that at the point of admission it is my clinical judgment that the patient will require inpatient hospital care spanning beyond 2 midnights from the point of admission due to high intensity of service, high risk for further deterioration and high frequency of surveillance required.*  Author: Hillary Bow., DO 12/02/2022 2:29 AM  For on call review [email or web address].

## 2022-12-02 NOTE — Assessment & Plan Note (Addendum)
In setting of severe AVS. Preserved EF. Starting to look more volume overloaded: has peripheral edema in BLE that pt reports has just started to develop over the past 24h or so (presumably this would be from IVF that they were using at [facility] to treat her presenting sepsis, I suspect). Hold off on any further IVF Will resume home Lasix 20mg  PO daily for the moment May need more aggressive diuresis later on, but will wait for them to do ERCP for now.

## 2022-12-02 NOTE — Assessment & Plan Note (Signed)
Wound care consult ordered.

## 2022-12-02 NOTE — Assessment & Plan Note (Signed)
Cont Symbicort, combivent, duoneb, and PRN albuterol.

## 2022-12-02 NOTE — Progress Notes (Signed)
   12/02/22 2129  BiPAP/CPAP/SIPAP  BiPAP/CPAP/SIPAP Pt Type Adult  Reason BIPAP/CPAP not in use Non-compliant

## 2022-12-02 NOTE — Assessment & Plan Note (Signed)
Holding her chronic laxatives as she was reportedly having diarrhea at [facility]. C.diff was negative at [facility] as was GI pathogen pnl Diarrhea was believed to be due to laxative use according to [facility] doc.

## 2022-12-02 NOTE — Anesthesia Postprocedure Evaluation (Signed)
Anesthesia Post Note  Patient: Valentino Hue [facility]  Procedure(s) Performed: ENDOSCOPIC RETROGRADE CHOLANGIOPANCREATOGRAPHY (ERCP) REMOVAL OF STONES     Patient location during evaluation: PACU Anesthesia Type: General Level of consciousness: awake and patient cooperative Pain management: pain level controlled Vital Signs Assessment: post-procedure vital signs reviewed and stable Respiratory status: spontaneous breathing, nonlabored ventilation, respiratory function stable and patient connected to nasal cannula oxygen Cardiovascular status: blood pressure returned to baseline and stable Postop Assessment: no apparent nausea or vomiting Anesthetic complications: no   No notable events documented.  Last Vitals:  Vitals:   12/02/22 1130 12/02/22 1157  BP: (!) 124/97 (!) 113/96  Pulse: 67 63  Resp: 19 18  Temp:  36.6 C  SpO2: 95% 99%    Last Pain:  Vitals:   12/02/22 1157  TempSrc: Oral  PainSc:                  Oleva Koo

## 2022-12-03 DIAGNOSIS — K8050 Calculus of bile duct without cholangitis or cholecystitis without obstruction: Secondary | ICD-10-CM | POA: Diagnosis not present

## 2022-12-03 LAB — COMPREHENSIVE METABOLIC PANEL
ALT: 35 U/L (ref 0–44)
AST: 54 U/L — ABNORMAL HIGH (ref 15–41)
Albumin: 1.9 g/dL — ABNORMAL LOW (ref 3.5–5.0)
Alkaline Phosphatase: 334 U/L — ABNORMAL HIGH (ref 38–126)
Anion gap: 15 (ref 5–15)
BUN: 11 mg/dL (ref 8–23)
CO2: 21 mmol/L — ABNORMAL LOW (ref 22–32)
Calcium: 8.4 mg/dL — ABNORMAL LOW (ref 8.9–10.3)
Chloride: 97 mmol/L — ABNORMAL LOW (ref 98–111)
Creatinine, Ser: 0.92 mg/dL (ref 0.44–1.00)
GFR, Estimated: >60 mL/min (ref >60)
Glucose, Bld: 130 mg/dL — ABNORMAL HIGH (ref 70–99)
Potassium: 3.6 mmol/L (ref 3.5–5.1)
Sodium: 133 mmol/L — ABNORMAL LOW (ref 135–145)
Total Bilirubin: 3.5 mg/dL — ABNORMAL HIGH (ref 0.3–1.2)
Total Protein: 5.7 g/dL — ABNORMAL LOW (ref 6.5–8.1)

## 2022-12-03 LAB — URINE CULTURE: Culture: 60000 — AB

## 2022-12-03 LAB — MITOCHONDRIAL ANTIBODIES: Mitochondrial M2 Ab, IgG: <20 U (ref 0.0–20.0)

## 2022-12-03 LAB — ANTI-SMOOTH MUSCLE ANTIBODY, IGG: F-Actin IgG: 41 U — ABNORMAL HIGH (ref 0–19)

## 2022-12-03 SURGERY — ESOPHAGOGASTRODUODENOSCOPY (EGD) WITH PROPOFOL
Anesthesia: General

## 2022-12-03 MED ORDER — AMOXICILLIN-POT CLAVULANATE 875-125 MG PO TABS: 1.0000 | ORAL_TABLET | Freq: Two times a day (BID) | ORAL | Status: AC

## 2022-12-03 MED ORDER — POLYETHYLENE GLYCOL 3350 17 G PO PACK
17.0000 g | PACK | Freq: Two times a day (BID) | ORAL | Status: DC
  Filled 2022-12-03: qty 1

## 2022-12-03 MED ORDER — GLYCERIN (LAXATIVE) 2 G RE SUPP
1.0000 | Freq: Once | RECTAL | Status: AC
  Administered 2022-12-03: 1 via RECTAL
  Filled 2022-12-03: qty 1

## 2022-12-03 NOTE — Discharge Summary (Signed)
Physician Discharge Summary  [name] [facility] HYQ:[id number] DOB: [birth year] DOA: 12/02/2022  PCP: Pcp, No  Admit date: 12/02/2022 Discharge date: 12/03/2022  Admitted From: SNF Disposition:  SNF  Recommendations for Outpatient Follow-up:  Follow up with PCP in 1-2 weeks Please obtain BMP/CBC in one week Continue Augmentin for 6 additional days  Home Health: none Equipment/Devices: none  Discharge Condition: stable CODE STATUS: DNR Diet Orders (From admission, onward)     Start     Ordered   12/02/22 1201  Diet regular Fluid consistency: Thin  Diet effective now       Question:  Fluid consistency:  Answer:  Thin   12/02/22 1200            HPI: Per admitting MD, [name] is a 82 y.o. female with medical history significant of HTN, HLD, OSA, obesity, COPD and asthma. Pt recently admitted to [facility] on 9/26 with sepsis.  Initially believed to be secondary to UTI with c/o dysuria.  Pt noted to have elevated liver enzymes as well however, because of this and due to abd pain.  Further work up was done including MRCP which ultimately revealed what is believed to be a CBD stone.  Pt put on zosyn. Pt transferred to [facility] due to no ERCP available at [facility].  Hospital Course / Discharge diagnoses: Principal Problem:   Choledocholithiasis Active Problems:   Sepsis secondary to UTI [facility])   Critical aortic valve stenosis   Atrial fibrillation (HCC)   Chronic diastolic CHF (congestive heart failure) (HCC)   Asthma, chronic   COPD (chronic obstructive pulmonary disease) (HCC)   Hypothyroidism   Obstructive sleep apnea   Pressure injury of skin   Diarrhea   Principal problem Choledocholithiasis, concern for cholangitis-based on the MRCP and blood work.  GI consulted, she was transferred from [facility] to [facility], underwent an ERCP on 9/28, choledocholithiasis was found status post balloon extraction, sphincterotomy.  She remained stable, tolerating a  regular diet following the procedure and will be discharged back to SNF in stable condition.  She will be transition to Augmentin on discharge   Active problems Sepsis due to UTI-urine culture obtained 9/26 at [place] showed E. coli resistant to ciprofloxacin otherwise sensitive.  She is on Augmentin as above  Chronic diastolic CHF-appears euvolemic Hyperlipidemia-continue atorvastatin PAF-resume home medications on discharge COPD-continue breathing treatments.  She has no wheezing Hypothyroidism-continue Synthroid OSA-continue CPAP Diarrhea-C. difficile and GI pathogen panel were negative at [facility]  Discharge Instructions  Allergies as of 12/03/2022       Reactions   Morphine And Codeine    Vicodin [hydrocodone-acetaminophen] Other (See Comments)   Reaction:  Raises pts BP         Medication List     TAKE these medications    acetaminophen 500 MG tablet Commonly known as: TYLENOL Take 500-1,000 mg by mouth every 6 (six) hours as needed for mild pain or fever.   acidophilus Caps capsule Take 1 capsule by mouth daily.   albuterol 108 (90 Base) MCG/ACT inhaler Commonly known as: VENTOLIN HFA Inhale 2 puffs into the lungs every 6 (six) hours as needed for wheezing or shortness of breath.   atorvastatin 10 MG tablet Commonly known as: LIPITOR Take 10 mg by mouth daily.   Calcium Carbonate-Vitamin D 600-400 MG-UNIT tablet Take 1 tablet by mouth daily after lunch.   clotrimazole-betamethasone cream Commonly known as: LOTRISONE Apply 1 Application topically 2 (two) times daily.   cyanocobalamin 1000  MCG tablet Commonly known as: VITAMIN B12 Take 1,000 mcg by mouth daily.   fluticasone 50 MCG/ACT nasal spray Commonly known as: FLONASE Place 1 spray into both nostrils daily.   furosemide 20 MG tablet Commonly known as: LASIX Take 1 tablet (20 mg total) by mouth daily.   ipratropium-albuterol 0.5-2.5 (3) MG/3ML Soln Commonly known as: DUONEB Take 3 mLs by  nebulization 2 (two) times daily.   Combivent Respimat 20-100 MCG/ACT Aers respimat Generic drug: Ipratropium-Albuterol Inhale 1 puff into the lungs daily.   levothyroxine 125 MCG tablet Commonly known as: SYNTHROID Take 125 mcg by mouth daily before breakfast.   loratadine 10 MG tablet Commonly known as: CLARITIN Take 10 mg by mouth daily.   nystatin powder Generic drug: nystatin Apply 1 Application topically 3 (three) times daily.   pantoprazole 40 MG tablet Commonly known as: PROTONIX Take 40 mg by mouth daily.   polyethylene glycol 17 g packet Commonly known as: MIRALAX / GLYCOLAX Take 17 g by mouth daily.   potassium chloride SA 20 MEQ tablet Commonly known as: KLOR-CON M Take 20 mEq by mouth daily.   senna 8.6 MG Tabs tablet Commonly known as: SENOKOT Take 2 tablets (17.2 mg total) by mouth daily.   Symbicort 160-4.5 MCG/ACT inhaler Generic drug: budesonide-formoterol Inhale 2 puffs into the lungs 2 (two) times daily.         Consultations: GI  Procedures/Studies:  DG ERCP  Result Date: 12/02/2022 CLINICAL DATA:  ERCP. EXAM: ERCP TECHNIQUE: Multiple spot images obtained with the fluoroscopic device and submitted for interpretation post-procedure. FLUOROSCOPY TIME: FLUOROSCOPY TIME 2 minutes, 2 seconds (39.5 mGy) COMPARISON:  MRCP-12/01/2022; CT abdomen pelvis-11/30/2022 FINDINGS: Four spot intraoperative fluoroscopic images of the right upper abdominal quadrant during ERCP are provided for review. Initial image demonstrates an ERCP probe overlying the right upper abdominal quadrant. Surgical clips overlies expected location of the gallbladder fossa. Surgical mesh overlies the midline of the abdomen. There is selective cannulation and opacification of the common bile duct which appears moderate to markedly dilated. Subsequent images demonstrate insufflation of a balloon within distal aspect of the CBD with presumed biliary sweeping and sphincterotomy. There is  minimal opacification of the intrahepatic biliary tree which appears mildly dilated. There is no definitive opacification of the cystic or pancreatic ducts. IMPRESSION: ERCP with biliary sweeping and presumed sphincterotomy as above. These images were submitted for radiologic interpretation only. Please see the procedural report for the amount of contrast and the fluoroscopy time utilized. Electronically Signed   By: Simonne Come M.D.   On: 12/02/2022 15:17   MR ABDOMEN MRCP W WO CONTAST  Result Date: 12/01/2022 CLINICAL DATA:  Biliary dilatation. Sepsis. Status post cholecystectomy. EXAM: MRI ABDOMEN WITHOUT AND WITH CONTRAST (INCLUDING MRCP) TECHNIQUE: Multiplanar multisequence MR imaging of the abdomen was performed both before and after the administration of intravenous contrast. Heavily T2-weighted images of the biliary and pancreatic ducts were obtained, and three-dimensional MRCP images were rendered by post processing. CONTRAST:  10mL GADAVIST GADOBUTROL 1 MMOL/ML IV SOLN COMPARISON:  CT abdomen/pelvis 11/30/2022 FINDINGS: Lower chest: Small bilateral effusions. Hepatobiliary: No suspicious focal abnormality within the liver parenchyma. Gallbladder surgically absent. Common duct measures 7 mm in the hepatoduodenal ligament. Common bile duct in the head of the pancreas measures 8 mm diameter, similar to prior CT. 4 x 6 mm filling defect is identified in the distal common bile duct just proximal to the ampulla. This is favored to be a stone over an air bubble given that  it is in a dependent position within the duct and on coronal thin MRCP imaging has angular contour (see image 8 of series 17). Pancreas: Multiple well-defined homogeneous small cystic lesions are identified in the pancreas, most of which are clustered in the pancreatic head region measuring up to about 12 mm. 4 mm cystic lesion identified in the body of the pancreas. No main duct dilatation. Spleen:  No splenomegaly. No suspicious focal mass  lesion. Adrenals/Urinary Tract: No adrenal nodule or mass. Several tiny cysts are seen in the right kidney. Left kidney unremarkable. Stomach/Bowel: Tiny hiatal hernia. No gastric wall thickening. No evidence of outlet obstruction. Duodenum is normally positioned as is the ligament of Treitz. No small bowel or colonic dilatation within the visualized abdomen. Vascular/Lymphatic: No abdominal aortic aneurysm. No abdominal aortic atherosclerotic calcification. There is no gastrohepatic or hepatoduodenal ligament lymphadenopathy. No retroperitoneal or mesenteric lymphadenopathy. Other:  No intraperitoneal free fluid. Musculoskeletal: No focal suspicious marrow enhancement within the visualized bony anatomy. IMPRESSION: 1. 4 x 6 mm filling defect in the distal common bile duct just proximal to the ampulla. This is favored to be a stone over an air bubble given that it is in a dependent position within the duct and on coronal thin MRCP imaging has an angular contour. 2. Multiple well-defined homogeneous small cystic lesions in the pancreas, most of which are clustered in the pancreatic head region measuring up to about 12 mm. Imaging features are most suggestive of benign etiology such as pseudocysts or indolent cystic neoplasm/side branch IPMN. Follow-up MRI in 6-12 months recommended to ensure stability. 3. Small bilateral effusions. 4. Tiny hiatal hernia. Electronically Signed   By: Kennith Center M.D.   On: 12/01/2022 16:12   MR 3D Recon At Scanner  Result Date: 12/01/2022 CLINICAL DATA:  Biliary dilatation. Sepsis. Status post cholecystectomy. EXAM: MRI ABDOMEN WITHOUT AND WITH CONTRAST (INCLUDING MRCP) TECHNIQUE: Multiplanar multisequence MR imaging of the abdomen was performed both before and after the administration of intravenous contrast. Heavily T2-weighted images of the biliary and pancreatic ducts were obtained, and three-dimensional MRCP images were rendered by post processing. CONTRAST:  10mL GADAVIST  GADOBUTROL 1 MMOL/ML IV SOLN COMPARISON:  CT abdomen/pelvis 11/30/2022 FINDINGS: Lower chest: Small bilateral effusions. Hepatobiliary: No suspicious focal abnormality within the liver parenchyma. Gallbladder surgically absent. Common duct measures 7 mm in the hepatoduodenal ligament. Common bile duct in the head of the pancreas measures 8 mm diameter, similar to prior CT. 4 x 6 mm filling defect is identified in the distal common bile duct just proximal to the ampulla. This is favored to be a stone over an air bubble given that it is in a dependent position within the duct and on coronal thin MRCP imaging has angular contour (see image 8 of series 17). Pancreas: Multiple well-defined homogeneous small cystic lesions are identified in the pancreas, most of which are clustered in the pancreatic head region measuring up to about 12 mm. 4 mm cystic lesion identified in the body of the pancreas. No main duct dilatation. Spleen:  No splenomegaly. No suspicious focal mass lesion. Adrenals/Urinary Tract: No adrenal nodule or mass. Several tiny cysts are seen in the right kidney. Left kidney unremarkable. Stomach/Bowel: Tiny hiatal hernia. No gastric wall thickening. No evidence of outlet obstruction. Duodenum is normally positioned as is the ligament of Treitz. No small bowel or colonic dilatation within the visualized abdomen. Vascular/Lymphatic: No abdominal aortic aneurysm. No abdominal aortic atherosclerotic calcification. There is no gastrohepatic or hepatoduodenal ligament lymphadenopathy. No  retroperitoneal or mesenteric lymphadenopathy. Other:  No intraperitoneal free fluid. Musculoskeletal: No focal suspicious marrow enhancement within the visualized bony anatomy. IMPRESSION: 1. 4 x 6 mm filling defect in the distal common bile duct just proximal to the ampulla. This is favored to be a stone over an air bubble given that it is in a dependent position within the duct and on coronal thin MRCP imaging has an angular  contour. 2. Multiple well-defined homogeneous small cystic lesions in the pancreas, most of which are clustered in the pancreatic head region measuring up to about 12 mm. Imaging features are most suggestive of benign etiology such as pseudocysts or indolent cystic neoplasm/side branch IPMN. Follow-up MRI in 6-12 months recommended to ensure stability. 3. Small bilateral effusions. 4. Tiny hiatal hernia. Electronically Signed   By: Kennith Center M.D.   On: 12/01/2022 16:12   CT ABDOMEN PELVIS W CONTRAST  Result Date: 11/30/2022 CLINICAL DATA:  Right lower quadrant pain jaundice abnormal x-rays EXAM: CT ABDOMEN AND PELVIS WITH CONTRAST TECHNIQUE: Multidetector CT imaging of the abdomen and pelvis was performed using the standard protocol following bolus administration of intravenous contrast. RADIATION DOSE REDUCTION: This exam was performed according to the departmental dose-optimization program which includes automated exposure control, adjustment of the mA and/or kV according to patient size and/or use of iterative reconstruction technique. CONTRAST:  OMNIPAQUE IOHEXOL 300 MG/ML  SOLN COMPARISON:  CT 10/08/2015 FINDINGS: Lower chest: No acute airspace disease. Cardiomegaly. Coronary vascular calcification. Mitral annular calcification. Dense aortic valvular calcification. Hepatobiliary: Status post cholecystectomy. Small volume pneumobilia likely related to prior sphincterotomy. No intra hepatic biliary dilatation. Common bile duct diameter up to 9 mm Pancreas: Unremarkable. No pancreatic ductal dilatation or surrounding inflammatory changes. Spleen: Normal in size without focal abnormality. Adrenals/Urinary Tract: Adrenal glands are within normal limits. Kidneys show no hydronephrosis. The bladder is unremarkable Stomach/Bowel: Stomach is nonenlarged. There is no dilated small bowel. No acute bowel wall thickening. 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 Vascular/Lymphatic:  Moderate aortic atherosclerosis. No aneurysm. No suspicious lymph nodes. Reproductive: Hysterectomy.  No adnexal mass Other: Negative for pelvic effusion or free air. Musculoskeletal: Degenerative changes. No acute or suspicious osseous abnormality IMPRESSION: 1. No CT evidence for acute intra-abdominal or pelvic abnormality. 2. Status post cholecystectomy. Small volume pneumobilia likely related to prior sphincterotomy. Common bile duct diameter up to 9 mm. 3. 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 4. Cardiomegaly. Dense aortic valvular calcification. 5. Prior ventral hernia repair 6. Aortic atherosclerosis. Aortic Atherosclerosis (ICD10-I70.0). Electronically Signed   By: Jasmine Pang M.D.   On: 11/30/2022 17:11     Subjective: - no chest pain, shortness of breath, no abdominal pain, nausea or vomiting.   Discharge Exam: BP (!) 95/52 (BP Location: Right Arm)   Pulse 61   Temp 98 F (36.7 C) (Oral)   Resp 18   LMP 04/16/1993   SpO2 98%   General: Pt is alert, awake, not in acute distress Cardiovascular: regular Respiratory: CTA bilaterally, no wheezing, no rhonchi Abdominal: Soft, NT, ND, bowel sounds + Extremities: no edema, no cyanosis   The results of significant diagnostics from this hospitalization (including imaging, microbiology, ancillary and laboratory) are listed below for reference.     Microbiology: Recent Results (from the past 240 hour(s))  Blood culture (single)     Status: None (Preliminary result)   Collection Time: 11/30/22  6:33 PM   Specimen: BLOOD  Result Value Ref Range Status  Specimen Description BLOOD RIGHT ANTECUBITAL  Final   Special Requests   Final    BOTTLES DRAWN AEROBIC AND ANAEROBIC Blood Culture results may not be optimal due to an inadequate volume of blood received in culture bottles   Culture   Final    NO GROWTH 3 DAYS Performed at [facility], [address], [place], [place] [address]    Report  Status PENDING  Incomplete  Urine Culture (for pregnant, neutropenic or urologic patients or patients with an indwelling urinary catheter)     Status: Abnormal   Collection Time: 11/30/22  7:45 PM   Specimen: Urine, Clean Catch  Result Value Ref Range Status   Specimen Description   Final    URINE, CLEAN CATCH Performed at [facility], [address], [place], [place] [address]    Special Requests   Final    NONE Performed at [facility], [address] [place], [place] [address]    Culture 60,000 COLONIES/mL ESCHERICHIA COLI (A)  Final   Report Status 12/03/2022 FINAL  Final   Organism ID, Bacteria ESCHERICHIA COLI (A)  Final      Susceptibility   Escherichia coli - MIC*    AMPICILLIN 8 SENSITIVE Sensitive     CEFAZOLIN <=4 SENSITIVE Sensitive     CEFEPIME <=0.12 SENSITIVE Sensitive     CEFTRIAXONE <=0.25 SENSITIVE Sensitive     CIPROFLOXACIN >=4 RESISTANT Resistant     GENTAMICIN <=1 SENSITIVE Sensitive     IMIPENEM <=0.25 SENSITIVE Sensitive     NITROFURANTOIN <=16 SENSITIVE Sensitive     TRIMETH/SULFA <=20 SENSITIVE Sensitive     AMPICILLIN/SULBACTAM 4 SENSITIVE Sensitive     PIP/TAZO <=4 SENSITIVE Sensitive     * 60,000 COLONIES/mL ESCHERICHIA COLI  MRSA Next Gen by PCR, Nasal     Status: Abnormal   Collection Time: 12/01/22 12:38 AM   Specimen: Nasal Mucosa; Nasal Swab  Result Value Ref Range Status   MRSA by PCR Next Gen DETECTED (A) NOT DETECTED Final    Comment: RESULT CALLED TO, READ BACK BY AND VERIFIED WITH: REINIER ESTRANERO @0210  ON 12/01/22 SKL (NOTE) The GeneXpert MRSA Assay (FDA approved for NASAL specimens only), is one component of a comprehensive MRSA colonization surveillance program. It is not intended to diagnose MRSA infection nor to guide or monitor treatment for MRSA infections. Test performance is not FDA approved in patients less than 2 years old. Performed at [facility], [address] [place], [place]  [address]   Gastrointestinal Panel by PCR , Stool     Status: None   Collection Time: 12/01/22  6:52 AM   Specimen: Stool  Result Value Ref Range Status   Campylobacter species NOT DETECTED NOT DETECTED Final   Plesimonas shigelloides NOT DETECTED NOT DETECTED Final   Salmonella species NOT DETECTED NOT DETECTED Final   Yersinia enterocolitica NOT DETECTED NOT DETECTED Final   Vibrio species NOT DETECTED NOT DETECTED Final   Vibrio cholerae NOT DETECTED NOT DETECTED Final   Enteroaggregative E coli (EAEC) NOT DETECTED NOT DETECTED Final   Enteropathogenic E coli (EPEC) NOT DETECTED NOT DETECTED Final   Enterotoxigenic E coli (ETEC) NOT DETECTED NOT DETECTED Final   Shiga like toxin producing E coli (STEC) NOT DETECTED NOT DETECTED Final   Shigella/Enteroinvasive E coli (EIEC) NOT DETECTED NOT DETECTED Final   Cryptosporidium NOT DETECTED NOT DETECTED Final   Cyclospora cayetanensis NOT DETECTED NOT DETECTED Final   Entamoeba histolytica NOT DETECTED NOT DETECTED Final  Giardia lamblia NOT DETECTED NOT DETECTED Final   Adenovirus F40/41 NOT DETECTED NOT DETECTED Final   Astrovirus NOT DETECTED NOT DETECTED Final   Norovirus GI/GII NOT DETECTED NOT DETECTED Final   Rotavirus A NOT DETECTED NOT DETECTED Final   Sapovirus (I, II, IV, and V) NOT DETECTED NOT DETECTED Final    Comment: Performed at [facility], [address], [place], [place] [address]  C Difficile Quick Screen w PCR reflex     Status: None   Collection Time: 12/01/22  6:52 AM   Specimen: Stool  Result Value Ref Range Status   C Diff antigen NEGATIVE NEGATIVE Final   C Diff toxin NEGATIVE NEGATIVE Final   C Diff interpretation No C. difficile detected.  Final    Comment: Performed at [facility], [address] [place], [place] [address]     Labs: Basic Metabolic Panel: Recent Labs  Lab 11/30/22 1136 12/01/22 0713 12/02/22 0308  NA 130* 136 133*  K 3.9 4.2 3.9  CL 94* 103 102  CO2 24 24  24   GLUCOSE 94 116* 134*  BUN 14 14 13   CREATININE 0.48 0.72 0.87  CALCIUM 8.7* 8.5* 8.5*   Liver Function Tests: Recent Labs  Lab 11/30/22 1136 12/01/22 0713 12/02/22 0308  AST 76* 53* 36  ALT 45* 36 28  ALKPHOS 398* 375* 282*  BILITOT 8.1* 5.6* 3.5*  PROT 7.1 6.8 5.9*  ALBUMIN 2.4* 2.3* 1.9*   CBC: Recent Labs  Lab 11/30/22 1136 12/01/22 0713 12/02/22 0308  WBC 13.5* 11.1* 11.8*  HGB 13.0 12.7 10.8*  HCT 37.6 37.5 32.1*  MCV 88.1 90.1 90.9  PLT 338 335 285   CBG: Recent Labs  Lab 12/01/22 0015 12/01/22 0047 12/01/22 0607  GLUCAP 67* 126* 100*   Hgb A1c Recent Labs    11/30/22 1136  HGBA1C 4.9   Lipid Profile No results for input(s): "CHOL", "HDL", "LDLCALC", "TRIG", "CHOLHDL", "LDLDIRECT" in the last 72 hours. Thyroid function studies No results for input(s): "TSH", "T4TOTAL", "T3FREE", "THYROIDAB" in the last 72 hours.  Invalid input(s): "FREET3" Urinalysis    Component Value Date/Time   COLORURINE AMBER (A) 11/30/2022 1946   APPEARANCEUR CLEAR (A) 11/30/2022 1946   APPEARANCEUR Clear 10/12/2013 0500   LABSPEC >1.046 (H) 11/30/2022 1946   LABSPEC 1.003 10/12/2013 0500   PHURINE 5.0 11/30/2022 1946   GLUCOSEU NEGATIVE 11/30/2022 1946   GLUCOSEU Negative 10/12/2013 0500   GLUCOSEU NEGATIVE 04/25/2012 0949   HGBUR SMALL (A) 11/30/2022 1946   BILIRUBINUR SMALL (A) 11/30/2022 1946   BILIRUBINUR Negative 10/12/2013 0500   KETONESUR NEGATIVE 11/30/2022 1946   PROTEINUR NEGATIVE 11/30/2022 1946   UROBILINOGEN 0.2 04/25/2012 0949   NITRITE POSITIVE (A) 11/30/2022 1946   LEUKOCYTESUR TRACE (A) 11/30/2022 1946   LEUKOCYTESUR 1+ 10/12/2013 0500    FURTHER DISCHARGE INSTRUCTIONS:   Get Medicines reviewed and adjusted: Please take all your medications with you for your next visit with your Primary MD   Laboratory/radiological data: Please request your Primary MD to go over all hospital tests and procedure/radiological results at the follow up,  please ask your Primary MD to get all Hospital records sent to his/her office.   In some cases, they will be blood work, cultures and biopsy results pending at the time of your discharge. Please request that your primary care M.D. goes through all the records of your hospital data and follows up on these results.   Also Note the following: If you experience worsening of  your admission symptoms, develop shortness of breath, life threatening emergency, suicidal or homicidal thoughts you must seek medical attention immediately by calling 911 or calling your MD immediately  if symptoms less severe.   You must read complete instructions/literature along with all the possible adverse reactions/side effects for all the Medicines you take and that have been prescribed to you. Take any new Medicines after you have completely understood and accpet all the possible adverse reactions/side effects.    Do not drive when taking Pain medications or sleeping medications (Benzodaizepines)   Do not take more than prescribed Pain, Sleep and Anxiety Medications. It is not advisable to combine anxiety,sleep and pain medications without talking with your primary care practitioner   Special Instructions: If you have smoked or chewed Tobacco  in the last 2 yrs please stop smoking, stop any regular Alcohol  and or any Recreational drug use.   Wear Seat belts while driving.   Please note: You were cared for by a hospitalist during your hospital stay. Once you are discharged, your primary care physician will handle any further medical issues. Please note that NO REFILLS for any discharge medications will be authorized once you are discharged, as it is imperative that you return to your primary care physician (or establish a relationship with a primary care physician if you do not have one) for your post hospital discharge needs so that they can reassess your need for medications and monitor your lab values.  Time  coordinating discharge: 35 minutes  SIGNED:  Pamella Pert, MD, PhD 12/03/2022, 10:07 AM

## 2022-12-03 NOTE — Progress Notes (Signed)
Subjective: No acute events.  Feeling well since the ERCP.  Objective: Vital signs in last 24 hours: Temp:  [97.6 F (36.4 C)-98.4 F (36.9 C)] 98 F (36.7 C) (09/29 0521) Pulse Rate:  [57-67] 58 (09/29 0521) Resp:  [17-19] 18 (09/28 1850) BP: (89-151)/(51-103) 93/55 (09/29 0521) SpO2:  [95 %-100 %] 99 % (09/29 0521) Last BM Date : 12/01/22  Intake/Output from previous day: 09/28 0701 - 09/29 0700 In: 528.9 [I.V.:500; IV Piggyback:28.9] Out: -  Intake/Output this shift: No intake/output data recorded.  General appearance: alert and no distress GI: soft, non-tender; bowel sounds normal; no masses,  no organomegaly  Lab Results: Recent Labs    11/30/22 1136 12/01/22 0713 12/02/22 0308  WBC 13.5* 11.1* 11.8*  HGB 13.0 12.7 10.8*  HCT 37.6 37.5 32.1*  PLT 338 335 285   BMET Recent Labs    11/30/22 1136 12/01/22 0713 12/02/22 0308  NA 130* 136 133*  K 3.9 4.2 3.9  CL 94* 103 102  CO2 24 24 24   GLUCOSE 94 116* 134*  BUN 14 14 13   CREATININE 0.48 0.72 0.87  CALCIUM 8.7* 8.5* 8.5*   LFT Recent Labs    11/30/22 1136 12/01/22 0713 12/02/22 0308  PROT 7.1   < > 5.9*  ALBUMIN 2.4*   < > 1.9*  AST 76*   < > 36  ALT 45*   < > 28  ALKPHOS 398*   < > 282*  BILITOT 8.1*   < > 3.5*  BILIDIR 4.3*  --   --    < > = values in this interval not displayed.   PT/INR Recent Labs    11/30/22 1523 12/01/22 0257  LABPROT 20.8* 19.5*  INR 1.8* 1.6*   Hepatitis Panel Recent Labs    12/01/22 0713  HEPBSAG NON REACTIVE  HCVAB NON REACTIVE  HEPAIGM NON REACTIVE  HEPBIGM NON REACTIVE   C-Diff Recent Labs    12/01/22 0652  CDIFFTOX NEGATIVE   Fecal Lactopherrin No results for input(s): "FECLLACTOFRN" in the last 72 hours.  Studies/Results: DG ERCP  Result Date: 12/02/2022 CLINICAL DATA:  ERCP. EXAM: ERCP TECHNIQUE: Multiple spot images obtained with the fluoroscopic device and submitted for interpretation post-procedure. FLUOROSCOPY TIME: FLUOROSCOPY TIME 2  minutes, 2 seconds (39.5 mGy) COMPARISON:  MRCP-12/01/2022; CT abdomen pelvis-11/30/2022 FINDINGS: Four spot intraoperative fluoroscopic images of the right upper abdominal quadrant during ERCP are provided for review. Initial image demonstrates an ERCP probe overlying the right upper abdominal quadrant. Surgical clips overlies expected location of the gallbladder fossa. Surgical mesh overlies the midline of the abdomen. There is selective cannulation and opacification of the common bile duct which appears moderate to markedly dilated. Subsequent images demonstrate insufflation of a balloon within distal aspect of the CBD with presumed biliary sweeping and sphincterotomy. There is minimal opacification of the intrahepatic biliary tree which appears mildly dilated. There is no definitive opacification of the cystic or pancreatic ducts. IMPRESSION: ERCP with biliary sweeping and presumed sphincterotomy as above. These images were submitted for radiologic interpretation only. Please see the procedural report for the amount of contrast and the fluoroscopy time utilized. Electronically Signed   By: Simonne Come M.D.   On: 12/02/2022 15:17   MR ABDOMEN MRCP W WO CONTAST  Result Date: 12/01/2022 CLINICAL DATA:  Biliary dilatation. Sepsis. Status post cholecystectomy. EXAM: MRI ABDOMEN WITHOUT AND WITH CONTRAST (INCLUDING MRCP) TECHNIQUE: Multiplanar multisequence MR imaging of the abdomen was performed both before and after the administration of intravenous contrast.  Heavily T2-weighted images of the biliary and pancreatic ducts were obtained, and three-dimensional MRCP images were rendered by post processing. CONTRAST:  10mL GADAVIST GADOBUTROL 1 MMOL/ML IV SOLN COMPARISON:  CT abdomen/pelvis 11/30/2022 FINDINGS: Lower chest: Small bilateral effusions. Hepatobiliary: No suspicious focal abnormality within the liver parenchyma. Gallbladder surgically absent. Common duct measures 7 mm in the hepatoduodenal ligament.  Common bile duct in the head of the pancreas measures 8 mm diameter, similar to prior CT. 4 x 6 mm filling defect is identified in the distal common bile duct just proximal to the ampulla. This is favored to be a stone over an air bubble given that it is in a dependent position within the duct and on coronal thin MRCP imaging has angular contour (see image 8 of series 17). Pancreas: Multiple well-defined homogeneous small cystic lesions are identified in the pancreas, most of which are clustered in the pancreatic head region measuring up to about 12 mm. 4 mm cystic lesion identified in the body of the pancreas. No main duct dilatation. Spleen:  No splenomegaly. No suspicious focal mass lesion. Adrenals/Urinary Tract: No adrenal nodule or mass. Several tiny cysts are seen in the right kidney. Left kidney unremarkable. Stomach/Bowel: Tiny hiatal hernia. No gastric wall thickening. No evidence of outlet obstruction. Duodenum is normally positioned as is the ligament of Treitz. No small bowel or colonic dilatation within the visualized abdomen. Vascular/Lymphatic: No abdominal aortic aneurysm. No abdominal aortic atherosclerotic calcification. There is no gastrohepatic or hepatoduodenal ligament lymphadenopathy. No retroperitoneal or mesenteric lymphadenopathy. Other:  No intraperitoneal free fluid. Musculoskeletal: No focal suspicious marrow enhancement within the visualized bony anatomy. IMPRESSION: 1. 4 x 6 mm filling defect in the distal common bile duct just proximal to the ampulla. This is favored to be a stone over an air bubble given that it is in a dependent position within the duct and on coronal thin MRCP imaging has an angular contour. 2. Multiple well-defined homogeneous small cystic lesions in the pancreas, most of which are clustered in the pancreatic head region measuring up to about 12 mm. Imaging features are most suggestive of benign etiology such as pseudocysts or indolent cystic neoplasm/side branch  IPMN. Follow-up MRI in 6-12 months recommended to ensure stability. 3. Small bilateral effusions. 4. Tiny hiatal hernia. Electronically Signed   By: Kennith Center M.D.   On: 12/01/2022 16:12   MR 3D Recon At Scanner  Result Date: 12/01/2022 CLINICAL DATA:  Biliary dilatation. Sepsis. Status post cholecystectomy. EXAM: MRI ABDOMEN WITHOUT AND WITH CONTRAST (INCLUDING MRCP) TECHNIQUE: Multiplanar multisequence MR imaging of the abdomen was performed both before and after the administration of intravenous contrast. Heavily T2-weighted images of the biliary and pancreatic ducts were obtained, and three-dimensional MRCP images were rendered by post processing. CONTRAST:  10mL GADAVIST GADOBUTROL 1 MMOL/ML IV SOLN COMPARISON:  CT abdomen/pelvis 11/30/2022 FINDINGS: Lower chest: Small bilateral effusions. Hepatobiliary: No suspicious focal abnormality within the liver parenchyma. Gallbladder surgically absent. Common duct measures 7 mm in the hepatoduodenal ligament. Common bile duct in the head of the pancreas measures 8 mm diameter, similar to prior CT. 4 x 6 mm filling defect is identified in the distal common bile duct just proximal to the ampulla. This is favored to be a stone over an air bubble given that it is in a dependent position within the duct and on coronal thin MRCP imaging has angular contour (see image 8 of series 17). Pancreas: Multiple well-defined homogeneous small cystic lesions are identified in the pancreas, most  of which are clustered in the pancreatic head region measuring up to about 12 mm. 4 mm cystic lesion identified in the body of the pancreas. No main duct dilatation. Spleen:  No splenomegaly. No suspicious focal mass lesion. Adrenals/Urinary Tract: No adrenal nodule or mass. Several tiny cysts are seen in the right kidney. Left kidney unremarkable. Stomach/Bowel: Tiny hiatal hernia. No gastric wall thickening. No evidence of outlet obstruction. Duodenum is normally positioned as is the  ligament of Treitz. No small bowel or colonic dilatation within the visualized abdomen. Vascular/Lymphatic: No abdominal aortic aneurysm. No abdominal aortic atherosclerotic calcification. There is no gastrohepatic or hepatoduodenal ligament lymphadenopathy. No retroperitoneal or mesenteric lymphadenopathy. Other:  No intraperitoneal free fluid. Musculoskeletal: No focal suspicious marrow enhancement within the visualized bony anatomy. IMPRESSION: 1. 4 x 6 mm filling defect in the distal common bile duct just proximal to the ampulla. This is favored to be a stone over an air bubble given that it is in a dependent position within the duct and on coronal thin MRCP imaging has an angular contour. 2. Multiple well-defined homogeneous small cystic lesions in the pancreas, most of which are clustered in the pancreatic head region measuring up to about 12 mm. Imaging features are most suggestive of benign etiology such as pseudocysts or indolent cystic neoplasm/side branch IPMN. Follow-up MRI in 6-12 months recommended to ensure stability. 3. Small bilateral effusions. 4. Tiny hiatal hernia. Electronically Signed   By: Kennith Center M.D.   On: 12/01/2022 16:12    Medications: Scheduled:  acidophilus  1 capsule Oral Daily   atorvastatin  10 mg Oral Daily   furosemide  20 mg Oral Daily   Gerhardt's butt cream   Topical BID   levothyroxine  125 mcg Oral QAC breakfast   mometasone-formoterol  2 puff Inhalation BID   nystatin  1 Application Topical TID   pantoprazole  40 mg Oral Daily   potassium chloride SA  20 mEq Oral Daily   Continuous:  piperacillin-tazobactam (ZOSYN)  IV 3.375 g (12/03/22 0311)    Assessment/Plan: 1) Choledocholithiasis s/p ERCP with stone extraction. 2) Abnormal liver enzymes - pending for this AM.   The patient is clinically stable.  She is tolerating a regular diet.  Plan: 1) Okay to transfer back to long term care facility.  LOS: 1 day   [name] D 12/03/2022, 7:51 AM

## 2022-12-03 NOTE — TOC Transition Note (Signed)
Transition of Care [facility]) - CM/SW Discharge Note   Patient Details  Name: [name] MRN: [id number] Date of Birth: 02/18/1941  [facility]) CM/SW Contact:  Helene Kelp, LCSW Phone Number: 12/03/2022, 3:20 PM   Clinical Narrative:    CSW followed-up the patient's disposition (return to facility). CSW was contacted by provider (via chat) to facilitate the patient's return back to the facility (SNF).  CSW met with the patient at the bedside and reviewed their current disposition (return to facility). Patient noted    CSW contacted the patient's facility ([facility]) and spoke with the representative (Tiffany). CSW provided update informtion regarding the patient's current status and reviewed discharge process for the patient's return to the facility. Facility rep: Garment/textile technologist) stated heir social worker is not there, but they cannot refuse the pt and would accept the patient on return from the hospital.   CSW contacted the patient's natural support (Dorminey,Lowell [phone] & Labelle,Neil [phone] ). Neither son could be reached.   CSW updated  the patient's current disposition and when transport to the facility will take place.   CSW contacted [facility] and arranged transport to the facility ( [facility]). CSW spoke with [facility] representative (Tiffany) and transport was arranged for the pt to return to their facility..   Nurse Report: [phone] room 21  CSW updated the clinical team members on the floor to efforts made to facilitate pt's disposition ( ) and provided [facility] transport date and time of transport.  Date: 12/02/22 Time: 4:30 PM  CSW provided CN with discharge packet and information for the patient to transfer.   CSW updated the patient to the information above.   No other needs identified or requested of this writer at this current time. Please consult TOC if new need arises.   Final next level of care: Other  (comment) ([facility]) Barriers to Discharge: Continued Medical Work up   Patient Goals and CMS Choice   Choice offered to / list presented to : Adult Children, Parent  Discharge Placement                  Patient to be transferred to facility by: EMS non-emergency Name of family member notified: Michaele, Amundson [phone] & Dupee,Neil [phone] Patient and family notified of of transfer: 12/03/22  Discharge Plan and Services Additional resources added to the After Visit Summary for                                       Social Determinants of Health (SDOH) Interventions SDOH Screenings   Food Insecurity: No Food Insecurity (12/02/2022)  Housing: Low Risk  (12/02/2022)  Transportation Needs: No Transportation Needs (12/02/2022)  Utilities: Not At Risk (12/02/2022)  Tobacco Use: Low Risk  (12/02/2022)     Readmission Risk Interventions     No data to display

## 2022-12-03 NOTE — Progress Notes (Signed)
PT received bed bath, redness noted under bilateral breast and groin area , Antifungal powder applied. Complete bed bath given and lotion applied, refused to use shower cap to wash hair. Feet washed and lotion applied.  Refused to change socks and apply hospital supplied non skid socks. Pt states she left her glasses at this facility were she lives so this nurse helped place her breakfast order. Bed in lowest position , HOB elevated and call bell within reach , will continue to monitor

## 2022-12-03 NOTE — Progress Notes (Signed)
PT Cancellation Note  Patient Details Name: [name] [facility] MRN: [id number] DOB: 1941/01/01   Cancelled Treatment:    Reason Eval/Treat Not Completed: Other (comment)  PT orders received but pt with d/c order in place and d/c summary in notes.  Spoke with RN and pt is returning to SNF and does not need therapy note to d/c.  Will f/u tomorrow if pt still admitted.  Anise Salvo, PT Acute Rehab [facility] [phone]  Rayetta Humphrey 12/03/2022, 2:25 PM

## 2022-12-03 NOTE — TOC Initial Note (Signed)
Transition of Care [facility]) - Initial/Assessment Note    Patient Details  Name: [name] MRN: [id number] Date of Birth: June 22, 1940  Transition of Care [facility]) CM/SW Contact:    Ronny Bacon, RN Phone Number: 12/03/2022, 2:44 PM  Clinical Narrative:  [phone]: Received message from provider to inquire if patient could return to her SNF today, added CSW to chat. [phone]: Provider followed to up to see if patient could return to her SNF today. [phone]: Patient is from [facility] and resides on [address]. Phone call made to [facility] in attempt to see if patient could return today. Spoke with agency nurse there, she did not know the protocol on patients returning to the facility after a hospital admission. She spoke with Kathlene November, unit supervisor, who had to call unit manager and was going to call back when they had an answer. [phone]: No return call received, called facility back and spoke with Elmarie Shiley, a different agency nurse, reports unit manager never called back to give the ok to accept patient today and she does not feel patient can return today without talking to social worker for facility. Provider and CSW made aware.                     Patient Goals and CMS Choice            Expected Discharge Plan and Services         Expected Discharge Date: 12/03/22                                    Prior Living Arrangements/Services                       Activities of Daily Living   ADL Screening (condition at time of admission) Does the patient have a NEW difficulty with bathing/dressing/toileting/self-feeding that is expected to last >3 days?: No Does the patient have a NEW difficulty with getting in/out of bed, walking, or climbing stairs that is expected to last >3 days?: No Does the patient have a NEW difficulty with communication that is expected to last >3 days?: No Is the patient deaf or have difficulty hearing?: No Does the patient have difficulty seeing,  even when wearing glasses/contacts?: No Does the patient have difficulty concentrating, remembering, or making decisions?: No  Permission Sought/Granted                  Emotional Assessment              Admission diagnosis:  Choledocholithiasis [K80.50] Patient Active Problem List   Diagnosis Date Noted   Diarrhea 12/02/2022   Choledocholithiasis 12/01/2022   Abdominal pain 11/30/2022   Sepsis secondary to UTI (HCC) 11/30/2022   COPD (chronic obstructive pulmonary disease) (HCC)    Chronic diastolic CHF (congestive heart failure) (HCC) 05/05/2020   Atrial fibrillation (HCC)    Acute CHF (congestive heart failure) (HCC) 08/22/2019   Community acquired pneumonia 05/26/2016   Pneumonia 05/26/2016   Pressure injury of skin 05/26/2016   Pressure ulcer 03/24/2015   Cellulitis 03/23/2015   Vaginal atrophy 08/19/2014   Asthma, chronic 04/18/2012   Obstructive sleep apnea 04/18/2012   Essential hypertension, benign 04/18/2012   Hypercholesterolemia 04/18/2012   Critical aortic valve stenosis 04/18/2012   Endometrial cancer (HCC) 04/18/2012   Diverticulosis of colon without hemorrhage 04/18/2012   Hypothyroidism 04/18/2012  Overactive bladder 04/18/2012   PCP:  Pcp, No Pharmacy:   [id number] [place], [place] - [address] AT [facility] [address] [place] [address] Phone: [phone] Fax: [phone]     Social Determinants of Health (SDOH) Social History: SDOH Screenings   Food Insecurity: No Food Insecurity (12/02/2022)  Housing: Low Risk  (12/02/2022)  Transportation Needs: No Transportation Needs (12/02/2022)  Utilities: Not At Risk (12/02/2022)  Tobacco Use: Low Risk  (12/02/2022)   SDOH Interventions:     Readmission Risk Interventions     No data to display

## 2022-12-03 NOTE — Progress Notes (Signed)
RN called [facility] at [employer] and gave report to the nurse and she stated understanding. IV has been removed and the patient is waiting for [facility]

## 2022-12-04 ENCOUNTER — Encounter (HOSPITAL_COMMUNITY): Payer: Self-pay | Admitting: Gastroenterology

## 2022-12-05 LAB — CULTURE, BLOOD (SINGLE): Culture: NO GROWTH
